# Patient Record
Sex: Female | Born: 1935 | Race: White | Hispanic: No | Smoking: Former smoker
Health system: Southern US, Community
[De-identification: ages and names within clinical notes are randomized; demographics above are authoritative.]

## PROBLEM LIST (undated history)

## (undated) DIAGNOSIS — K219 Gastro-esophageal reflux disease without esophagitis: Secondary | ICD-10-CM

## (undated) DIAGNOSIS — T7840XA Allergy, unspecified, initial encounter: Secondary | ICD-10-CM

## (undated) DIAGNOSIS — I10 Essential (primary) hypertension: Secondary | ICD-10-CM

## (undated) DIAGNOSIS — I8390 Asymptomatic varicose veins of unspecified lower extremity: Secondary | ICD-10-CM

## (undated) DIAGNOSIS — E785 Hyperlipidemia, unspecified: Secondary | ICD-10-CM

## (undated) HISTORY — DX: Allergy, unspecified, initial encounter: T78.40XA

## (undated) HISTORY — DX: Gastro-esophageal reflux disease without esophagitis: K21.9

## (undated) HISTORY — PX: TONSILLECTOMY: SUR1361

## (undated) HISTORY — DX: Essential (primary) hypertension: I10

## (undated) HISTORY — DX: Hyperlipidemia, unspecified: E78.5

## (undated) HISTORY — PX: APPENDECTOMY: SHX54

## (undated) HISTORY — DX: Asymptomatic varicose veins of unspecified lower extremity: I83.90

---

## 2004-08-19 ENCOUNTER — Ambulatory Visit: Payer: Self-pay | Admitting: Family Medicine

## 2004-12-23 ENCOUNTER — Ambulatory Visit: Payer: Self-pay | Admitting: Family Medicine

## 2005-01-23 ENCOUNTER — Ambulatory Visit: Payer: Self-pay | Admitting: Family Medicine

## 2005-05-26 ENCOUNTER — Ambulatory Visit: Payer: Self-pay | Admitting: Family Medicine

## 2005-05-29 ENCOUNTER — Ambulatory Visit: Payer: Self-pay | Admitting: Family Medicine

## 2005-06-09 ENCOUNTER — Ambulatory Visit: Payer: Self-pay | Admitting: Family Medicine

## 2005-06-13 ENCOUNTER — Ambulatory Visit: Payer: Self-pay | Admitting: *Deleted

## 2006-08-09 ENCOUNTER — Ambulatory Visit: Payer: Self-pay | Admitting: Family Medicine

## 2006-08-09 LAB — CONVERTED CEMR LAB
ALT: 41 units/L — ABNORMAL HIGH (ref 0–40)
AST: 33 units/L (ref 0–37)
Albumin: 4.1 g/dL (ref 3.5–5.2)
Alkaline Phosphatase: 44 units/L (ref 39–117)
BUN: 18 mg/dL (ref 6–23)
Basophils Absolute: 0 10*3/uL (ref 0.0–0.1)
Basophils Relative: 0.6 % (ref 0.0–1.0)
Bilirubin, Direct: 0.1 mg/dL (ref 0.0–0.3)
CO2: 33 meq/L — ABNORMAL HIGH (ref 19–32)
Calcium: 9.7 mg/dL (ref 8.4–10.5)
Chloride: 97 meq/L (ref 96–112)
Creatinine, Ser: 0.7 mg/dL (ref 0.4–1.2)
Eosinophil percent: 3.1 % (ref 0.0–5.0)
GFR calc non Af Amer: 86 mL/min
Glomerular Filtration Rate, Af Am: 105 mL/min/{1.73_m2}
Glucose, Bld: 100 mg/dL — ABNORMAL HIGH (ref 70–99)
HCT: 42.9 % (ref 36.0–46.0)
Hemoglobin: 14.3 g/dL (ref 12.0–15.0)
Lymphocytes Relative: 36.5 % (ref 12.0–46.0)
MCHC: 33.4 g/dL (ref 30.0–36.0)
MCV: 93.6 fL (ref 78.0–100.0)
Monocytes Absolute: 0.4 10*3/uL (ref 0.2–0.7)
Monocytes Relative: 7.6 % (ref 3.0–11.0)
Neutro Abs: 3.1 10*3/uL (ref 1.4–7.7)
Neutrophils Relative %: 52.2 % (ref 43.0–77.0)
Platelets: 292 10*3/uL (ref 150–400)
Potassium: 3.9 meq/L (ref 3.5–5.1)
RBC: 4.58 M/uL (ref 3.87–5.11)
RDW: 12.8 % (ref 11.5–14.6)
Sodium: 139 meq/L (ref 135–145)
TSH: 2.76 microintl units/mL (ref 0.35–5.50)
Total Bilirubin: 0.8 mg/dL (ref 0.3–1.2)
Total Protein: 7.4 g/dL (ref 6.0–8.3)
WBC: 5.8 10*3/uL (ref 4.5–10.5)

## 2006-12-18 ENCOUNTER — Ambulatory Visit: Payer: Self-pay | Admitting: Family Medicine

## 2006-12-27 ENCOUNTER — Ambulatory Visit: Payer: Self-pay | Admitting: Family Medicine

## 2007-01-17 ENCOUNTER — Ambulatory Visit: Payer: Self-pay | Admitting: Family Medicine

## 2007-06-11 DIAGNOSIS — I10 Essential (primary) hypertension: Secondary | ICD-10-CM | POA: Insufficient documentation

## 2007-06-11 DIAGNOSIS — K219 Gastro-esophageal reflux disease without esophagitis: Secondary | ICD-10-CM | POA: Insufficient documentation

## 2007-09-16 ENCOUNTER — Ambulatory Visit: Payer: Self-pay | Admitting: Family Medicine

## 2007-09-18 LAB — CONVERTED CEMR LAB
ALT: 26 units/L (ref 0–35)
AST: 27 units/L (ref 0–37)
Albumin: 3.9 g/dL (ref 3.5–5.2)
Alkaline Phosphatase: 49 units/L (ref 39–117)
Amylase: 122 units/L (ref 27–131)
BUN: 18 mg/dL (ref 6–23)
Basophils Absolute: 0 10*3/uL (ref 0.0–0.1)
Basophils Relative: 0.5 % (ref 0.0–1.0)
Bilirubin, Direct: 0.2 mg/dL (ref 0.0–0.3)
CO2: 31 meq/L (ref 19–32)
Calcium: 9.5 mg/dL (ref 8.4–10.5)
Chloride: 97 meq/L (ref 96–112)
Creatinine, Ser: 0.8 mg/dL (ref 0.4–1.2)
Eosinophils Absolute: 0.2 10*3/uL (ref 0.0–0.6)
Eosinophils Relative: 3 % (ref 0.0–5.0)
GFR calc Af Amer: 89 mL/min
GFR calc non Af Amer: 74 mL/min
Glucose, Bld: 97 mg/dL (ref 70–99)
HCT: 39.5 % (ref 36.0–46.0)
Hemoglobin: 13.7 g/dL (ref 12.0–15.0)
Lymphocytes Relative: 29.3 % (ref 12.0–46.0)
MCHC: 34.7 g/dL (ref 30.0–36.0)
MCV: 94.3 fL (ref 78.0–100.0)
Monocytes Absolute: 0.5 10*3/uL (ref 0.2–0.7)
Monocytes Relative: 7 % (ref 3.0–11.0)
Neutro Abs: 4.1 10*3/uL (ref 1.4–7.7)
Neutrophils Relative %: 60.2 % (ref 43.0–77.0)
Platelets: 270 10*3/uL (ref 150–400)
Potassium: 4.2 meq/L (ref 3.5–5.1)
RBC: 4.19 M/uL (ref 3.87–5.11)
RDW: 12.9 % (ref 11.5–14.6)
Sodium: 138 meq/L (ref 135–145)
TSH: 2.06 microintl units/mL (ref 0.35–5.50)
Total Bilirubin: 0.8 mg/dL (ref 0.3–1.2)
Total Protein: 6.8 g/dL (ref 6.0–8.3)
WBC: 6.8 10*3/uL (ref 4.5–10.5)

## 2008-01-22 ENCOUNTER — Encounter: Payer: Self-pay | Admitting: Family Medicine

## 2008-01-29 ENCOUNTER — Ambulatory Visit: Payer: Self-pay | Admitting: Family Medicine

## 2008-01-29 DIAGNOSIS — R609 Edema, unspecified: Secondary | ICD-10-CM | POA: Insufficient documentation

## 2008-03-04 ENCOUNTER — Telehealth: Payer: Self-pay | Admitting: Family Medicine

## 2008-05-06 ENCOUNTER — Ambulatory Visit: Payer: Self-pay | Admitting: Family Medicine

## 2008-05-06 DIAGNOSIS — J309 Allergic rhinitis, unspecified: Secondary | ICD-10-CM | POA: Insufficient documentation

## 2008-08-25 ENCOUNTER — Ambulatory Visit: Payer: Self-pay | Admitting: Family Medicine

## 2008-11-26 LAB — HM COLONOSCOPY

## 2009-03-15 ENCOUNTER — Ambulatory Visit: Payer: Self-pay | Admitting: Family Medicine

## 2009-03-17 ENCOUNTER — Encounter: Payer: Self-pay | Admitting: Family Medicine

## 2009-03-17 LAB — CONVERTED CEMR LAB
ALT: 25 units/L (ref 0–35)
AST: 28 units/L (ref 0–37)
Albumin: 4.3 g/dL (ref 3.5–5.2)
Alkaline Phosphatase: 53 units/L (ref 39–117)
BUN: 21 mg/dL (ref 6–23)
Basophils Absolute: 0.1 10*3/uL (ref 0.0–0.1)
Basophils Relative: 0.9 % (ref 0.0–3.0)
Bilirubin, Direct: 0.1 mg/dL (ref 0.0–0.3)
CO2: 31 meq/L (ref 19–32)
Calcium: 9.3 mg/dL (ref 8.4–10.5)
Chloride: 94 meq/L — ABNORMAL LOW (ref 96–112)
Creatinine, Ser: 0.9 mg/dL (ref 0.4–1.2)
Eosinophils Absolute: 0.3 10*3/uL (ref 0.0–0.7)
Eosinophils Relative: 4.9 % (ref 0.0–5.0)
GFR calc non Af Amer: 64.16 mL/min (ref >60)
Glucose, Bld: 100 mg/dL — ABNORMAL HIGH (ref 70–99)
HCT: 38.7 % (ref 36.0–46.0)
Hemoglobin: 13.9 g/dL (ref 12.0–15.0)
Lymphocytes Relative: 33.6 % (ref 12.0–46.0)
Lymphs Abs: 1.9 10*3/uL (ref 0.7–4.0)
MCHC: 36 g/dL (ref 30.0–36.0)
MCV: 89.1 fL (ref 78.0–100.0)
Monocytes Absolute: 0.4 10*3/uL (ref 0.1–1.0)
Monocytes Relative: 6.6 % (ref 3.0–12.0)
Neutro Abs: 3 10*3/uL (ref 1.4–7.7)
Neutrophils Relative %: 54 % (ref 43.0–77.0)
Platelets: 205 10*3/uL (ref 150.0–400.0)
Potassium: 3.7 meq/L (ref 3.5–5.1)
RBC: 4.34 M/uL (ref 3.87–5.11)
RDW: 13.5 % (ref 11.5–14.6)
Sodium: 131 meq/L — ABNORMAL LOW (ref 135–145)
TSH: 2.62 microintl units/mL (ref 0.35–5.50)
Total Bilirubin: 1.2 mg/dL (ref 0.3–1.2)
Total Protein: 7.5 g/dL (ref 6.0–8.3)
WBC: 5.7 10*3/uL (ref 4.5–10.5)

## 2009-06-02 ENCOUNTER — Encounter: Payer: Self-pay | Admitting: Family Medicine

## 2009-10-11 ENCOUNTER — Telehealth: Payer: Self-pay | Admitting: Family Medicine

## 2009-10-19 ENCOUNTER — Telehealth: Payer: Self-pay | Admitting: Family Medicine

## 2009-11-09 ENCOUNTER — Ambulatory Visit: Payer: Self-pay | Admitting: Family Medicine

## 2010-01-14 ENCOUNTER — Telehealth: Payer: Self-pay | Admitting: Family Medicine

## 2010-03-15 ENCOUNTER — Encounter: Payer: Self-pay | Admitting: Family Medicine

## 2010-04-07 ENCOUNTER — Telehealth: Payer: Self-pay | Admitting: Family Medicine

## 2010-04-19 ENCOUNTER — Encounter: Payer: Self-pay | Admitting: Family Medicine

## 2010-06-27 ENCOUNTER — Ambulatory Visit: Payer: Self-pay | Admitting: Family Medicine

## 2010-06-27 DIAGNOSIS — M129 Arthropathy, unspecified: Secondary | ICD-10-CM | POA: Insufficient documentation

## 2010-06-28 LAB — CONVERTED CEMR LAB
ALT: 20 units/L (ref 0–35)
AST: 22 units/L (ref 0–37)
Albumin: 4.2 g/dL (ref 3.5–5.2)
Alkaline Phosphatase: 52 units/L (ref 39–117)
BUN: 13 mg/dL (ref 6–23)
Basophils Absolute: 0 10*3/uL (ref 0.0–0.1)
Basophils Relative: 0.6 % (ref 0.0–3.0)
Bilirubin, Direct: 0.2 mg/dL (ref 0.0–0.3)
CO2: 32 meq/L (ref 19–32)
Calcium: 9.4 mg/dL (ref 8.4–10.5)
Chloride: 96 meq/L (ref 96–112)
Creatinine, Ser: 0.7 mg/dL (ref 0.4–1.2)
Eosinophils Absolute: 0.2 10*3/uL (ref 0.0–0.7)
Eosinophils Relative: 3.4 % (ref 0.0–5.0)
GFR calc non Af Amer: 81.43 mL/min (ref >60)
Glucose, Bld: 104 mg/dL — ABNORMAL HIGH (ref 70–99)
HCT: 41 % (ref 36.0–46.0)
Hemoglobin: 13.9 g/dL (ref 12.0–15.0)
Lymphocytes Relative: 30.1 % (ref 12.0–46.0)
Lymphs Abs: 2 10*3/uL (ref 0.7–4.0)
MCHC: 34 g/dL (ref 30.0–36.0)
MCV: 94 fL (ref 78.0–100.0)
Monocytes Absolute: 0.5 10*3/uL (ref 0.1–1.0)
Monocytes Relative: 7.4 % (ref 3.0–12.0)
Neutro Abs: 3.9 10*3/uL (ref 1.4–7.7)
Neutrophils Relative %: 58.5 % (ref 43.0–77.0)
Platelets: 260 10*3/uL (ref 150.0–400.0)
Potassium: 4 meq/L (ref 3.5–5.1)
RBC: 4.36 M/uL (ref 3.87–5.11)
RDW: 14.6 % (ref 11.5–14.6)
Sodium: 136 meq/L (ref 135–145)
TSH: 2.51 microintl units/mL (ref 0.35–5.50)
Total Bilirubin: 0.8 mg/dL (ref 0.3–1.2)
Total Protein: 7 g/dL (ref 6.0–8.3)
Uric Acid, Serum: 5.9 mg/dL (ref 2.4–7.0)
WBC: 6.7 10*3/uL (ref 4.5–10.5)

## 2010-07-12 ENCOUNTER — Telehealth: Payer: Self-pay | Admitting: Family Medicine

## 2010-08-16 ENCOUNTER — Ambulatory Visit: Payer: Self-pay | Admitting: Family Medicine

## 2010-08-31 ENCOUNTER — Telehealth: Payer: Self-pay | Admitting: Family Medicine

## 2010-08-31 DIAGNOSIS — M25579 Pain in unspecified ankle and joints of unspecified foot: Secondary | ICD-10-CM | POA: Insufficient documentation

## 2010-09-09 ENCOUNTER — Encounter: Payer: Self-pay | Admitting: Family Medicine

## 2010-10-10 ENCOUNTER — Telehealth: Payer: Self-pay | Admitting: Family Medicine

## 2010-10-11 ENCOUNTER — Telehealth: Payer: Self-pay | Admitting: Family Medicine

## 2010-10-24 ENCOUNTER — Telehealth: Payer: Self-pay | Admitting: Family Medicine

## 2010-11-01 NOTE — Progress Notes (Signed)
Summary: RX  Phone Note Call from Patient   Reason for Call: Talk to Nurse Summary of Call: Patient is requesting something to be called in for same syptoms as her last visit.  (sneezing,watery eyes,dripping sinus).  Pharmacy is [facility] on [address].  NKA Initial call taken by: Everrett Coombe,  July 12, 2010 9:53 AM  Follow-up for Phone Call        call in Augmentin 875 two times a day for 10 days  Follow-up by: Nelwyn Salisbury MD,  July 12, 2010 10:02 AM  Additional Follow-up for Phone Call Additional follow up Details #1::        done  pt aware  Additional Follow-up by: Pura Spice, RN,  July 12, 2010 10:32 AM    New/Updated Medications: AUGMENTIN 875-125 MG TABS (AMOXICILLIN-POT CLAVULANATE) 1 by mouth two times a day for 10 days Prescriptions: AUGMENTIN 875-125 MG TABS (AMOXICILLIN-POT CLAVULANATE) 1 by mouth two times a day for 10 days  #20 x 0   Entered by:   Pura Spice, RN   Authorized by:   Nelwyn Salisbury MD   Signed by:   Pura Spice, RN on 07/12/2010   Method used:   Electronically to        [facility]  [phone]* (retail)       [address]       [place], [place]  [address]       Ph: [phone] or [phone]       Fax: [phone]   RxID:   [id number]

## 2010-11-01 NOTE — Letter (Signed)
Summary: [facility]  [facility]   Imported By: Maryln Gottron 07/09/2009 10:48:47  _____________________________________________________________________  External Attachment:    Type:   Image     Comment:   External Document

## 2010-11-01 NOTE — Assessment & Plan Note (Signed)
Summary: fu on bp/njr  Medications Added NEXIUM 40 MG  CPDR (ESOMEPRAZOLE MAGNESIUM) 1 by mouth once daily BENICAR HCT 40-12.5 MG  TABS (OLMESARTAN MEDOXOMIL-HCTZ) 1 by mouth once daily CRESTOR 10 MG  TABS (ROSUVASTATIN CALCIUM) 1 by mouth once daily ATENOLOL 100 MG  TABS (ATENOLOL) 1 by mouth once daily FUROSEMIDE 40 MG  TABS (FUROSEMIDE) 1 by mouth once daily EVISTA 60 MG  TABS (RALOXIFENE HCL) 1 by mouth once daily KLOR-CON M20 20 MEQ  TBCR (POTASSIUM CHLORIDE CRYS CR) once daily KETOCONAZOLE 2 %  CREA (KETOCONAZOLE) apply two times a day as needed        Vital Signs:  Patient Profile:   77 Years Old Female Weight:      187 pounds Temp:     98.3 degrees F oral Pulse rate:   69 / minute BP sitting:   138 / 82  (left arm) Cuff size:   regular  Vitals Entered By: Alfred Levins, CMA (September 16, 2007 10:28 AM)                 Chief Complaint:  bp check.  History of Present Illness: BP is stable. Having a lot of generalized itching, also some hair loss. Had stopped taking Evista 6 months ago.   Current Allergies: No known allergies      Review of Systems      See HPI   Physical Exam  General:     Well-developed,well-nourished,in no acute distress; alert,appropriate and cooperative throughout examination    Impression & Recommendations:  Problem # 1:  HYPERTENSION (ICD-401.9)  Her updated medication list for this problem includes:    Benicar Hct 40-12.5 Mg Tabs (Olmesartan medoxomil-hctz) .Marland Kitchen... 1 by mouth once daily    Atenolol 100 Mg Tabs (Atenolol) .Marland Kitchen... 1 by mouth once daily    Furosemide 40 Mg Tabs (Furosemide) .Marland Kitchen... 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 1 by mouth once daily   Complete Medication List: 1)  Nexium 40 Mg Cpdr (Esomeprazole magnesium) .Marland Kitchen.. 1 by mouth once daily 2)  Benicar Hct 40-12.5 Mg Tabs (Olmesartan medoxomil-hctz) .Marland Kitchen.. 1 by mouth once daily 3)  Crestor 10 Mg Tabs (Rosuvastatin calcium) .Marland Kitchen.. 1 by mouth once daily 4)  Atenolol 100 Mg Tabs (Atenolol) .Marland Kitchen.. 1 by mouth once daily 5)  Furosemide 40 Mg Tabs (Furosemide) .Marland Kitchen.. 1 by mouth once daily 6)  Klor-con M20 20 Meq Tbcr (Potassium chloride crys cr) .... Once daily 7)  Ketoconazole 2 % Crea (Ketoconazole) .... Apply two times a day as needed   Patient Instructions: 1)  Please schedule a follow-up appointment as needed.    Prescriptions: KLOR-CON M20 20 MEQ  TBCR (POTASSIUM CHLORIDE CRYS CR) once daily  #90 x 3   Entered and Authorized by:   Nelwyn Salisbury MD   Signed by:   Nelwyn Salisbury MD on 09/16/2007   Method used:   Print then Give to Patient   RxID:   [id number] FUROSEMIDE 40 MG  TABS (FUROSEMIDE) 1 by mouth once daily  #90 x 3   Entered and Authorized by:   Nelwyn Salisbury MD   Signed by:  Nelwyn Salisbury MD on 09/16/2007   Method used:   Print then Give to Patient   RxID:   [id number] ATENOLOL 100 MG  TABS (ATENOLOL) 1 by mouth once daily  #90 x 3   Entered and Authorized by:   Nelwyn Salisbury MD   Signed by:   Nelwyn Salisbury MD on 09/16/2007   Method used:   Print then Give to Patient   RxID:   [id number] CRESTOR 10 MG  TABS (ROSUVASTATIN CALCIUM) 1 by mouth once daily  #90 x 3   Entered and Authorized by:   Nelwyn Salisbury MD   Signed by:   Nelwyn Salisbury MD on 09/16/2007   Method used:   Print then Give to Patient   RxID:   [id number] BENICAR HCT 40-12.5 MG  TABS (OLMESARTAN MEDOXOMIL-HCTZ) 1 by mouth once daily  #90 x 3   Entered and Authorized by:   Nelwyn Salisbury MD   Signed  by:   Nelwyn Salisbury MD on 09/16/2007   Method used:   Print then Give to Patient   RxID:   [id number] NEXIUM 40 MG  CPDR (ESOMEPRAZOLE MAGNESIUM) 1 by mouth once daily  #90 x 3   Entered and Authorized by:   Nelwyn Salisbury MD   Signed by:   Nelwyn Salisbury MD on 09/16/2007   Method used:   Print then Give to Patient   RxID:   [id number]  ]

## 2010-11-01 NOTE — Progress Notes (Signed)
Summary: referral to orthropedic   Phone Note Call from Patient Call back at Home Phone [phone]   Caller: Son Summary of Call: she is having more pain and swelling in the left ankle. This has been going on for about 5 months Initial call taken by: Nelwyn Salisbury MD,  August 31, 2010 10:09 AM  Follow-up for Phone Call        refer to Dr. Dorene Grebe for left ankle pain Follow-up by: Nelwyn Salisbury MD,  August 31, 2010 10:09 AM  Additional Follow-up for Phone Call Additional follow up Details #1::        sent to terri  Additional Follow-up by: Pura Spice, RN,  August 31, 2010 11:32 AM  New Problems: ANKLE PAIN (ICD-719.47)   New Problems: ANKLE PAIN (ICD-719.47)

## 2010-11-01 NOTE — Assessment & Plan Note (Signed)
Summary: upper resp infection/mhf   Vital Signs:  Patient Profile:   77 Years Old Female Weight:      188 pounds Temp:     98.2 degrees F oral Pulse rate:   67 / minute BP sitting:   118 / 66  (left arm) Cuff size:   regular  Vitals Entered By: Alfred Levins, CMA (May 06, 2008 11:44 AM)                 Chief Complaint:  uri?Bianca Matthews  History of Present Illness: Here to check BP and allergies. She sees Dr. Navarino Callas regularly and gets weekly shots. Over the past few weeks she has had a bit more stuffy head and PND and dry cough than usual. She wants to know if she has an infection or not. No ST or HA or fever. Otherwise she feels great.     Current Allergies: No known allergies   Past Medical History:    Reviewed history from 06/11/2007 and no changes required:       High cholesterol       LE Edema       GERD       Hypertension       Allergic rhinitis, sees Dr. [place] Callas            Review of Systems  The patient denies anorexia, fever, weight loss, weight gain, vision loss, decreased hearing, hoarseness, chest pain, syncope, dyspnea on exertion, peripheral edema, prolonged cough, headaches, hemoptysis, abdominal pain, melena, hematochezia, severe indigestion/heartburn, hematuria, incontinence, genital sores, muscle weakness, suspicious skin lesions, transient blindness, difficulty walking, depression, unusual weight change, abnormal bleeding, enlarged lymph nodes, angioedema, breast masses, and testicular masses.     Physical Exam  General:     Well-developed,well-nourished,in no acute distress; alert,appropriate and cooperative throughout examination Head:     Normocephalic and atraumatic without obvious abnormalities. No apparent alopecia or balding. Eyes:     No corneal or conjunctival inflammation noted. EOMI. Perrla. Funduscopic exam benign, without hemorrhages, exudates or papilledema. Vision grossly normal. Ears:     External ear exam shows no significant lesions or  deformities.  Otoscopic examination reveals clear canals, tympanic membranes are intact bilaterally without bulging, retraction, inflammation or discharge. Hearing is grossly normal bilaterally. Nose:     External nasal examination shows no deformity or inflammation. Nasal mucosa are pink and moist without lesions or exudates. Mouth:     Oral mucosa and oropharynx without lesions or exudates.  Teeth in good repair. Neck:     No deformities, masses, or tenderness noted. Lungs:     Normal respiratory effort, chest expands symmetrically. Lungs are clear to auscultation, no crackles or wheezes. Heart:     Normal rate and regular rhythm. S1 and S2 normal without gallop, murmur, click, rub or other extra sounds.    Impression & Recommendations:  Problem # 1:  ALLERGIC RHINITIS (ICD-477.9)  Her updated medication list for this problem includes:    Astelin 137 Mcg/spray Soln (Azelastine hcl) .Bianca Matthews..Bianca Matthews Two times a day   Problem # 2:  HYPERTENSION (ICD-401.9)  Her updated medication list for this problem includes:    Benicar Hct 40-12.5 Mg Tabs (Olmesartan medoxomil-hctz) .Bianca Matthews... 1 by mouth once daily    Atenolol 100 Mg Tabs (Atenolol) .Bianca Matthews..Bianca Matthews Two times a day    Furosemide 40 Mg Tabs (Furosemide) .Bianca Matthews..Bianca Matthews Two times a day   Complete Medication List: 1)  Benicar Hct 40-12.5 Mg Tabs (Olmesartan medoxomil-hctz) .Bianca Matthews.. 1 by mouth once daily  2)  Crestor 10 Mg Tabs (Rosuvastatin calcium) .Bianca Matthews.. 1 by mouth once daily 3)  Atenolol 100 Mg Tabs (Atenolol) .... Two times a day 4)  Furosemide 40 Mg Tabs (Furosemide) .... Two times a day 5)  Klor-con M20 20 Meq Tbcr (Potassium chloride crys cr) .... Once daily 6)  Ketoconazole 2 % Crea (Ketoconazole) .... Apply two times a day as needed 7)  Omeprazole 20 Mg Tbec (Omeprazole) .... Once daily 8)  Astelin 137 Mcg/spray Soln (Azelastine hcl) .... Two times a day  Other Orders: Pneumococcal Vaccine ([address]) Admin 1st Vaccine (60454)   Patient Instructions: 1)  She  has no evidence of URI today. I think it is just her typical allergies flalring up. She can follow up with Dr. [place] Callas. HTN is well controlled.   ]  Pneumovax Vaccine    Vaccine Type: Pneumovax    Site: left deltoid    Mfr: Merck    Dose: 0.5 ml    Route: IM    Given by: Alfred Levins, CMA    Exp. Date: 02/14/2009    Lot #: [id number]

## 2010-11-01 NOTE — Progress Notes (Signed)
Summary: refax - omeprazole  Phone Note Refill Request Message from:  Fax from Pharmacy on April 07, 2010 4:47 PM  Refills Requested: Medication #1:  OMEPRAZOLE 40 MG CPDR once daily [facility]   Method Requested: Fax to Local Pharmacy Initial call taken by: Duard Brady LPN,  April 08, 6439 4:47 PM    Prescriptions: OMEPRAZOLE 40 MG CPDR (OMEPRAZOLE) once daily  #90 x 3   Entered by:   Duard Brady LPN   Authorized by:   Nelwyn Salisbury MD   Signed by:   Duard Brady LPN on 34/74/2595   Method used:   Faxed to ...       [facility]* (retail)       [address] [place], [place]  [address]       Ph: [phone]       Fax: [phone]   RxID:   [id number]  refax rx for omeprazole - not rec'd. KIK

## 2010-11-01 NOTE — Progress Notes (Signed)
Summary: rx or samples  Phone Note Call from Patient Call back at Home Phone [phone]   Caller: Patient-live call Summary of Call: Need new rx for Benicar Hct called to [facility] on [address] or samples until mail order arrives. Initial call taken by: Warnell Forester,  October 19, 2009 4:28 PM  Follow-up for Phone Call        med was taken off list on 10/11/09 Follow-up by: Alfred Levins, CMA,  October 19, 2009 5:04 PM  Additional Follow-up for Phone Call Additional follow up Details #1::        she can take samples of Benicar HCT until her mail order Hyzaar comes in Additional Follow-up by: Nelwyn Salisbury MD,  October 19, 2009 5:10 PM    Additional Follow-up for Phone Call Additional follow up Details #2::    samples up front, pt aware Follow-up by: Alfred Levins, CMA,  October 19, 2009 5:12 PM

## 2010-11-01 NOTE — Progress Notes (Signed)
Summary: phone  Phone Note Call from Patient Call back at Home Phone [phone]   Caller: Patient Call For: Nelwyn Salisbury MD Summary of Call: pls call Initial call taken by: VM  Follow-up for Phone Call        [facility] Follow-up by: Raechel Ache, RN,  January 14, 2010 8:29 AM  Additional Follow-up for Phone Call Additional follow up Details #1::        refill Losartan [facility]  fax [phone] Additional Follow-up by: Raechel Ache, RN,  January 14, 2010 11:09 AM    Prescriptions: Mauri Reading 100-12.5 MG TABS (LOSARTAN POTASSIUM-HCTZ) once daily  #90 x 3   Entered by:   Raechel Ache, RN   Authorized by:   Nelwyn Salisbury MD   Signed by:   Raechel Ache, RN on 01/14/2010   Method used:   Faxed to ...       [facility] (mail-order)             , [place]         Ph: [phone]       Fax: [phone]   RxID:   [id number]

## 2010-11-01 NOTE — Miscellaneous (Signed)
Summary: [facility]  [facility]   Imported By: Kassie Mends 02/25/2008 10:15:28  _____________________________________________________________________  External Attachment:    Type:   Image     Comment:   [facility]

## 2010-11-01 NOTE — Progress Notes (Signed)
Summary: change med  Phone Note Call from Patient Call back at Home Phone [phone]   Caller: Patient Call For: Kymber Kosar Summary of Call: wants to change from Benicar HCT to Losartan HCT 90 day rx Initial call taken by: Alfred Levins, CMA,  October 11, 2009 10:46 AM  Follow-up for Phone Call        done Follow-up by: Nelwyn Salisbury MD,  October 11, 2009 3:53 PM  Additional Follow-up for Phone Call Additional follow up Details #1::        crestor 5mg  samples up front and rx ready for p/u, pt aware Additional Follow-up by: Alfred Levins, CMA,  October 12, 2009 10:06 AM    New/Updated Medications: HYZAAR 100-12.5 MG TABS (LOSARTAN POTASSIUM-HCTZ) once daily Prescriptions: CRESTOR 10 MG  TABS (ROSUVASTATIN CALCIUM) 1 by mouth once daily  #90 x 3   Entered by:   Alfred Levins, CMA   Authorized by:   Nelwyn Salisbury MD   Signed by:   Alfred Levins, CMA on 10/12/2009   Method used:   Electronically to        [facility]* (retail)       [address] [place], [place]  [address]       Ph: [phone]       Fax: [phone]   RxID:   [id number] HYZAAR 100-12.5 MG TABS (LOSARTAN POTASSIUM-HCTZ) once daily  #90 x 3   Entered and Authorized by:   Nelwyn Salisbury MD   Signed by:   Nelwyn Salisbury MD on 10/11/2009   Method used:   Print then Give to Patient   RxID:   [id number] CRESTOR 10 MG  TABS (ROSUVASTATIN CALCIUM) 1 by mouth once daily  #90 x 3   Entered by:   Alfred Levins, CMA   Authorized by:   Nelwyn Salisbury MD   Signed by:   Alfred Levins, CMA on 10/11/2009   Method used:   Electronically to        [facility]* (retail)       [address]       [place], [place]  [address]       Ph: [phone]       Fax: [phone]   RxID:   [id number]

## 2010-11-01 NOTE — Progress Notes (Signed)
Summary: change Atenolol dose?  Phone Note Call from Patient   Caller: Patient Call For: Dr. Clent Ridges Summary of Call: Pt felt better on Atenolol 100 mg. one two times a day and was decreased to one daily.  Can she go back to two times a day? [phone] Initial call taken by: Lynann Beaver CMA,  March 04, 2008 2:41 PM  Follow-up for Phone Call        yes increase to two times a day . I wrote a new rx for mail off Follow-up by: Nelwyn Salisbury MD,  March 05, 2008 8:28 AM  Additional Follow-up for Phone Call Additional follow up Details #1::        rx up front, pt aware Additional Follow-up by: Alfred Levins, CMA,  March 05, 2008 9:30 AM    New/Updated Medications: ATENOLOL 100 MG  TABS (ATENOLOL) two times a day   Prescriptions: ATENOLOL 100 MG  TABS (ATENOLOL) two times a day  #180 x 3   Entered and Authorized by:   Nelwyn Salisbury MD   Signed by:   Nelwyn Salisbury MD on 03/05/2008   Method used:   Print then Give to Patient   RxID:   [id number]

## 2010-11-01 NOTE — Assessment & Plan Note (Signed)
Summary: STOMACH PROBLEM/JLS   Vital Signs:  Patient Profile:   77 Years Old Female Weight:      183 pounds Temp:     97.9 degrees F oral Pulse rate:   64 / minute Pulse rhythm:   regular BP sitting:   114 / 62  (left arm) Cuff size:   large  Vitals Entered By: Alfred Levins, CMA (August 25, 2008 11:07 AM)                 Chief Complaint:  h/a and stomach ache.  History of Present Illness: 2 weeks of sinus pressure, HA, PND, and ST. No fever or cough. Has been very nauseated but no vomiting.  saw Dr. Carmi Callas last week who gave her 5 days of Levaquin and a prednisone shot. These did not help. drinking fluids.     Current Allergies (reviewed today): No known allergies   Past Medical History:    Reviewed history from 05/06/2008 and no changes required:       High cholesterol       LE Edema       GERD       Hypertension       Allergic rhinitis, sees Dr. [place] Callas            Review of Systems  The patient denies anorexia, fever, weight loss, weight gain, vision loss, decreased hearing, hoarseness, chest pain, syncope, dyspnea on exertion, peripheral edema, prolonged cough, hemoptysis, abdominal pain, melena, hematochezia, severe indigestion/heartburn, hematuria, incontinence, genital sores, muscle weakness, suspicious skin lesions, transient blindness, difficulty walking, depression, unusual weight change, abnormal bleeding, enlarged lymph nodes, angioedema, breast masses, and testicular masses.     Physical Exam  General:     Well-developed,well-nourished,in no acute distress; alert,appropriate and cooperative throughout examination Head:     Normocephalic and atraumatic without obvious abnormalities. No apparent alopecia or balding. Eyes:     No corneal or conjunctival inflammation noted. EOMI. Perrla. Funduscopic exam benign, without hemorrhages, exudates or papilledema. Vision grossly normal. Ears:     External ear exam shows no significant lesions or deformities.   Otoscopic examination reveals clear canals, tympanic membranes are intact bilaterally without bulging, retraction, inflammation or discharge. Hearing is grossly normal bilaterally. Nose:     External nasal examination shows no deformity or inflammation. Nasal mucosa are pink and moist without lesions or exudates. Mouth:     Oral mucosa and oropharynx without lesions or exudates.  Teeth in good repair. Neck:     No deformities, masses, or tenderness noted. Lungs:     Normal respiratory effort, chest expands symmetrically. Lungs are clear to auscultation, no crackles or wheezes.    Impression & Recommendations:  Problem # 1:  ACUTE SINUSITIS, UNSPECIFIED (ICD-461.9)  Her updated medication list for this problem includes:    Astelin 137 Mcg/spray Soln (Azelastine hcl) .Marland Kitchen..Marland Kitchen Two times a day    Augmentin 875-125 Mg Tabs (Amoxicillin-pot clavulanate) .Marland Kitchen..Marland Kitchen Two times a day   Complete Medication List: 1)  Benicar Hct 40-12.5 Mg Tabs (Olmesartan medoxomil-hctz) .Marland Kitchen.. 1 by mouth once daily 2)  Crestor 10 Mg Tabs (Rosuvastatin calcium) .Marland Kitchen.. 1 by mouth once daily 3)  Atenolol 100 Mg Tabs (Atenolol) .... Two times a day 4)  Furosemide 40 Mg Tabs (Furosemide) .... Two times a day 5)  Klor-con M20 20 Meq Tbcr (Potassium chloride crys cr) .... Once daily 6)  Ketoconazole 2 % Crea (Ketoconazole) .... Apply two times a day as needed 7)  Astelin 137 Mcg/spray Soln (  Azelastine hcl) .... Two times a day 8)  Promethazine Hcl 25 Mg Tabs (Promethazine hcl) .Marland Kitchen.. 1 every 4 hours as needed nausea 9)  Augmentin 875-125 Mg Tabs (Amoxicillin-pot clavulanate) .... Two times a day 10)  Nexium 40 Mg Cpdr (Esomeprazole magnesium) .... Once daily   Patient Instructions: 1)  Please schedule a follow-up appointment as needed.   Prescriptions: NEXIUM 40 MG CPDR (ESOMEPRAZOLE MAGNESIUM) once daily  #90 x 3   Entered and Authorized by:   Nelwyn Salisbury MD   Signed by:   Nelwyn Salisbury MD on 08/25/2008   Method used:    Print then Give to Patient   RxID:   [id number] FUROSEMIDE 40 MG  TABS (FUROSEMIDE) two times a day  #180 x 3   Entered and Authorized by:   Nelwyn Salisbury MD   Signed by:   Nelwyn Salisbury MD on 08/25/2008   Method used:   Print then Give to Patient   RxID:   [id number] ATENOLOL 100 MG  TABS (ATENOLOL) two times a day  #180 x 3   Entered and Authorized by:   Nelwyn Salisbury MD   Signed by:   Nelwyn Salisbury MD on 08/25/2008   Method used:   Print then Give to Patient   RxID:   [id number] CRESTOR 10 MG  TABS (ROSUVASTATIN CALCIUM) 1 by mouth once daily  #90 x 3   Entered and Authorized by:   Nelwyn Salisbury MD   Signed by:   Nelwyn Salisbury MD on 08/25/2008   Method used:   Print then Give to Patient   RxID:   [id number] BENICAR HCT 40-12.5 MG  TABS (OLMESARTAN MEDOXOMIL-HCTZ) 1 by mouth once daily  #90 x 3   Entered and Authorized by:   Nelwyn Salisbury MD   Signed by:   Nelwyn Salisbury MD on 08/25/2008   Method used:   Print then Give to Patient   RxID:   [id number] AUGMENTIN 875-125 MG TABS (AMOXICILLIN-POT CLAVULANATE) two times a day  #20 x 0   Entered and Authorized by:   Nelwyn Salisbury MD   Signed by:   Nelwyn Salisbury MD on 08/25/2008   Method used:   Electronically to        [facility]  [phone]* (retail)       [address]       [place], [place]  [address]       Ph: [phone] or [phone]       Fax: [phone]   RxID:   [id number] PROMETHAZINE HCL 25 MG TABS (PROMETHAZINE HCL) 1 every 4 hours as needed nausea  #60 x 2   Entered and Authorized by:   Nelwyn Salisbury MD   Signed by:   Nelwyn Salisbury MD on 08/25/2008   Method used:   Electronically to        [facility]  [phone]* (retail)       [address]       [place], [place]  [address]       Ph: [phone] or [phone]       Fax: [phone]   RxID:   [id number]  ]

## 2010-11-01 NOTE — Letter (Signed)
Summary: [facility]  [facility]   Imported By: Maryln Gottron 04/27/2010 15:33:00  _____________________________________________________________________  External Attachment:    Type:   Image     Comment:   External Document

## 2010-11-01 NOTE — Assessment & Plan Note (Signed)
Summary: swelling in leg/mhf   Vital Signs:  Patient Profile:   77 Years Old Female Weight:      188 pounds Temp:     98.2 degrees F oral Pulse rate:   72 / minute Pulse rhythm:   regular BP sitting:   142 / 76  (left arm) Cuff size:   large  Vitals Entered By: Alfred Levins, CMA (January 29, 2008 11:32 AM)                 Chief Complaint:  legs swollen.  History of Present Illness: Still having swelling in the feet despite wearing support hose. Also her insurance wants her to switch to a generic PPI.     Current Allergies: No known allergies   Past Medical History:    Reviewed history from 06/11/2007 and no changes required:       High cholesterol       LE Edema       GERD       Hypertension     Review of Systems      See HPI   Physical Exam  General:     Well-developed,well-nourished,in no acute distress; alert,appropriate and cooperative throughout examination Extremities:     2+ left pedal edema and 2+ right pedal edema.      Impression & Recommendations:  Problem # 1:  HYPERTENSION (ICD-401.9)  Her updated medication list for this problem includes:    Benicar Hct 40-12.5 Mg Tabs (Olmesartan medoxomil-hctz) .Marland Kitchen... 1 by mouth once daily    Atenolol 100 Mg Tabs (Atenolol) .Marland Kitchen... 1 by mouth once daily    Furosemide 40 Mg Tabs (Furosemide) .Marland Kitchen..Marland Kitchen Two times a day   Problem # 2:  GERD (ICD-530.81)  The following medications were removed from the medication list:    Nexium 40 Mg Cpdr (Esomeprazole magnesium) .Marland Kitchen... 1 by mouth once daily  Her updated medication list for this problem includes:    Omeprazole 20 Mg Tbec (Omeprazole) ..... Once daily   Problem # 3:  DEPENDENT EDEMA (ICD-782.3)  Her updated medication list for this problem includes:    Benicar Hct 40-12.5 Mg Tabs (Olmesartan medoxomil-hctz) .Marland Kitchen... 1 by mouth once daily    Furosemide 40 Mg Tabs (Furosemide) .Marland Kitchen..Marland Kitchen Two times a day   Complete Medication List: 1)  Benicar Hct 40-12.5 Mg Tabs  (Olmesartan medoxomil-hctz) .Marland Kitchen.. 1 by mouth once daily 2)  Crestor 10 Mg Tabs (Rosuvastatin calcium) .Marland Kitchen.. 1 by mouth once daily 3)  Atenolol 100 Mg Tabs (Atenolol) .Marland Kitchen.. 1 by mouth once daily 4)  Furosemide 40 Mg Tabs (Furosemide) .... Two times a day 5)  Klor-con M20 20 Meq Tbcr (Potassium chloride crys cr) .... Once daily 6)  Ketoconazole 2 % Crea (Ketoconazole) .... Apply two times a day as needed 7)  Omeprazole 20 Mg Tbec (Omeprazole) .... Once daily   Patient Instructions: 1)  Please schedule a follow-up appointment as needed.    Prescriptions: OMEPRAZOLE 20 MG  TBEC (OMEPRAZOLE) once daily  #90 x 3   Entered and Authorized by:   Nelwyn Salisbury MD   Signed by:   Nelwyn Salisbury MD on 01/29/2008   Method used:   Print then Give to Patient   RxID:   [id number] FUROSEMIDE 40 MG  TABS (FUROSEMIDE) two times a day  #180 x 3   Entered and Authorized by:   Nelwyn Salisbury MD   Signed by:   Nelwyn Salisbury MD on 01/29/2008   Method used:  Print then Give to Patient   RxID:   [id number]  ]

## 2010-11-01 NOTE — Assessment & Plan Note (Signed)
Summary: cough//ccm   Vital Signs:  Patient profile:   77 year old female Weight:      184.5 pounds O2 Sat:      97 % Temp:     97.5 degrees F Pulse rate:   64 / minute BP sitting:   150 / 82  (left arm) Cuff size:   large  Vitals Entered By: Pura Spice, RN (June 27, 2010 9:54 AM) CC: cough runny nose sore throat    History of Present Illness: Here for a BP check and also for pains and stiffness in the left elbow and knee and ankle for the past 6 months. No erythema or warmth. Tylenol helps a little. Her BP is stable.   Allergies (verified): No Known Drug Allergies  Past History:  Past Medical History: High cholesterol LE Edema GERD Hypertension Allergic rhinitis, sees Dr. [place] Callas sees Dr. Arelia Sneddon for GYN exams  Past Surgical History: Reviewed history from 06/11/2007 and no changes required. Tonsillectomy Appendectomy CB x3  Review of Systems  The patient denies anorexia, fever, weight loss, weight gain, vision loss, decreased hearing, hoarseness, chest pain, syncope, dyspnea on exertion, peripheral edema, prolonged cough, headaches, hemoptysis, abdominal pain, melena, hematochezia, severe indigestion/heartburn, hematuria, incontinence, genital sores, muscle weakness, suspicious skin lesions, transient blindness, difficulty walking, depression, unusual weight change, abnormal bleeding, enlarged lymph nodes, angioedema, breast masses, and testicular masses.         Flu Vaccine Consent Questions     Do you have a history of severe allergic reactions to this vaccine? no    Any prior history of allergic reactions to egg and/or gelatin? no    Do you have a sensitivity to the preservative Thimersol? no    Do you have a past history of Guillan-Barre Syndrome? no    Do you currently have an acute febrile illness? no    Have you ever had a severe reaction to latex? no    Vaccine information given and explained to patient? yes    Are you currently pregnant? no    Lot  Number:[id number]   Exp Date:04/01/2011   Site Given  Left Deltoid IM Pura Spice, RN  June 27, 2010 10:49 AM   Physical Exam  General:  Well-developed,well-nourished,in no acute distress; alert,appropriate and cooperative throughout examination Lungs:  Normal respiratory effort, chest expands symmetrically. Lungs are clear to auscultation, no crackles or wheezes. Heart:  Normal rate and regular rhythm. S1 and S2 normal without gallop, murmur, click, rub or other extra sounds. Msk:  the left knee is not tender and has full ROM. The left lateral ankle is swollen and tender. Not red or warm.    Impression & Recommendations:  Problem # 1:  ARTHRITIS, GENERALIZED (ICD-716.99)  Orders: Venipuncture (16109) TLB-BMP (Basic Metabolic Panel-BMET) (80048-METABOL) TLB-CBC Platelet - w/Differential (85025-CBCD) TLB-Hepatic/Liver Function Pnl (80076-HEPATIC) TLB-TSH (Thyroid Stimulating Hormone) (84443-TSH) TLB-Uric Acid, Blood (84550-URIC) Specimen Handling (60454)  Problem # 2:  DEPENDENT EDEMA (ICD-782.3)  Her updated medication list for this problem includes:    Furosemide 40 Mg Tabs (Furosemide) .Marland Kitchen..Marland Kitchen Two times a day    Hyzaar 100-12.5 Mg Tabs (Losartan potassium-hctz) ..... Once daily  Problem # 3:  HYPERTENSION (ICD-401.9)  Her updated medication list for this problem includes:    Atenolol 100 Mg Tabs (Atenolol) .Marland Kitchen..Marland Kitchen Two times a day    Furosemide 40 Mg Tabs (Furosemide) .Marland Kitchen..Marland Kitchen Two times a day    Hyzaar 100-12.5 Mg Tabs (Losartan potassium-hctz) ..... Once daily  Orders: Venipuncture (09811) TLB-BMP (  Basic Metabolic Panel-BMET) (80048-METABOL) TLB-CBC Platelet - w/Differential (85025-CBCD) TLB-Hepatic/Liver Function Pnl (80076-HEPATIC) TLB-TSH (Thyroid Stimulating Hormone) (84443-TSH) Specimen Handling ([address]147)  Problem # 4:  ACUTE SINUSITIS, UNSPECIFIED (ICD-461.9)  The following medications were removed from the medication list:    Astelin 137 Mcg/spray Soln  (Azelastine hcl) .Marland Kitchen..Marland Kitchen Two times a day Her updated medication list for this problem includes:    Zithromax Z-pak 250 Mg Tabs (Azithromycin) .Marland Kitchen... As directed  Complete Medication List: 1)  Crestor 10 Mg Tabs (Rosuvastatin calcium) .Marland Kitchen.. 1 by mouth once daily 2)  Atenolol 100 Mg Tabs (Atenolol) .... Two times a day 3)  Furosemide 40 Mg Tabs (Furosemide) .... Two times a day 4)  Klor-con M20 20 Meq Tbcr (Potassium chloride crys cr) .... Once daily 5)  Promethazine Hcl 25 Mg Tabs (Promethazine hcl) .Marland Kitchen.. 1 every 4 hours as needed nausea 6)  Omeprazole 40 Mg Cpdr (Omeprazole) .... Once daily 7)  Hyzaar 100-12.5 Mg Tabs (Losartan potassium-hctz) .... Once daily 8)  Fexofenadine Hcl 180 Mg Tabs (Fexofenadine hcl) 9)  Clobetasol Propionate 0.05 % Crea (Clobetasol propionate) 10)  Zithromax Z-pak 250 Mg Tabs (Azithromycin) .... As directed  Other Orders: Flu Vaccine 10yrs + MEDICARE PATIENTS (W2956) Administration Flu vaccine - MCR (O1308)  Patient Instructions: 1)  Get labs. Try Aleeve two times a day  Prescriptions: ZITHROMAX Z-PAK 250 MG TABS (AZITHROMYCIN) as directed  #1 x 0   Entered and Authorized by:   Nelwyn Salisbury MD   Signed by:   Nelwyn Salisbury MD on 06/27/2010   Method used:   Electronically to        [facility]  [phone]* (retail)       [address]       [place], [place]  [address]       Ph: [phone] or [phone]       Fax: [phone]   RxID:   [id number]

## 2010-11-01 NOTE — Assessment & Plan Note (Signed)
Summary: SINUSITIS // RS   Vital Signs:  Patient profile:   77 year old female Weight:      192 pounds Temp:     98.3 degrees F oral Pulse rate:   69 / minute BP sitting:   138 / 62  (left arm) Cuff size:   large  Vitals Entered By: Alfred Levins, CMA (November 09, 2009 10:32 AM) CC: muscle spasms in arms, cough, st x2 days   History of Present Illness: Here for 2 days of PND, ST, chest tightness, and coughing up green sputum. She had a fever the first day but not now.   Allergies: No Known Drug Allergies  Past History:  Past Medical History: Reviewed history from 05/06/2008 and no changes required. High cholesterol LE Edema GERD Hypertension Allergic rhinitis, sees Dr. [place] Callas  Review of Systems  The patient denies anorexia, weight loss, weight gain, vision loss, decreased hearing, hoarseness, chest pain, syncope, dyspnea on exertion, peripheral edema, headaches, hemoptysis, abdominal pain, melena, hematochezia, severe indigestion/heartburn, hematuria, incontinence, genital sores, muscle weakness, suspicious skin lesions, transient blindness, difficulty walking, depression, unusual weight change, abnormal bleeding, enlarged lymph nodes, angioedema, breast masses, and testicular masses.    Physical Exam  General:  Well-developed,well-nourished,in no acute distress; alert,appropriate and cooperative throughout examination Head:  Normocephalic and atraumatic without obvious abnormalities. No apparent alopecia or balding. Eyes:  No corneal or conjunctival inflammation noted. EOMI. Perrla. Funduscopic exam benign, without hemorrhages, exudates or papilledema. Vision grossly normal. Ears:  External ear exam shows no significant lesions or deformities.  Otoscopic examination reveals clear canals, tympanic membranes are intact bilaterally without bulging, retraction, inflammation or discharge. Hearing is grossly normal bilaterally. Nose:  External nasal examination shows no deformity  or inflammation. Nasal mucosa are pink and moist without lesions or exudates. Mouth:  Oral mucosa and oropharynx without lesions or exudates.  Teeth in good repair. Neck:  No deformities, masses, or tenderness noted. Lungs:  scattered rhonchi only   Impression & Recommendations:  Problem # 1:  ACUTE BRONCHITIS (ICD-466.0)  Her updated medication list for this problem includes:    Zithromax Z-pak 250 Mg Tabs (Azithromycin) .Marland Kitchen... As directed  Complete Medication List: 1)  Crestor 10 Mg Tabs (Rosuvastatin calcium) .Marland Kitchen.. 1 by mouth once daily 2)  Atenolol 100 Mg Tabs (Atenolol) .... Two times a day 3)  Furosemide 40 Mg Tabs (Furosemide) .... Two times a day 4)  Klor-con M20 20 Meq Tbcr (Potassium chloride crys cr) .... Once daily 5)  Ketoconazole 2 % Crea (Ketoconazole) .... Apply two times a day as needed 6)  Astelin 137 Mcg/spray Soln (Azelastine hcl) .... Two times a day 7)  Promethazine Hcl 25 Mg Tabs (Promethazine hcl) .Marland Kitchen.. 1 every 4 hours as needed nausea 8)  Omeprazole 40 Mg Cpdr (Omeprazole) .... Once daily 9)  Hyzaar 100-12.5 Mg Tabs (Losartan potassium-hctz) .... Once daily 10)  Zithromax Z-pak 250 Mg Tabs (Azithromycin) .... As directed  Patient Instructions: 1)  Please schedule a follow-up appointment as needed .  Prescriptions: ZITHROMAX Z-PAK 250 MG TABS (AZITHROMYCIN) as directed  #1 x 0   Entered and Authorized by:   Nelwyn Salisbury MD   Signed by:   Nelwyn Salisbury MD on 11/09/2009   Method used:   Electronically to        [facility]  [phone]* (retail)       [address] [facility],  [place]  [address]       Ph: [phone] or [phone]       Fax: [phone]   RxID:   [id number]

## 2010-11-01 NOTE — Assessment & Plan Note (Signed)
Summary: SORE THROAT // RS   Vital Signs:  Patient profile:   77 year old female Weight:      188 pounds O2 Sat:      93 % Temp:     97.9 degrees F Pulse rate:   65 / minute BP sitting:   120 / 74  (left arm)  Vitals Entered By: Pura Spice, RN (August 16, 2010 10:26 AM) CC: wants to discuss 2 diuretics that she on gets up alot at nite takes furosemide at 4 pm.  c/o sore throat   History of Present Illness: Here for recurrent symptoms of sinus pressure, PND, and ST. No cough or fever. She took a course of Augmentin a few weeks ago, and this helped a lot. Then her symptoms slowly returned.   Allergies: No Known Drug Allergies  Past History:  Past Medical History: Reviewed history from 06/27/2010 and no changes required. High cholesterol LE Edema GERD Hypertension Allergic rhinitis, sees Dr. Esmeralda Callas sees Dr. Arelia Sneddon for GYN exams  Review of Systems  The patient denies anorexia, fever, weight loss, weight gain, vision loss, decreased hearing, hoarseness, chest pain, syncope, dyspnea on exertion, peripheral edema, prolonged cough, hemoptysis, abdominal pain, melena, hematochezia, severe indigestion/heartburn, hematuria, incontinence, genital sores, muscle weakness, suspicious skin lesions, transient blindness, difficulty walking, depression, unusual weight change, abnormal bleeding, enlarged lymph nodes, angioedema, breast masses, and testicular masses.    Physical Exam  General:  Well-developed,well-nourished,in no acute distress; alert,appropriate and cooperative throughout examination Head:  Normocephalic and atraumatic without obvious abnormalities. No apparent alopecia or balding. Eyes:  No corneal or conjunctival inflammation noted. EOMI. Perrla. Funduscopic exam benign, without hemorrhages, exudates or papilledema. Vision grossly normal. Ears:  External ear exam shows no significant lesions or deformities.  Otoscopic examination reveals clear canals, tympanic membranes  are intact bilaterally without bulging, retraction, inflammation or discharge. Hearing is grossly normal bilaterally. Nose:  External nasal examination shows no deformity or inflammation. Nasal mucosa are pink and moist without lesions or exudates. Mouth:  Oral mucosa and oropharynx without lesions or exudates.  Teeth in good repair. Neck:  No deformities, masses, or tenderness noted. Lungs:  Normal respiratory effort, chest expands symmetrically. Lungs are clear to auscultation, no crackles or wheezes.   Impression & Recommendations:  Problem # 1:  ACUTE SINUSITIS, UNSPECIFIED (ICD-461.9)  The following medications were removed from the medication list:    Augmentin 875-125 Mg Tabs (Amoxicillin-pot clavulanate) .Marland Kitchen... 1 by mouth two times a day for 10 days Her updated medication list for this problem includes:    Augmentin 875-125 Mg Tabs (Amoxicillin-pot clavulanate) .Marland Kitchen..Marland Kitchen Two times a day  Complete Medication List: 1)  Crestor 10 Mg Tabs (Rosuvastatin calcium) .Marland Kitchen.. 1 by mouth once daily 2)  Atenolol 100 Mg Tabs (Atenolol) .... Two times a day 3)  Furosemide 40 Mg Tabs (Furosemide) .... Two times a day 4)  Klor-con M20 20 Meq Tbcr (Potassium chloride crys cr) .... Once daily 5)  Promethazine Hcl 25 Mg Tabs (Promethazine hcl) .Marland Kitchen.. 1 every 4 hours as needed nausea 6)  Omeprazole 40 Mg Cpdr (Omeprazole) .... Once daily 7)  Hyzaar 100-12.5 Mg Tabs (Losartan potassium-hctz) .... Once daily 8)  Fexofenadine Hcl 180 Mg Tabs (Fexofenadine hcl) 9)  Clobetasol Propionate 0.05 % Crea (Clobetasol propionate) 10)  Augmentin 875-125 Mg Tabs (Amoxicillin-pot clavulanate) .... Two times a day  Patient Instructions: 1)  we will try a full 14 day course of Augmentin to totally eradicate the infection.  2)  Please schedule  a follow-up appointment as needed .  Prescriptions: AUGMENTIN 875-125 MG TABS (AMOXICILLIN-POT CLAVULANATE) two times a day  #28 x 0   Entered and Authorized by:   Nelwyn Salisbury MD    Signed by:   Nelwyn Salisbury MD on 08/16/2010   Method used:   Electronically to        [facility]  [phone]* (retail)       [address]       [place], [place]  [address]       Ph: [phone] or [phone]       Fax: [phone]   RxID:   [id number]    Orders Added: 1)  Est. Patient Level IV [25956]

## 2010-11-01 NOTE — Letter (Signed)
Summary: Lipid Letter  [place] [facility]  [address] [place], [place] [address]   Phone: [phone]  Fax: [phone]    03/17/2009  Bianca Matthews [address] [place], [place]  [address]  Dear Ms. Hannibal:  We have carefully reviewed your last lipid profile from  and the results are noted below with a summary of recommendations for lipid management.    Cholesterol:           Goal: <   HDL "good" Cholesterol:       Goal: >   LDL "bad" Cholesterol:       Goal: <   Triglycerides:           Goal: <    Labs are normal.    TLC Diet (Therapeutic Lifestyle Change): Saturated Fats & Transfatty acids should be kept < 7% of total calories ***Reduce Saturated Fats Polyunstaurated Fat can be up to 10% of total calories Monounsaturated Fat Fat can be up to 20% of total calories Total Fat should be no greater than 25-35% of total calories Carbohydrates should be 50-60% of total calories Protein should be approximately 15% of total calories Fiber should be at least 20-30 grams a day ***Increased fiber may help lower LDL Total Cholesterol should be < 200mg /day Consider adding plant stanol/sterols to diet (example: Benacol spread) ***A higher intake of unsaturated fat may reduce Triglycerides and Increase HDL    Adjunctive Measures (may lower LIPIDS and reduce risk of Heart Attack) include: Aerobic Exercise (20-30 minutes 3-4 times a week) Limit Alcohol Consumption Weight Reduction Aspirin 75-81 mg a day by mouth (if not allergic or contraindicated) Dietary Fiber 20-30 grams a day by mouth   If you have any questions, please call. We appreciate being able to work with you.   Sincerely,    [place] [facility]

## 2010-11-03 NOTE — Progress Notes (Signed)
Summary: [facility]   Phone Note From Pharmacy   Caller: [facility] fax [phone] Summary of Call: refill request crestor 10 mg ,furosemide 40mg   and pot micro  Initial call taken by: Pura Spice, RN,  October 10, 2010 7:55 AM  Follow-up for Phone Call        call in one year supply of each  Follow-up by: Nelwyn Salisbury MD,  October 10, 2010 2:31 PM  Additional Follow-up for Phone Call Additional follow up Details #1::        done [facility]ed to [facility] Additional Follow-up by: Pura Spice, RN,  October 11, 2010 8:56 AM    New/Updated Medications: CRESTOR 10 MG  TABS (ROSUVASTATIN CALCIUM) 1 by mouth once daily FUROSEMIDE 40 MG  TABS (FUROSEMIDE) two times a day KLOR-CON M20 20 MEQ  TBCR (POTASSIUM CHLORIDE CRYS CR) once daily Prescriptions: CRESTOR 10 MG  TABS (ROSUVASTATIN CALCIUM) 1 by mouth once daily  #90 x 3   Entered by:   Pura Spice, RN   Authorized by:   Nelwyn Salisbury MD   Signed by:   Pura Spice, RN on 10/11/2010   Method used:   Print then Give to Patient   RxID:   [id number] FUROSEMIDE 40 MG  TABS (FUROSEMIDE) two times a day  #180 x 3   Entered by:   Pura Spice, RN   Authorized by:   Nelwyn Salisbury MD   Signed by:   Pura Spice, RN on 10/11/2010   Method used:   Print then Give to Patient   RxID:   [id number] KLOR-CON M20 20 MEQ  TBCR (POTASSIUM CHLORIDE CRYS CR) once daily  #90 x 1   Entered by:   Pura Spice, RN   Authorized by:   Nelwyn Salisbury MD   Signed by:   Pura Spice, RN on 10/11/2010   Method used:   Print then Give to Patient   RxID:   [id number]

## 2010-11-03 NOTE — Letter (Signed)
Summary: [facility]   Imported By: Maryln Gottron 10/05/2010 13:51:42  _____________________________________________________________________  External Attachment:    Type:   Image     Comment:   External Document

## 2010-11-03 NOTE — Progress Notes (Signed)
Summary: rx atenolol   Phone Note From Pharmacy   Caller: [facility] Call For: Gershon Crane MD  Details for Reason: refill Summary of Call: refill atenolol tab 100mg . 1 tab two times a day.   Follow-up for Phone Call        call in #180 with 3 rf Follow-up by: Nelwyn Salisbury MD,  October 11, 2010 2:39 PM  Additional Follow-up for Phone Call Additional follow up Details #1::        done  Additional Follow-up by: Pura Spice, RN,  October 11, 2010 4:01 PM    New/Updated Medications: ATENOLOL 100 MG  TABS (ATENOLOL) two times a day Prescriptions: ATENOLOL 100 MG  TABS (ATENOLOL) two times a day  #180 x 3   Entered by:   Pura Spice, RN   Authorized by:   Nelwyn Salisbury MD   Signed by:   Pura Spice, RN on 10/11/2010   Method used:   Faxed to ...       [facility]* (retail)       [address] [place], [place]  [address]       Ph: [phone]       Fax: [phone]   RxID:   [id number]

## 2010-11-03 NOTE — Progress Notes (Signed)
Summary: refills crestor furosemide potassium   Phone Note Refill Request Message from:  Patient----live call  Refills Requested: Medication #1:  CRESTOR 10 MG  TABS 1 by mouth once daily send to [facility]----fax---[phone]  Initial call taken by: Warnell Forester,  October 24, 2010 9:46 AM  Follow-up for Phone Call        left mess to return call . was given rx on 10-13-10 Follow-up by: Pura Spice, RN,  October 24, 2010 9:59 AM  Additional Follow-up for Phone Call Additional follow up Details #1::        spoke with pt needs refill crestor furosemide and potassium to [facility].  faxed to [facility] Additional Follow-up by: Pura Spice, RN,  October 24, 2010 3:21 PM    New/Updated Medications: FUROSEMIDE 40 MG  TABS (FUROSEMIDE) two times a day Prescriptions: KLOR-CON M20 20 MEQ  TBCR (POTASSIUM CHLORIDE CRYS CR) once daily  #90 x 0   Entered by:   Pura Spice, RN   Authorized by:   Nelwyn Salisbury MD   Signed by:   Pura Spice, RN on 10/24/2010   Method used:   Faxed to ...       [facility] (mail-order)             , [place]         Ph: [phone]       Fax: [phone]   RxID:   [id number] FUROSEMIDE 40 MG  TABS (FUROSEMIDE) two times a day  #180 x 0   Entered by:   Pura Spice, RN   Authorized by:   Nelwyn Salisbury MD   Signed by:   Pura Spice, RN on 10/24/2010   Method used:   Faxed to ...       [facility] (mail-order)             , [place]         Ph: [phone]       Fax: [phone]   RxID:   [id number] CRESTOR 10 MG  TABS (ROSUVASTATIN CALCIUM) 1 by mouth once daily  #90 x 0   Entered by:   Pura Spice, RN   Authorized by:   Nelwyn Salisbury MD   Signed by:   Pura Spice, RN on 10/24/2010   Method used:   Faxed to ...       [facility] (mail-order)             , [place]         Ph: [phone]       Fax: [phone]   RxID:   [id number]

## 2010-12-16 ENCOUNTER — Other Ambulatory Visit: Payer: Self-pay

## 2010-12-16 MED ORDER — ROSUVASTATIN CALCIUM 10 MG PO TABS: 10.0000 mg | ORAL_TABLET | Freq: Every day | ORAL | Status: DC

## 2010-12-16 MED ORDER — LOSARTAN POTASSIUM-HCTZ 100-25 MG PO TABS: 1.0000 | ORAL_TABLET | Freq: Every day | ORAL | Status: DC

## 2010-12-16 NOTE — Telephone Encounter (Signed)
Refill losart/hct 100-12.5  Faxed to [facility].

## 2010-12-16 NOTE — Telephone Encounter (Signed)
Faxed rx crestor to [facility]

## 2011-02-06 ENCOUNTER — Telehealth: Payer: Self-pay | Admitting: Family Medicine

## 2011-02-06 NOTE — Telephone Encounter (Signed)
She is taking 100/25

## 2011-02-06 NOTE — Telephone Encounter (Signed)
Walk--in----------patient would like to know which dosage she should be taking? Losartin 100-25 or 100-12.5?

## 2011-02-09 ENCOUNTER — Ambulatory Visit (INDEPENDENT_AMBULATORY_CARE_PROVIDER_SITE_OTHER): Payer: Medicare PPO | Admitting: Family Medicine

## 2011-02-09 ENCOUNTER — Encounter: Payer: Self-pay | Admitting: Family Medicine

## 2011-02-09 VITALS — BP 128/78 | HR 63 | Temp 97.6°F | Resp 16 | Wt 191.0 lb

## 2011-02-09 DIAGNOSIS — I10 Essential (primary) hypertension: Secondary | ICD-10-CM

## 2011-02-09 DIAGNOSIS — G629 Polyneuropathy, unspecified: Secondary | ICD-10-CM

## 2011-02-09 DIAGNOSIS — R209 Unspecified disturbances of skin sensation: Secondary | ICD-10-CM

## 2011-02-09 DIAGNOSIS — G589 Mononeuropathy, unspecified: Secondary | ICD-10-CM

## 2011-02-09 LAB — VITAMIN B12: Vitamin B-12: 698 pg/mL (ref 211–911)

## 2011-02-09 MED ORDER — GABAPENTIN 100 MG PO CAPS: 100.0000 mg | ORAL_CAPSULE | Freq: Three times a day (TID) | ORAL | Status: DC

## 2011-02-09 MED ORDER — LOSARTAN POTASSIUM-HCTZ 100-12.5 MG PO TABS: 1.0000 | ORAL_TABLET | Freq: Every day | ORAL | Status: DC

## 2011-02-09 NOTE — Progress Notes (Signed)
  Subjective:    Patient ID: Bianca Matthews, female    DOB: 1928/08/13, 77 y.o.   MRN: [id number]  HPI Here to follow up on HTN and to discuss swelling in her feet. She has a long hx of varicose leg veins, but lately she has had some different symptoms in her feet including burning and tingling. No problems with the hands.    Review of Systems  Constitutional: Negative.   Respiratory: Negative.   Cardiovascular: Positive for leg swelling. Negative for chest pain and palpitations.  Neurological: Negative.        Objective:   Physical Exam  Constitutional: She appears well-developed and well-nourished.  Cardiovascular: Normal rate, regular rhythm, normal heart sounds and intact distal pulses.   Pulmonary/Chest: Effort normal and breath sounds normal.  Musculoskeletal: She exhibits edema. She exhibits no tenderness.          Assessment & Plan:  HTN is stable. She seems to have developed some neuropathy. Advised her to take a daily B complex vitamin. Check a B12 level. Try Neurontin.

## 2011-02-10 NOTE — Progress Notes (Signed)
Pt is aware and request a copy; address verified

## 2011-03-14 ENCOUNTER — Ambulatory Visit (INDEPENDENT_AMBULATORY_CARE_PROVIDER_SITE_OTHER): Payer: Medicare PPO | Admitting: Internal Medicine

## 2011-03-14 ENCOUNTER — Encounter: Payer: Self-pay | Admitting: Internal Medicine

## 2011-03-14 DIAGNOSIS — R21 Rash and other nonspecific skin eruption: Secondary | ICD-10-CM

## 2011-03-14 MED ORDER — TRIAMCINOLONE ACETONIDE 0.025 % EX OINT: TOPICAL_OINTMENT | Freq: Two times a day (BID) | CUTANEOUS | Status: AC

## 2011-03-14 NOTE — Progress Notes (Signed)
  Subjective:    Patient ID: Bianca Matthews, female    DOB: [birth year], 77 y.o.   MRN: [id number]  HPI Pt presents to clinic for evaluation of rash. Notes several day h/o bilateral forearm rash without significant itching. No associated pain or dermatomal distribution. Notes no recent changes in soaps, detergents or other possible skin contacts. Recently prescribed neurontin for LE neuropathy however did not take the medication due to fear of side effects. No spread of rash and no obvious exacerbating or alleviating factors.  No other complaints.  Reviewed pmh, medications and allergies.    Review of Systems  Constitutional: Negative for fever and chills.  Skin: Positive for color change and rash. Negative for wound.  Neurological: Positive for numbness.        Objective:   Physical Exam  Nursing note and vitals reviewed. Constitutional: She appears well-developed and well-nourished.  HENT:  Head: Normocephalic and atraumatic.  Eyes: Conjunctivae are normal. No scleral icterus.  Neurological: She is alert.  Skin: Skin is warm and dry. Rash noted. There is erythema. No pallor.       Faint erythematous rash bilateral dorsal arms  Psychiatric: She has a normal mood and affect.          Assessment & Plan:

## 2011-03-14 NOTE — Assessment & Plan Note (Signed)
Attempt triamcinolone to affected area bid. Followup if no improvement or worsening.

## 2011-03-23 ENCOUNTER — Encounter: Payer: Self-pay | Admitting: Family Medicine

## 2011-03-27 ENCOUNTER — Encounter: Payer: Self-pay | Admitting: Family Medicine

## 2011-03-27 ENCOUNTER — Ambulatory Visit (INDEPENDENT_AMBULATORY_CARE_PROVIDER_SITE_OTHER): Payer: Medicare PPO | Admitting: Family Medicine

## 2011-03-27 VITALS — BP 154/80 | HR 66 | Temp 97.8°F | Wt 190.0 lb

## 2011-03-27 DIAGNOSIS — G589 Mononeuropathy, unspecified: Secondary | ICD-10-CM

## 2011-03-27 DIAGNOSIS — I10 Essential (primary) hypertension: Secondary | ICD-10-CM

## 2011-03-27 DIAGNOSIS — G629 Polyneuropathy, unspecified: Secondary | ICD-10-CM

## 2011-03-27 MED ORDER — ROSUVASTATIN CALCIUM 10 MG PO TABS: 10.0000 mg | ORAL_TABLET | Freq: Every day | ORAL | Status: DC

## 2011-03-27 MED ORDER — LOSARTAN POTASSIUM-HCTZ 100-12.5 MG PO TABS: 1.0000 | ORAL_TABLET | Freq: Every day | ORAL | Status: DC

## 2011-03-27 MED ORDER — POTASSIUM CHLORIDE CRYS ER 20 MEQ PO TBCR: 20.0000 meq | EXTENDED_RELEASE_TABLET | Freq: Every day | ORAL | Status: DC

## 2011-03-27 MED ORDER — ATENOLOL 100 MG PO TABS: 100.0000 mg | ORAL_TABLET | Freq: Every day | ORAL | Status: DC

## 2011-03-27 MED ORDER — OMEPRAZOLE 40 MG PO CPDR: 40.0000 mg | DELAYED_RELEASE_CAPSULE | Freq: Every day | ORAL | Status: DC

## 2011-03-27 MED ORDER — FUROSEMIDE 40 MG PO TABS: 40.0000 mg | ORAL_TABLET | Freq: Two times a day (BID) | ORAL | Status: DC

## 2011-03-27 NOTE — Progress Notes (Signed)
  Subjective:    Patient ID: Bianca Matthews, female    DOB: Oct 01, 1928, 77 y.o.   MRN: [id number]  HPI Here for med refills. She is doing well as far as her BP goes. She is having some dental issues, and will be getting some work done later this week. She never started on Neurontin because she was concerned about possible side effects. The burning in her feet is tolerable so far. Taking a multivitamin.    Review of Systems  Constitutional: Negative.   Respiratory: Negative.   Cardiovascular: Negative.   Neurological: Negative.        Objective:   Physical Exam  Constitutional: She appears well-developed and well-nourished.  Cardiovascular: Normal rate, regular rhythm, normal heart sounds and intact distal pulses.   Pulmonary/Chest: Effort normal and breath sounds normal.          Assessment & Plan:  Refilled her meds. She will letme know if the neuropathy gets worse.

## 2011-04-19 ENCOUNTER — Encounter: Payer: Self-pay | Admitting: Family Medicine

## 2011-04-19 ENCOUNTER — Ambulatory Visit (INDEPENDENT_AMBULATORY_CARE_PROVIDER_SITE_OTHER): Payer: Medicare PPO | Admitting: Family Medicine

## 2011-04-19 ENCOUNTER — Ambulatory Visit (INDEPENDENT_AMBULATORY_CARE_PROVIDER_SITE_OTHER)
Admission: RE | Admit: 2011-04-19 | Discharge: 2011-04-19 | Disposition: A | Discharge status: Home / Self Care | Payer: Medicare PPO | Source: Ambulatory Visit | Attending: Family Medicine | Admitting: Family Medicine

## 2011-04-19 VITALS — BP 122/68 | HR 71 | Temp 98.2°F | Wt 188.0 lb

## 2011-04-19 DIAGNOSIS — M79609 Pain in unspecified limb: Secondary | ICD-10-CM

## 2011-04-19 DIAGNOSIS — M25569 Pain in unspecified knee: Secondary | ICD-10-CM

## 2011-04-19 DIAGNOSIS — S0003XA Contusion of scalp, initial encounter: Secondary | ICD-10-CM

## 2011-04-19 DIAGNOSIS — M25561 Pain in right knee: Secondary | ICD-10-CM

## 2011-04-19 DIAGNOSIS — S0083XA Contusion of other part of head, initial encounter: Secondary | ICD-10-CM

## 2011-04-19 DIAGNOSIS — S1093XA Contusion of unspecified part of neck, initial encounter: Secondary | ICD-10-CM

## 2011-04-19 DIAGNOSIS — S60229A Contusion of unspecified hand, initial encounter: Secondary | ICD-10-CM

## 2011-04-19 DIAGNOSIS — M79604 Pain in right leg: Secondary | ICD-10-CM

## 2011-04-19 NOTE — Progress Notes (Signed)
  Subjective:    Patient ID: Bianca Matthews, female    DOB: [birth year], 77 y.o.   MRN: [id number]  HPI Here to review injuries she sustained from a fall on 04-11-11. While walking into a shopping center that day, she tripped over a small step and fell onto her right side. No prior dizziness or chest pain or SOB, etc. During the fall she struck her right cheekbone, left hand, and right leg on the floor. No LOC. Her right cheek is sore but getting better. The left hand and thumb are sore but getting better. However the right knee and lower leg are still quite swollen and painful. She can walk but has some pain in the leg. No locking or giving way. Not taking any meds for pain.    Review of Systems  Constitutional: Negative.   Respiratory: Negative.   Cardiovascular: Positive for leg swelling. Negative for chest pain and palpitations.  Musculoskeletal: Positive for joint swelling and arthralgias.  Neurological: Negative for dizziness, syncope, weakness, light-headedness and headaches.       Objective:   Physical Exam  Constitutional: She appears well-developed and well-nourished.       Walks easily with no apparent difficulty  HENT:       The right zygoma is tender and a little ecchymotic. No swelling or crepitus. The eye is fine  Cardiovascular: Normal rate, regular rhythm, normal heart sounds and intact distal pulses.   Pulmonary/Chest: Effort normal and breath sounds normal.  Musculoskeletal:       The left thumb is slightly tender and swollen around the nail. Full ROM. The right knee and lower leg are very swollen and ecchymotic. She is tender around the right knee both medially and laterally. Full ROM. Negative anterior drawer and McMurrays. The swelling goes all the way down to the foot           Assessment & Plan:  The contusions to the face and hand should heal up very well. The right knee and leg certainly have some hematomas, but we need to rule out any fractures. Will send her  for Xrays today.

## 2011-04-20 ENCOUNTER — Telehealth: Payer: Self-pay | Admitting: Family Medicine

## 2011-04-20 NOTE — Telephone Encounter (Signed)
Requesting x ray results from yesterday.

## 2011-04-21 ENCOUNTER — Telehealth: Payer: Self-pay

## 2011-04-21 NOTE — Telephone Encounter (Signed)
Left voice message, results were normal.

## 2011-04-21 NOTE — Telephone Encounter (Signed)
Pt notified at office

## 2011-04-21 NOTE — Progress Notes (Signed)
Pt notified this morning

## 2011-04-21 NOTE — Telephone Encounter (Signed)
Message copied by Beverely Low on Fri Apr 21, 2011  3:19 PM ------      Message from: Gershon Crane A      Created: Thu Apr 20, 2011  1:22 PM       The knee looks okay

## 2011-11-13 ENCOUNTER — Ambulatory Visit (INDEPENDENT_AMBULATORY_CARE_PROVIDER_SITE_OTHER): Payer: Medicare PPO | Admitting: Family Medicine

## 2011-11-13 ENCOUNTER — Encounter: Payer: Self-pay | Admitting: Family Medicine

## 2011-11-13 VITALS — BP 112/78 | HR 59 | Temp 97.7°F | Wt 188.0 lb

## 2011-11-13 DIAGNOSIS — H109 Unspecified conjunctivitis: Secondary | ICD-10-CM

## 2011-11-13 DIAGNOSIS — J329 Chronic sinusitis, unspecified: Secondary | ICD-10-CM

## 2011-11-13 MED ORDER — NEOMYCIN-POLYMYXIN-HC 3.5-10000-1 OP SUSP: 2.0000 [drp] | Freq: Four times a day (QID) | OPHTHALMIC | Status: DC

## 2011-11-13 MED ORDER — METHYLPREDNISOLONE ACETATE 80 MG/ML IJ SUSP
120.0000 mg | Freq: Once | INTRAMUSCULAR | Status: AC
  Administered 2011-11-13: 120 mg via INTRAMUSCULAR

## 2011-11-13 MED ORDER — AZITHROMYCIN 250 MG PO TABS: ORAL_TABLET | ORAL | Status: AC

## 2011-11-13 NOTE — Progress Notes (Signed)
Addended by: Aniceto Boss A on: 11/13/2011 12:36 PM   Modules accepted: Orders

## 2011-11-13 NOTE — Progress Notes (Signed)
  Subjective:    Patient ID: Bianca Matthews, female    DOB: 13-Feb-1928, 78 y.o.   MRN: [id number]  HPI Here for one month of sinus pressure, HA, PND, ST, and itching eyes. She has a yellowish DC in the eyes at times. No cough or fever.    Review of Systems  Constitutional: Negative.   HENT: Positive for congestion, postnasal drip and sinus pressure.   Eyes: Positive for discharge, redness and itching. Negative for pain and visual disturbance.  Respiratory: Negative.        Objective:   Physical Exam  Constitutional: She appears well-developed and well-nourished.  HENT:  Right Ear: External ear normal.  Left Ear: External ear normal.  Nose: Nose normal.  Mouth/Throat: Oropharynx is clear and moist. No oropharyngeal exudate.  Eyes: Conjunctivae are normal.  Neck: No thyromegaly present.  Pulmonary/Chest: Effort normal and breath sounds normal.  Lymphadenopathy:    She has no cervical adenopathy.          Assessment & Plan:  Recheck prn

## 2011-12-11 ENCOUNTER — Telehealth: Payer: Self-pay | Admitting: Family Medicine

## 2011-12-11 NOTE — Telephone Encounter (Signed)
Pt came in to office and is req refill of neomycin-polymyxin-hydrocortisone (CORTISPORIN) 3.5-10000-1 ophthalmic suspension to [facility] on [address].  Pt would like to be notified when this has been called in to pharmacy.

## 2011-12-12 MED ORDER — NEOMYCIN-POLYMYXIN-HC 1 % OT SOLN: 3.0000 [drp] | Freq: Four times a day (QID) | OTIC | Status: DC

## 2011-12-12 NOTE — Telephone Encounter (Signed)
rx sent in electronically, pt aware 

## 2011-12-12 NOTE — Telephone Encounter (Signed)
Please call in a 10 ml bottle

## 2011-12-13 ENCOUNTER — Other Ambulatory Visit: Payer: Self-pay | Admitting: Family Medicine

## 2011-12-25 ENCOUNTER — Telehealth: Payer: Self-pay | Admitting: Family Medicine

## 2011-12-25 NOTE — Telephone Encounter (Signed)
I need to see these notes so I can tell what they are thinking about

## 2011-12-25 NOTE — Telephone Encounter (Signed)
Patient came in stating that her eye doctor is requesting her have a carotid doppler and she wants to know if she should have this done. Please advise.

## 2011-12-26 NOTE — Telephone Encounter (Signed)
Left voice message for pt. She can call the eye doctor's office and get them to fax over the last office note and why they are ordering the doppler.

## 2012-01-26 ENCOUNTER — Telehealth: Payer: Self-pay | Admitting: Family Medicine

## 2012-01-26 NOTE — Telephone Encounter (Signed)
Pt called and said that she went to an optometrist and had an eye exam back in March 2013, by Dr Reeves Dam at [facility]. They recommended that pt get a special test done. The optometrist was suppose to send a report to Dr Clent Ridges to review and pt wants to make sure that Dr Clent Ridges had rcvd it. Pt needs to know if Dr Clent Ridges feels that pt should get this test done.

## 2012-01-26 NOTE — Telephone Encounter (Signed)
I spoke with pt and she will get them to refax the report.

## 2012-01-26 NOTE — Telephone Encounter (Signed)
I have not seen such a report. I guess we need to ask them to send [place] a copy

## 2012-02-09 ENCOUNTER — Telehealth: Payer: Self-pay | Admitting: Family Medicine

## 2012-02-09 DIAGNOSIS — H349 Unspecified retinal vascular occlusion: Secondary | ICD-10-CM

## 2012-02-09 NOTE — Telephone Encounter (Signed)
Left voice message, the referral was put in.

## 2012-02-09 NOTE — Telephone Encounter (Signed)
She had an eye exam on 12-21-11 at [facility], and they recommended that she have a carotid doppler study done. 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 We just got this fax 2 days ago. We will go ahead and set this up soon.

## 2012-02-16 ENCOUNTER — Other Ambulatory Visit: Payer: Self-pay | Admitting: Cardiology

## 2012-02-16 DIAGNOSIS — H349 Unspecified retinal vascular occlusion: Secondary | ICD-10-CM

## 2012-02-19 ENCOUNTER — Encounter (INDEPENDENT_AMBULATORY_CARE_PROVIDER_SITE_OTHER): Payer: Medicare PPO

## 2012-02-19 DIAGNOSIS — H349 Unspecified retinal vascular occlusion: Secondary | ICD-10-CM

## 2012-02-19 DIAGNOSIS — H3582 Retinal ischemia: Secondary | ICD-10-CM

## 2012-02-19 DIAGNOSIS — I6529 Occlusion and stenosis of unspecified carotid artery: Secondary | ICD-10-CM

## 2012-02-24 ENCOUNTER — Other Ambulatory Visit: Payer: Self-pay | Admitting: Family Medicine

## 2012-02-27 ENCOUNTER — Telehealth: Payer: Self-pay | Admitting: Family Medicine

## 2012-02-27 NOTE — Telephone Encounter (Signed)
Pt had carotid doppler on 02-19-2012 at [facility]. Pt would like to know the next step?

## 2012-02-27 NOTE — Telephone Encounter (Signed)
I spoke with pt and went over results. 

## 2012-02-27 NOTE — Progress Notes (Signed)
Quick Note:  I spoke with pt ______ 

## 2012-03-04 ENCOUNTER — Telehealth: Payer: Self-pay | Admitting: Family Medicine

## 2012-03-04 NOTE — Telephone Encounter (Signed)
Patient called stating that she is having trouble with her mail order pharmacy and would like to have her atenolol called into [facility] at [address] or samples until she can get her rx. Please advise.

## 2012-03-05 ENCOUNTER — Other Ambulatory Visit: Payer: Self-pay | Admitting: Family Medicine

## 2012-03-05 NOTE — Telephone Encounter (Signed)
I called in script to local [id number] with 1 refill and left a voice message for pt.

## 2012-04-22 ENCOUNTER — Ambulatory Visit (INDEPENDENT_AMBULATORY_CARE_PROVIDER_SITE_OTHER): Payer: Medicare PPO | Admitting: Family Medicine

## 2012-04-22 ENCOUNTER — Encounter: Payer: Self-pay | Admitting: Family Medicine

## 2012-04-22 VITALS — BP 126/74 | HR 64 | Temp 97.8°F | Wt 191.0 lb

## 2012-04-22 DIAGNOSIS — I10 Essential (primary) hypertension: Secondary | ICD-10-CM

## 2012-04-22 DIAGNOSIS — R600 Localized edema: Secondary | ICD-10-CM

## 2012-04-22 DIAGNOSIS — R609 Edema, unspecified: Secondary | ICD-10-CM

## 2012-04-22 LAB — HEPATIC FUNCTION PANEL
ALT: 22 U/L (ref 0–35)
AST: 21 U/L (ref 0–37)
Albumin: 4.3 g/dL (ref 3.5–5.2)
Alkaline Phosphatase: 50 U/L (ref 39–117)
Bilirubin, Direct: 0 mg/dL (ref 0.0–0.3)
Total Bilirubin: 0.9 mg/dL (ref 0.3–1.2)
Total Protein: 7.5 g/dL (ref 6.0–8.3)

## 2012-04-22 LAB — POCT URINALYSIS DIPSTICK
Bilirubin, UA: NEGATIVE
Blood, UA: NEGATIVE
Glucose, UA: NEGATIVE
Ketones, UA: NEGATIVE
Leukocytes, UA: NEGATIVE
Nitrite, UA: NEGATIVE
Protein, UA: NEGATIVE
Spec Grav, UA: 1.01
Urobilinogen, UA: 0.2
pH, UA: 7

## 2012-04-22 LAB — CBC WITH DIFFERENTIAL/PLATELET
Basophils Absolute: 0 10*3/uL (ref 0.0–0.1)
Basophils Relative: 0.5 % (ref 0.0–3.0)
Eosinophils Absolute: 0.3 10*3/uL (ref 0.0–0.7)
Eosinophils Relative: 4.7 % (ref 0.0–5.0)
HCT: 41.2 % (ref 36.0–46.0)
Hemoglobin: 13.5 g/dL (ref 12.0–15.0)
Lymphocytes Relative: 33.8 % (ref 12.0–46.0)
Lymphs Abs: 1.9 10*3/uL (ref 0.7–4.0)
MCHC: 32.8 g/dL (ref 30.0–36.0)
MCV: 94.9 fl (ref 78.0–100.0)
Monocytes Absolute: 0.4 10*3/uL (ref 0.1–1.0)
Monocytes Relative: 7 % (ref 3.0–12.0)
Neutro Abs: 3 10*3/uL (ref 1.4–7.7)
Neutrophils Relative %: 54 % (ref 43.0–77.0)
Platelets: 226 10*3/uL (ref 150.0–400.0)
RBC: 4.34 Mil/uL (ref 3.87–5.11)
RDW: 14 % (ref 11.5–14.6)
WBC: 5.5 10*3/uL (ref 4.5–10.5)

## 2012-04-22 LAB — BASIC METABOLIC PANEL
BUN: 16 mg/dL (ref 6–23)
CO2: 32 mEq/L (ref 19–32)
Calcium: 9.5 mg/dL (ref 8.4–10.5)
Chloride: 95 mEq/L — ABNORMAL LOW (ref 96–112)
Creatinine, Ser: 0.8 mg/dL (ref 0.4–1.2)
GFR: 73.99 mL/min (ref >60.00)
Glucose, Bld: 112 mg/dL — ABNORMAL HIGH (ref 70–99)
Potassium: 3.7 mEq/L (ref 3.5–5.1)
Sodium: 137 mEq/L (ref 135–145)

## 2012-04-22 LAB — TSH: TSH: 2.93 u[IU]/mL (ref 0.35–5.50)

## 2012-04-22 MED ORDER — FUROSEMIDE 40 MG PO TABS: 40.0000 mg | ORAL_TABLET | Freq: Three times a day (TID) | ORAL | Status: DC

## 2012-04-22 MED ORDER — ATENOLOL 100 MG PO TABS: 100.0000 mg | ORAL_TABLET | Freq: Two times a day (BID) | ORAL | Status: DC

## 2012-04-22 NOTE — Progress Notes (Signed)
  Subjective:    Patient ID: Luanna Cole, female    DOB: [birth year], 78 y.o.   MRN: [id number]  HPI Here for several weeks for swelling in the hands and feet. No chest pain or SOB. She remains active.    Review of Systems  Constitutional: Negative.   Respiratory: Negative.   Cardiovascular: Positive for leg swelling. Negative for chest pain and palpitations.       Objective:   Physical Exam  Constitutional: She appears well-developed.  Neck: No thyromegaly present.  Cardiovascular: Normal rate, regular rhythm, normal heart sounds and intact distal pulses.  Exam reveals no gallop and no friction rub.   No murmur heard. Pulmonary/Chest: Effort normal and breath sounds normal.  Musculoskeletal:       2+ edema on both lower legs and feet   Lymphadenopathy:    She has no cervical adenopathy.          Assessment & Plan:  We will get labs today to check renal function, etc. Increase Lasix to 80 mg in the am and 40 mg in the pm.

## 2012-04-23 NOTE — Progress Notes (Signed)
Quick Note:  Left voice message with normal results. ______ 

## 2012-05-07 ENCOUNTER — Other Ambulatory Visit: Payer: Self-pay | Admitting: Family Medicine

## 2012-05-07 ENCOUNTER — Other Ambulatory Visit: Payer: Self-pay

## 2012-05-07 MED ORDER — ROSUVASTATIN CALCIUM 10 MG PO TABS: 10.0000 mg | ORAL_TABLET | Freq: Every day | ORAL | Status: DC

## 2012-05-09 ENCOUNTER — Telehealth: Payer: Self-pay | Admitting: Family Medicine

## 2012-05-09 MED ORDER — ATENOLOL 100 MG PO TABS: 100.0000 mg | ORAL_TABLET | Freq: Two times a day (BID) | ORAL | Status: DC

## 2012-05-09 MED ORDER — LOSARTAN POTASSIUM-HCTZ 100-12.5 MG PO TABS: 1.0000 | ORAL_TABLET | Freq: Every day | ORAL | Status: DC

## 2012-05-09 MED ORDER — ROSUVASTATIN CALCIUM 10 MG PO TABS: 10.0000 mg | ORAL_TABLET | Freq: Every day | ORAL | Status: DC

## 2012-05-09 NOTE — Telephone Encounter (Signed)
Pt requesting refills on atenolol (TENORMIN) 100 MG tablet, losartan-hydrochlorothiazide (HYZAAR) 100-12.5 MG per tablet and rosuvastatin (CRESTOR) 10 MG tablet   Pt stats that [facility] did not receive a request for the Crestor or Atenolol and it asking if they could be resubmit

## 2012-05-09 NOTE — Telephone Encounter (Signed)
Faxed

## 2012-07-03 ENCOUNTER — Other Ambulatory Visit: Payer: Self-pay | Admitting: Family Medicine

## 2012-10-29 ENCOUNTER — Encounter: Payer: Self-pay | Admitting: Family Medicine

## 2012-10-29 ENCOUNTER — Ambulatory Visit (INDEPENDENT_AMBULATORY_CARE_PROVIDER_SITE_OTHER): Payer: Medicare PPO | Admitting: Family Medicine

## 2012-10-29 VITALS — BP 130/70 | HR 57 | Temp 97.3°F | Wt 192.0 lb

## 2012-10-29 DIAGNOSIS — I10 Essential (primary) hypertension: Secondary | ICD-10-CM

## 2012-10-29 DIAGNOSIS — J309 Allergic rhinitis, unspecified: Secondary | ICD-10-CM

## 2012-10-29 NOTE — Progress Notes (Signed)
  Subjective:    Patient ID: Bianca Matthews, female    DOB: [birth year], 79 y.o.   MRN: [id number]  HPI Here for stuffy head, ST, and lots of PND. No cough or fever. Uses Allegra off and on but this is very sedating to her.    Review of Systems  Constitutional: Negative.   HENT: Positive for congestion and postnasal drip. Negative for ear pain, nosebleeds, rhinorrhea and sneezing.   Eyes: Negative.   Respiratory: Negative.        Objective:   Physical Exam  Constitutional: She appears well-developed and well-nourished.  HENT:  Right Ear: External ear normal.  Left Ear: External ear normal.  Nose: Nose normal.  Mouth/Throat: Oropharynx is clear and moist.  Eyes: Conjunctivae normal are normal.  Pulmonary/Chest: Effort normal and breath sounds normal.  Lymphadenopathy:    She has no cervical adenopathy.          Assessment & Plan:  This sounds like allergies again. Instead of antihistamines, try Mucinex for the drainage. She still gets shots at Dr. Lyla Son office as well.

## 2013-01-15 ENCOUNTER — Other Ambulatory Visit: Payer: Self-pay | Admitting: Family Medicine

## 2013-02-14 ENCOUNTER — Telehealth: Payer: Self-pay | Admitting: Family Medicine

## 2013-02-14 NOTE — Telephone Encounter (Signed)
Please make a Monday appt

## 2013-02-14 NOTE — Telephone Encounter (Signed)
Pt says she has a "personal hurt" and wants to come in Monday to see MD. No appt except "same day". Pls advise

## 2013-02-14 NOTE — Telephone Encounter (Signed)
Patient Information:  Caller Name: [place]  Phone: [phone]  Patient: Bianca Matthews  Gender: Female  DOB: Mar 23, 1928  Age: 79 Years  PCP: Gershon Crane [facility])  Office Follow Up:  Does the office need to follow up with this patient?: No  Instructions For The Office: N/A   Symptoms  Reason For Call & Symptoms: Had Appt at 11:00 am today 5/16,  fell when walked over uplift to edge of rug on floor, fell on abdomen.  Noting discomfort like scrape to Left arm and Left knee but no discomfort in those areas at all.  Now area under Right breast and toward back that feels some soreness or noting slight discomfort with cough only.  Has full range of motion.  Says sore not pain.  hurts to cough  Reviewed Health History In EMR: Yes  Reviewed Medications In EMR: Yes  Reviewed Allergies In EMR: Yes  Reviewed Surgeries / Procedures: Yes  Date of Onset of Symptoms: 02/14/2013  Guideline(s) Used:  Chest Injury  Disposition Per Guideline:   Home Care  Reason For Disposition Reached:   Chest wall swelling, bruise, or pain from direct blow to chest  Advice Given:  Apply a Cold Pack:  Apply a cold pack or an ice bag (wrapped in a moist towel) to the area for 20 minutes. Repeat in 1 hour, then every 4 hours while awake.  Continue this for the first 48 hours after an injury (Reason: to reduce the swelling and pain).  Apply Heat to the Area:  Beginning 48 hours after an injury, apply a warm washcloth or heating pad for 10 minutes three times a day.  This will help increase blood flow and improve healing.  Breathing Exercises:  Take 2 deep breaths and then cough once each hour while awake. Use a pillow over the injured area to reduce the pain of this activity.  This help you get good air movement to all parts of your lungs and help prevent lung infection.  Limit Activities:  Avoid strenuous activities or contact sports until the pain is gone.  Call Back If:  Swelling or bruise  becomes over 2 inches (5 cm).  Pain not improved after 72 hours  Pain or swelling lasts over 7 days  You become worse.  Patient Will Follow Care Advice:  YES

## 2013-02-14 NOTE — Telephone Encounter (Signed)
appt set/kh 

## 2013-02-14 NOTE — Telephone Encounter (Signed)
See below note.

## 2013-02-17 ENCOUNTER — Ambulatory Visit (INDEPENDENT_AMBULATORY_CARE_PROVIDER_SITE_OTHER): Payer: Medicare PPO | Admitting: Family Medicine

## 2013-02-17 ENCOUNTER — Encounter: Payer: Self-pay | Admitting: Family Medicine

## 2013-02-17 VITALS — BP 126/78 | HR 60 | Temp 97.7°F | Wt 190.0 lb

## 2013-02-17 DIAGNOSIS — S20211A Contusion of right front wall of thorax, initial encounter: Secondary | ICD-10-CM

## 2013-02-17 DIAGNOSIS — S8002XA Contusion of left knee, initial encounter: Secondary | ICD-10-CM

## 2013-02-17 DIAGNOSIS — S20219A Contusion of unspecified front wall of thorax, initial encounter: Secondary | ICD-10-CM

## 2013-02-17 DIAGNOSIS — S8000XA Contusion of unspecified knee, initial encounter: Secondary | ICD-10-CM

## 2013-02-17 MED ORDER — HYDROCODONE-ACETAMINOPHEN 5-325 MG PO TABS: 1.0000 | ORAL_TABLET | Freq: Four times a day (QID) | ORAL | Status: DC | PRN

## 2013-02-17 NOTE — Progress Notes (Signed)
  Subjective:    Patient ID: Bianca Matthews, female    DOB: 1928-06-02, 79 y.o.   MRN: [id number]  HPI Here for follow up of injuries form a fall on 02-14-13. She tripped over a wrinkle in some carpet, and fell forward bruising her right ribs and her left knee. The knee is getting better but is still sore. Her ribs are painful but she has no SOB.    Review of Systems  Constitutional: Negative.   Respiratory: Negative.   Cardiovascular: Positive for chest pain. Negative for palpitations and leg swelling.       Objective:   Physical Exam  Constitutional: She appears well-developed and well-nourished. No distress.  Cardiovascular: Normal rate, regular rhythm, normal heart sounds and intact distal pulses.   Pulmonary/Chest: Effort normal and breath sounds normal. No respiratory distress. She has no wheezes. She has no rales.  Mildly tender over the right lower lateral ribs. No crepitus   Musculoskeletal:  The left knee is tender over the patella but no swelling and full ROM           Assessment & Plan:  Rest, heat. Vicodin for pain, recheck prn

## 2013-03-10 ENCOUNTER — Encounter: Payer: Self-pay | Admitting: Family Medicine

## 2013-03-10 ENCOUNTER — Ambulatory Visit (INDEPENDENT_AMBULATORY_CARE_PROVIDER_SITE_OTHER): Payer: Medicare PPO | Admitting: Family Medicine

## 2013-03-10 VITALS — BP 130/74 | HR 63 | Wt 190.0 lb

## 2013-03-10 DIAGNOSIS — S20211D Contusion of right front wall of thorax, subsequent encounter: Secondary | ICD-10-CM

## 2013-03-10 DIAGNOSIS — Z5189 Encounter for other specified aftercare: Secondary | ICD-10-CM

## 2013-03-10 NOTE — Progress Notes (Signed)
  Subjective:    Patient ID: Bianca Matthews, female    DOB: September 04, 1928, 79 y.o.   MRN: [id number]  HPI Here to follow up on injuries from a fall on 02-14-13. She had bruised her right ribs and her left knee. These areas are slowly healing and she feels a lot better. She still has some soreness in the right ribs but she is walking normally. She has actually started light work outs at her gym again. She asks if she should continue to try acupuncture for the neuropathy in her feet. She has had only one treatment so far.   Review of Systems  Constitutional: Negative.   Respiratory: Negative.   Cardiovascular: Positive for chest pain. Negative for palpitations and leg swelling.       Objective:   Physical Exam  Constitutional: She appears well-developed and well-nourished. No distress.  Cardiovascular: Normal rate, regular rhythm, normal heart sounds and intact distal pulses.   Pulmonary/Chest: Effort normal and breath sounds normal. No respiratory distress. She has no wheezes. She has no rales.  Slightly tender along the right ribs  Musculoskeletal: Normal range of motion. She exhibits no edema and no tenderness.          Assessment & Plan:  She is healing as expected. I advised her to stick with the acupuncture for awhile to see what happens.

## 2013-04-03 ENCOUNTER — Other Ambulatory Visit: Payer: Self-pay | Admitting: Family Medicine

## 2013-06-18 ENCOUNTER — Other Ambulatory Visit: Payer: Self-pay | Admitting: Family Medicine

## 2013-06-18 NOTE — Telephone Encounter (Signed)
Can we refill these? Also a 90 day supply.

## 2013-06-18 NOTE — Telephone Encounter (Signed)
Refill all these for one year  

## 2013-07-21 ENCOUNTER — Telehealth: Payer: Self-pay | Admitting: Family Medicine

## 2013-07-21 ENCOUNTER — Other Ambulatory Visit: Payer: Self-pay | Admitting: Family Medicine

## 2013-07-21 NOTE — Telephone Encounter (Signed)
Refill request for Crestor 10 mg and send to [facility].

## 2013-07-22 NOTE — Telephone Encounter (Signed)
Can we refill this for a 90 day supply?

## 2013-07-22 NOTE — Telephone Encounter (Signed)
Yes and refill for one year

## 2013-07-23 MED ORDER — ROSUVASTATIN CALCIUM 10 MG PO TABS: 10.0000 mg | ORAL_TABLET | Freq: Every day | ORAL | Status: DC

## 2013-07-23 NOTE — Telephone Encounter (Signed)
I sent script e-scribe. 

## 2013-08-04 ENCOUNTER — Ambulatory Visit: Payer: Medicare PPO | Admitting: Family Medicine

## 2013-08-04 ENCOUNTER — Encounter: Payer: Self-pay | Admitting: Family Medicine

## 2013-08-04 ENCOUNTER — Ambulatory Visit (INDEPENDENT_AMBULATORY_CARE_PROVIDER_SITE_OTHER): Payer: Medicare PPO | Admitting: Family Medicine

## 2013-08-04 VITALS — BP 136/62 | HR 70 | Temp 97.8°F | Wt 187.0 lb

## 2013-08-04 DIAGNOSIS — M129 Arthropathy, unspecified: Secondary | ICD-10-CM

## 2013-08-04 DIAGNOSIS — R7309 Other abnormal glucose: Secondary | ICD-10-CM

## 2013-08-04 DIAGNOSIS — R739 Hyperglycemia, unspecified: Secondary | ICD-10-CM

## 2013-08-04 DIAGNOSIS — IMO0001 Reserved for inherently not codable concepts without codable children: Secondary | ICD-10-CM

## 2013-08-04 DIAGNOSIS — I10 Essential (primary) hypertension: Secondary | ICD-10-CM

## 2013-08-04 DIAGNOSIS — R609 Edema, unspecified: Secondary | ICD-10-CM

## 2013-08-04 LAB — CBC WITH DIFFERENTIAL/PLATELET
Basophils Absolute: 0 10*3/uL (ref 0.0–0.1)
Basophils Relative: 0.4 % (ref 0.0–3.0)
Eosinophils Absolute: 0.3 10*3/uL (ref 0.0–0.7)
Eosinophils Relative: 3.6 % (ref 0.0–5.0)
HCT: 41.2 % (ref 36.0–46.0)
Hemoglobin: 13.6 g/dL (ref 12.0–15.0)
Lymphocytes Relative: 27.8 % (ref 12.0–46.0)
Lymphs Abs: 2.2 10*3/uL (ref 0.7–4.0)
MCHC: 33.1 g/dL (ref 30.0–36.0)
MCV: 91.4 fl (ref 78.0–100.0)
Monocytes Absolute: 0.6 10*3/uL (ref 0.1–1.0)
Monocytes Relative: 7.4 % (ref 3.0–12.0)
Neutro Abs: 4.7 10*3/uL (ref 1.4–7.7)
Neutrophils Relative %: 60.8 % (ref 43.0–77.0)
Platelets: 243 10*3/uL (ref 150.0–400.0)
RBC: 4.51 Mil/uL (ref 3.87–5.11)
RDW: 14.9 % — ABNORMAL HIGH (ref 11.5–14.6)
WBC: 7.7 10*3/uL (ref 4.5–10.5)

## 2013-08-04 LAB — BASIC METABOLIC PANEL
BUN: 28 mg/dL — ABNORMAL HIGH (ref 6–23)
CO2: 34 mEq/L — ABNORMAL HIGH (ref 19–32)
Calcium: 9.6 mg/dL (ref 8.4–10.5)
Chloride: 98 mEq/L (ref 96–112)
Creatinine, Ser: 1.1 mg/dL (ref 0.4–1.2)
GFR: 53.12 mL/min — ABNORMAL LOW (ref >60.00)
Glucose, Bld: 104 mg/dL — ABNORMAL HIGH (ref 70–99)
Potassium: 3.8 mEq/L (ref 3.5–5.1)
Sodium: 139 mEq/L (ref 135–145)

## 2013-08-04 LAB — HEPATIC FUNCTION PANEL
ALT: 22 U/L (ref 0–35)
AST: 22 U/L (ref 0–37)
Albumin: 4 g/dL (ref 3.5–5.2)
Alkaline Phosphatase: 46 U/L (ref 39–117)
Bilirubin, Direct: 0 mg/dL (ref 0.0–0.3)
Total Bilirubin: 0.7 mg/dL (ref 0.3–1.2)
Total Protein: 7.5 g/dL (ref 6.0–8.3)

## 2013-08-04 LAB — HEMOGLOBIN A1C: Hgb A1c MFr Bld: 6.3 % (ref 4.6–6.5)

## 2013-08-04 LAB — CK: Total CK: 53 U/L (ref 7–177)

## 2013-08-04 LAB — TSH: TSH: 1.54 u[IU]/mL (ref 0.35–5.50)

## 2013-08-04 NOTE — Progress Notes (Signed)
  Subjective:    Patient ID: Bianca Matthews, female    DOB: 02-04-1928, 79 y.o.   MRN: [id number]  HPI Here for one month of dry mouth and burning in the legs and feet. Her BP has been stable.    Review of Systems  Respiratory: Negative.   Cardiovascular: Negative.   Neurological: Negative.        Objective:   Physical Exam  Constitutional: She is oriented to person, place, and time. She appears well-developed and well-nourished.  Neck: No thyromegaly present.  Cardiovascular: Normal rate, regular rhythm, normal heart sounds and intact distal pulses.   Pulmonary/Chest: Effort normal and breath sounds normal.  Lymphadenopathy:    She has no cervical adenopathy.  Neurological: She is alert and oriented to person, place, and time.          Assessment & Plan:  She may be having side effects of meds. Get labs today

## 2013-08-07 ENCOUNTER — Telehealth: Payer: Self-pay | Admitting: Family Medicine

## 2013-08-07 NOTE — Telephone Encounter (Signed)
Pt would like a copy of latest labs mailed to her home, and  Pt would like to know should she be taking Crestor 10mg  1 @ bedtime

## 2013-08-07 NOTE — Progress Notes (Signed)
Quick Note:  I left voice message with results. ______ 

## 2013-08-08 NOTE — Telephone Encounter (Signed)
I left a voice message for pt. I put a copy of results in mail today and also advised pt to check with pharmacist to ask when is the best time to take this medication.

## 2013-08-26 ENCOUNTER — Telehealth: Payer: Self-pay | Admitting: Family Medicine

## 2013-08-26 NOTE — Telephone Encounter (Signed)
Pt requesting samples of Crestor 10mg .  Please call pt and let her know if we have any samples available.

## 2013-08-26 NOTE — Telephone Encounter (Signed)
We do not have any samples and I spoke with pt.

## 2013-10-08 ENCOUNTER — Other Ambulatory Visit: Payer: Self-pay | Admitting: Family Medicine

## 2013-12-08 ENCOUNTER — Ambulatory Visit (INDEPENDENT_AMBULATORY_CARE_PROVIDER_SITE_OTHER): Payer: Medicare PPO | Admitting: Family Medicine

## 2013-12-08 ENCOUNTER — Encounter: Payer: Self-pay | Admitting: Family Medicine

## 2013-12-08 VITALS — BP 150/60 | HR 66 | Temp 97.9°F | Ht 64.0 in | Wt 184.0 lb

## 2013-12-08 DIAGNOSIS — M545 Low back pain, unspecified: Secondary | ICD-10-CM

## 2013-12-08 DIAGNOSIS — I10 Essential (primary) hypertension: Secondary | ICD-10-CM

## 2013-12-08 NOTE — Progress Notes (Signed)
   Subjective:    Patient ID: Bianca Matthews, female    DOB: November 02, 1927, 80 y.o.   MRN: [id number]  HPI Here to check her BP and to discuss low back pain. She is seeing a chiropractor regularly and using heat. This helps a little.    Review of Systems  Constitutional: Negative.   Respiratory: Negative.   Cardiovascular: Negative.   Musculoskeletal: Positive for back pain.       Objective:   Physical Exam  Constitutional: She appears well-developed and well-nourished.  Cardiovascular: Normal rate, regular rhythm, normal heart sounds and intact distal pulses.   Pulmonary/Chest: Effort normal and breath sounds normal.  Musculoskeletal:  Tender over the right SI joint           Assessment & Plan:  HTN is stable. For the back pain try Aleve bid

## 2013-12-08 NOTE — Progress Notes (Signed)
Pre visit review using our clinic review tool, if applicable. No additional management support is needed unless otherwise documented below in the visit note. 

## 2013-12-09 ENCOUNTER — Telehealth: Payer: Self-pay | Admitting: Family Medicine

## 2013-12-09 NOTE — Telephone Encounter (Signed)
Relevant patient education mailed to patient.  

## 2013-12-10 ENCOUNTER — Other Ambulatory Visit: Payer: Self-pay | Admitting: Family Medicine

## 2014-01-30 ENCOUNTER — Ambulatory Visit (INDEPENDENT_AMBULATORY_CARE_PROVIDER_SITE_OTHER): Payer: Commercial Managed Care - HMO | Admitting: Family Medicine

## 2014-01-30 ENCOUNTER — Encounter: Payer: Self-pay | Admitting: Family Medicine

## 2014-01-30 VITALS — BP 165/92 | HR 58 | Ht 64.0 in | Wt 184.0 lb

## 2014-01-30 DIAGNOSIS — F411 Generalized anxiety disorder: Secondary | ICD-10-CM

## 2014-01-30 DIAGNOSIS — I10 Essential (primary) hypertension: Secondary | ICD-10-CM

## 2014-01-30 MED ORDER — LORAZEPAM 0.5 MG PO TABS: 0.5000 mg | ORAL_TABLET | Freq: Three times a day (TID) | ORAL | Status: DC | PRN

## 2014-01-30 NOTE — Progress Notes (Signed)
   Subjective:    Patient ID: Bianca Matthews, female    DOB: [birth year], 80 y.o.   MRN: [id number]  HPI Here to talk about elevated BPs which she attributes to stress. She feels anxious at times and can't relax. She does sleep well.    Review of Systems  Constitutional: Negative.   Respiratory: Negative.   Cardiovascular: Negative.   Psychiatric/Behavioral: Negative for confusion, dysphoric mood, decreased concentration and agitation. The patient is nervous/anxious.        Objective:   Physical Exam  Constitutional: She appears well-developed and well-nourished.  Cardiovascular: Normal rate, regular rhythm, normal heart sounds and intact distal pulses.   Pulmonary/Chest: Effort normal and breath sounds normal.  Psychiatric: She has a normal mood and affect. Her behavior is normal. Thought content normal.          Assessment & Plan:  We will try to get the stress under control, and I think her BP will settle down as well. Try Lorazepam prn

## 2014-06-16 ENCOUNTER — Ambulatory Visit (INDEPENDENT_AMBULATORY_CARE_PROVIDER_SITE_OTHER): Payer: Medicare PPO | Admitting: Family Medicine

## 2014-06-16 ENCOUNTER — Encounter: Payer: Self-pay | Admitting: Family Medicine

## 2014-06-16 VITALS — BP 168/83 | HR 60 | Temp 97.9°F | Ht 64.0 in | Wt 182.0 lb

## 2014-06-16 DIAGNOSIS — E785 Hyperlipidemia, unspecified: Secondary | ICD-10-CM | POA: Insufficient documentation

## 2014-06-16 DIAGNOSIS — M545 Low back pain, unspecified: Secondary | ICD-10-CM

## 2014-06-16 DIAGNOSIS — G8929 Other chronic pain: Secondary | ICD-10-CM | POA: Insufficient documentation

## 2014-06-16 DIAGNOSIS — Z23 Encounter for immunization: Secondary | ICD-10-CM

## 2014-06-16 DIAGNOSIS — K219 Gastro-esophageal reflux disease without esophagitis: Secondary | ICD-10-CM

## 2014-06-16 DIAGNOSIS — F411 Generalized anxiety disorder: Secondary | ICD-10-CM

## 2014-06-16 DIAGNOSIS — I10 Essential (primary) hypertension: Secondary | ICD-10-CM

## 2014-06-16 MED ORDER — OMEPRAZOLE 40 MG PO CPDR: DELAYED_RELEASE_CAPSULE | ORAL | Status: DC

## 2014-06-16 MED ORDER — POTASSIUM CHLORIDE CRYS ER 20 MEQ PO TBCR: EXTENDED_RELEASE_TABLET | ORAL | Status: DC

## 2014-06-16 MED ORDER — ATENOLOL 100 MG PO TABS: ORAL_TABLET | ORAL | Status: DC

## 2014-06-16 MED ORDER — LOSARTAN POTASSIUM-HCTZ 100-12.5 MG PO TABS: ORAL_TABLET | ORAL | Status: DC

## 2014-06-16 MED ORDER — ROSUVASTATIN CALCIUM 10 MG PO TABS: 10.0000 mg | ORAL_TABLET | Freq: Every day | ORAL | Status: DC

## 2014-06-16 NOTE — Progress Notes (Signed)
Pre visit review using our clinic review tool, if applicable. No additional management support is needed unless otherwise documented below in the visit note. 

## 2014-06-16 NOTE — Progress Notes (Signed)
   Subjective:    Patient ID: Bianca Matthews, female    DOB: 04/26/1928, 80 y.o.   MRN: [id number]  HPI Here to follow up. She feels well except for chronic low back pain. She sees Dr. Zack Seal twice a week for chiropractic adjustments and this seems to slowly be causing improvement. Her BP is stable.     Review of Systems  Constitutional: Negative.   Respiratory: Negative.   Cardiovascular: Negative.        Objective:   Physical Exam  Constitutional: She is oriented to person, place, and time. She appears well-developed and well-nourished.  Cardiovascular: Normal rate, regular rhythm, normal heart sounds and intact distal pulses.   Pulmonary/Chest: Effort normal and breath sounds normal.  Musculoskeletal: She exhibits no edema.  Neurological: She is alert and oriented to person, place, and time.          Assessment & Plan:  Refilled her meds.

## 2014-08-03 ENCOUNTER — Encounter: Payer: Self-pay | Admitting: Family Medicine

## 2014-08-18 ENCOUNTER — Other Ambulatory Visit: Payer: Self-pay | Admitting: Family Medicine

## 2014-08-26 ENCOUNTER — Other Ambulatory Visit: Payer: Self-pay | Admitting: Family Medicine

## 2014-08-26 ENCOUNTER — Telehealth: Payer: Self-pay | Admitting: Family Medicine

## 2014-08-26 NOTE — Telephone Encounter (Signed)
Patient would like to know why her Crestor re-fill was denied on 08/18/14.  She would like a callback.

## 2014-08-26 NOTE — Telephone Encounter (Signed)
I spoke with pt and script was sent back in September 2015. Pt stated that she has since spoken with the mail order and they are processing it now.

## 2014-08-31 ENCOUNTER — Telehealth: Payer: Self-pay | Admitting: Family Medicine

## 2014-08-31 NOTE — Telephone Encounter (Signed)
I thought this was already taken care of. Please see if you can figure this

## 2014-08-31 NOTE — Telephone Encounter (Signed)
Pt got a letter stating that her Crestor Rx was denied. Has been off of it for a week waiting on Rx to be filled. Wants to know if she needs to continue taking it.

## 2014-09-01 NOTE — Telephone Encounter (Signed)
Bianca Matthews called the pharmacy and spoke to them based on 08/26/14 note.  I left a message on the patient's machine to call me back since I don't know what letter she received.   I call to submit a PA just in case that is what was needed and was advised the medication doesn't require and PA and is okay to fill.  There shouldn't be any hold ups on the medication if it was sent in.

## 2014-09-01 NOTE — Telephone Encounter (Signed)
Per Dr. Sarajane Jews pt should continue with this medication and I did call and leave a voice message with this information.

## 2014-09-01 NOTE — Telephone Encounter (Signed)
Patient states her medication arrived yesterday.  She was just wanting to make sure it was okay to start back taking after being off for 10 days.  She has started back on the medication.

## 2015-02-16 ENCOUNTER — Telehealth: Payer: Self-pay | Admitting: Family Medicine

## 2015-02-16 NOTE — Telephone Encounter (Signed)
Pt request refill furosemide (LASIX) 40 MG tablet 90 day  [facility]  Pt made appt for wed, may 25 at 8:30 .

## 2015-02-18 MED ORDER — FUROSEMIDE 40 MG PO TABS: ORAL_TABLET | ORAL | Status: DC

## 2015-02-18 NOTE — Telephone Encounter (Signed)
I sent script e-scribe. 

## 2015-02-24 ENCOUNTER — Ambulatory Visit (INDEPENDENT_AMBULATORY_CARE_PROVIDER_SITE_OTHER): Payer: Medicare PPO | Admitting: Family Medicine

## 2015-02-24 ENCOUNTER — Encounter: Payer: Self-pay | Admitting: Family Medicine

## 2015-02-24 ENCOUNTER — Other Ambulatory Visit: Payer: Self-pay | Admitting: Family Medicine

## 2015-02-24 VITALS — BP 147/80 | HR 62 | Temp 97.9°F | Ht 64.0 in | Wt 188.0 lb

## 2015-02-24 DIAGNOSIS — R609 Edema, unspecified: Secondary | ICD-10-CM | POA: Diagnosis not present

## 2015-02-24 DIAGNOSIS — R739 Hyperglycemia, unspecified: Secondary | ICD-10-CM

## 2015-02-24 DIAGNOSIS — F411 Generalized anxiety disorder: Secondary | ICD-10-CM

## 2015-02-24 DIAGNOSIS — I10 Essential (primary) hypertension: Secondary | ICD-10-CM

## 2015-02-24 LAB — BASIC METABOLIC PANEL
BUN: 20 mg/dL (ref 6–23)
CO2: 35 mEq/L — ABNORMAL HIGH (ref 19–32)
Calcium: 9.7 mg/dL (ref 8.4–10.5)
Chloride: 96 mEq/L (ref 96–112)
Creatinine, Ser: 0.82 mg/dL (ref 0.40–1.20)
GFR: 70.39 mL/min (ref >60.00)
Glucose, Bld: 111 mg/dL — ABNORMAL HIGH (ref 70–99)
Potassium: 3.6 mEq/L (ref 3.5–5.1)
Sodium: 138 mEq/L (ref 135–145)

## 2015-02-24 LAB — HEPATIC FUNCTION PANEL
ALT: 18 U/L (ref 0–35)
AST: 17 U/L (ref 0–37)
Albumin: 4.4 g/dL (ref 3.5–5.2)
Alkaline Phosphatase: 50 U/L (ref 39–117)
Bilirubin, Direct: 0.1 mg/dL (ref 0.0–0.3)
Total Bilirubin: 0.7 mg/dL (ref 0.2–1.2)
Total Protein: 7.7 g/dL (ref 6.0–8.3)

## 2015-02-24 LAB — CBC WITH DIFFERENTIAL/PLATELET
Basophils Absolute: 0 10*3/uL (ref 0.0–0.1)
Basophils Relative: 0.4 % (ref 0.0–3.0)
Eosinophils Absolute: 0.3 10*3/uL (ref 0.0–0.7)
Eosinophils Relative: 3.8 % (ref 0.0–5.0)
HCT: 42.8 % (ref 36.0–46.0)
Hemoglobin: 14.6 g/dL (ref 12.0–15.0)
Lymphocytes Relative: 25.7 % (ref 12.0–46.0)
Lymphs Abs: 1.7 10*3/uL (ref 0.7–4.0)
MCHC: 34 g/dL (ref 30.0–36.0)
MCV: 89.9 fl (ref 78.0–100.0)
Monocytes Absolute: 0.5 10*3/uL (ref 0.1–1.0)
Monocytes Relative: 6.8 % (ref 3.0–12.0)
Neutro Abs: 4.3 10*3/uL (ref 1.4–7.7)
Neutrophils Relative %: 63.3 % (ref 43.0–77.0)
Platelets: 258 10*3/uL (ref 150.0–400.0)
RBC: 4.76 Mil/uL (ref 3.87–5.11)
RDW: 14.6 % (ref 11.5–15.5)
WBC: 6.8 10*3/uL (ref 4.0–10.5)

## 2015-02-24 LAB — LIPID PANEL
Cholesterol: 175 mg/dL (ref 0–200)
HDL: 61 mg/dL (ref >39.00)
LDL Cholesterol: 86 mg/dL (ref 0–99)
NonHDL: 114
Total CHOL/HDL Ratio: 3
Triglycerides: 138 mg/dL (ref 0.0–149.0)
VLDL: 27.6 mg/dL (ref 0.0–40.0)

## 2015-02-24 LAB — POCT URINALYSIS DIPSTICK
Bilirubin, UA: NEGATIVE
Blood, UA: NEGATIVE
Glucose, UA: NEGATIVE
Ketones, UA: NEGATIVE
Nitrite, UA: NEGATIVE
Protein, UA: NEGATIVE
Spec Grav, UA: 1.015
Urobilinogen, UA: 0.2
pH, UA: 7.5

## 2015-02-24 LAB — TSH: TSH: 2.87 u[IU]/mL (ref 0.35–4.50)

## 2015-02-24 LAB — HEMOGLOBIN A1C: Hgb A1c MFr Bld: 6 % (ref 4.6–6.5)

## 2015-02-24 MED ORDER — FUROSEMIDE 40 MG PO TABS: ORAL_TABLET | ORAL | Status: DC

## 2015-02-24 NOTE — Progress Notes (Signed)
   Subjective:    Patient ID: Bianca Matthews, female    DOB: 17-Sep-1928, 81 y.o.   MRN: [id number]  HPI Here to follow up. She feels well. Her foot swelling is controlled and her BP is stable.    Review of Systems  Constitutional: Negative.   Respiratory: Negative.   Cardiovascular: Negative.   Endocrine: Negative.   Psychiatric/Behavioral: Negative.        Objective:   Physical Exam  Constitutional: She appears well-developed and well-nourished.  Neck: No thyromegaly present.  Cardiovascular: Normal rate, regular rhythm, normal heart sounds and intact distal pulses.   Pulmonary/Chest: Effort normal.  Musculoskeletal:  Trace edema in the feet   Lymphadenopathy:    She has no cervical adenopathy.          Assessment & Plan:  She is doing well. We will get fasting labs today.

## 2015-02-24 NOTE — Progress Notes (Signed)
Pre visit review using our clinic review tool, if applicable. No additional management support is needed unless otherwise documented below in the visit note. 

## 2015-03-02 ENCOUNTER — Telehealth: Payer: Self-pay | Admitting: Family Medicine

## 2015-03-02 NOTE — Telephone Encounter (Signed)
Pt needs refill on crestor call into [facility] and also blood work results

## 2015-03-02 NOTE — Telephone Encounter (Signed)
I spoke with pt, put a copy of lab results in mail, per pt request and called in a 7 day supply of Crestor to [place], pt is waiting on her mail order for this script.

## 2015-05-14 ENCOUNTER — Other Ambulatory Visit: Payer: Self-pay | Admitting: Family Medicine

## 2015-05-28 ENCOUNTER — Encounter: Payer: Self-pay | Admitting: Podiatry

## 2015-05-28 ENCOUNTER — Ambulatory Visit (INDEPENDENT_AMBULATORY_CARE_PROVIDER_SITE_OTHER): Payer: Medicare PPO | Admitting: Podiatry

## 2015-05-28 DIAGNOSIS — B351 Tinea unguium: Secondary | ICD-10-CM

## 2015-05-28 DIAGNOSIS — M79676 Pain in unspecified toe(s): Secondary | ICD-10-CM

## 2015-05-28 NOTE — Progress Notes (Signed)
Patient ID: Bianca Matthews, female   DOB: 1928-08-18, 81 y.o.   MRN: [id number] Complaint:  Visit Type: Patient returns to my office for continued preventative foot care services. Complaint: Patient states" my nails have grown long and thick and become painful to walk and wear shoes" . The patient presents for preventative foot care services. No changes to ROS  Podiatric Exam: Vascular: dorsalis pedis and posterior tibial pulses are palpable bilateral. Capillary return is immediate. Temperature gradient is WNL. Skin turgor WNL  Sensorium: Normal Semmes Weinstein monofilament test. Normal tactile sensation bilaterally. Nail Exam: Pt has thick disfigured discolored nails with subungual debris noted bilateral entire nail hallux  Ulcer Exam: There is no evidence of ulcer or pre-ulcerative changes or infection. Orthopedic Exam: Muscle tone and strength are WNL. No limitations in general ROM. No crepitus or effusions noted. Foot type and digits show no abnormalities. Bony prominences are unremarkable. Skin: No Porokeratosis. No infection or ulcers  Diagnosis:  Onychomycosis, , Pain in right toe, pain in left toes  Treatment & Plan Procedures and Treatment: Consent by patient was obtained for treatment procedures. The patient understood the discussion of treatment and procedures well. All questions were answered thoroughly reviewed. Debridement of mycotic and hypertrophic toenails, 1 through 5 bilateral and clearing of subungual debris. No ulceration, no infection noted.  Return Visit-Office Procedure: Patient instructed to return to the office for a follow up visit 3 months for continued evaluation and treatment.

## 2015-08-05 ENCOUNTER — Other Ambulatory Visit: Payer: Self-pay | Admitting: Family Medicine

## 2015-09-08 ENCOUNTER — Ambulatory Visit (INDEPENDENT_AMBULATORY_CARE_PROVIDER_SITE_OTHER): Payer: Medicare PPO | Admitting: Podiatry

## 2015-09-08 ENCOUNTER — Encounter: Payer: Self-pay | Admitting: Podiatry

## 2015-09-08 DIAGNOSIS — M79676 Pain in unspecified toe(s): Secondary | ICD-10-CM

## 2015-09-08 DIAGNOSIS — B351 Tinea unguium: Secondary | ICD-10-CM | POA: Diagnosis not present

## 2015-09-08 NOTE — Progress Notes (Signed)
Patient ID: Bianca Matthews, female   DOB: 12-May-1928, 81 y.o.   MRN: [id number] Complaint:  Visit Type: Patient returns to my office for continued preventative foot care services. Complaint: Patient states" my nails have grown long and thick and become painful to walk and wear shoes" . The patient presents for preventative foot care services. No changes to ROS  Podiatric Exam: Vascular: dorsalis pedis and posterior tibial pulses are palpable bilateral. Capillary return is immediate. Temperature gradient is WNL. Skin turgor WNL  Sensorium: Normal Semmes Weinstein monofilament test. Normal tactile sensation bilaterally. Nail Exam: Pt has thick disfigured discolored nails with subungual debris noted bilateral entire nail hallux  Ulcer Exam: There is no evidence of ulcer or pre-ulcerative changes or infection. Orthopedic Exam: Muscle tone and strength are WNL. No limitations in general ROM. No crepitus or effusions noted. Foot type and digits show no abnormalities. Bony prominences are unremarkable. Skin: No Porokeratosis. No infection or ulcers  Diagnosis:  Onychomycosis, , Pain in right toe, pain in left toes  Treatment & Plan Procedures and Treatment: Consent by patient was obtained for treatment procedures. The patient understood the discussion of treatment and procedures well. All questions were answered thoroughly reviewed. Debridement of mycotic and hypertrophic toenails, 1 through 5 bilateral and clearing of subungual debris. No ulceration, no infection noted.  Return Visit-Office Procedure: Patient instructed to return to the office for a follow up visit 3 months for continued evaluation and treatment.  Gardiner Barefoot DPM

## 2015-10-19 ENCOUNTER — Other Ambulatory Visit: Payer: Self-pay | Admitting: Family Medicine

## 2015-11-24 ENCOUNTER — Other Ambulatory Visit: Payer: Self-pay | Admitting: Family Medicine

## 2015-11-24 MED ORDER — ROSUVASTATIN CALCIUM 10 MG PO TABS: 10.0000 mg | ORAL_TABLET | Freq: Every day | ORAL | Status: DC

## 2015-11-24 NOTE — Telephone Encounter (Signed)
Rx was send

## 2015-11-24 NOTE — Telephone Encounter (Signed)
Pt request refill  CRESTOR 10 MG tablet 90 /day  [facility]  Pt made appointment for 12/14/15

## 2015-12-02 ENCOUNTER — Telehealth: Payer: Self-pay | Admitting: Family Medicine

## 2015-12-02 ENCOUNTER — Encounter: Payer: Self-pay | Admitting: Family Medicine

## 2015-12-02 ENCOUNTER — Ambulatory Visit (INDEPENDENT_AMBULATORY_CARE_PROVIDER_SITE_OTHER): Payer: Medicare PPO | Admitting: Family Medicine

## 2015-12-02 VITALS — BP 148/81 | HR 61 | Ht 64.0 in | Wt 188.0 lb

## 2015-12-02 DIAGNOSIS — R739 Hyperglycemia, unspecified: Secondary | ICD-10-CM | POA: Diagnosis not present

## 2015-12-02 DIAGNOSIS — E1140 Type 2 diabetes mellitus with diabetic neuropathy, unspecified: Secondary | ICD-10-CM | POA: Diagnosis not present

## 2015-12-02 DIAGNOSIS — E785 Hyperlipidemia, unspecified: Secondary | ICD-10-CM | POA: Diagnosis not present

## 2015-12-02 DIAGNOSIS — H109 Unspecified conjunctivitis: Secondary | ICD-10-CM

## 2015-12-02 DIAGNOSIS — I10 Essential (primary) hypertension: Secondary | ICD-10-CM | POA: Diagnosis not present

## 2015-12-02 LAB — HEPATIC FUNCTION PANEL
ALT: 17 U/L (ref 0–35)
AST: 18 U/L (ref 0–37)
Albumin: 4.5 g/dL (ref 3.5–5.2)
Alkaline Phosphatase: 48 U/L (ref 39–117)
Bilirubin, Direct: 0.1 mg/dL (ref 0.0–0.3)
Total Bilirubin: 0.7 mg/dL (ref 0.2–1.2)
Total Protein: 7.5 g/dL (ref 6.0–8.3)

## 2015-12-02 LAB — CBC WITH DIFFERENTIAL/PLATELET
Basophils Absolute: 0.1 10*3/uL (ref 0.0–0.1)
Basophils Relative: 0.8 % (ref 0.0–3.0)
Eosinophils Absolute: 0.2 10*3/uL (ref 0.0–0.7)
Eosinophils Relative: 2.9 % (ref 0.0–5.0)
HCT: 43.8 % (ref 36.0–46.0)
Hemoglobin: 14.7 g/dL (ref 12.0–15.0)
Lymphocytes Relative: 30.2 % (ref 12.0–46.0)
Lymphs Abs: 2.1 10*3/uL (ref 0.7–4.0)
MCHC: 33.5 g/dL (ref 30.0–36.0)
MCV: 91.9 fl (ref 78.0–100.0)
Monocytes Absolute: 0.5 10*3/uL (ref 0.1–1.0)
Monocytes Relative: 6.5 % (ref 3.0–12.0)
Neutro Abs: 4.2 10*3/uL (ref 1.4–7.7)
Neutrophils Relative %: 59.6 % (ref 43.0–77.0)
Platelets: 262 10*3/uL (ref 150.0–400.0)
RBC: 4.77 Mil/uL (ref 3.87–5.11)
RDW: 14.9 % (ref 11.5–15.5)
WBC: 7 10*3/uL (ref 4.0–10.5)

## 2015-12-02 LAB — BASIC METABOLIC PANEL
BUN: 24 mg/dL — ABNORMAL HIGH (ref 6–23)
CO2: 35 mEq/L — ABNORMAL HIGH (ref 19–32)
Calcium: 9.8 mg/dL (ref 8.4–10.5)
Chloride: 94 mEq/L — ABNORMAL LOW (ref 96–112)
Creatinine, Ser: 0.82 mg/dL (ref 0.40–1.20)
GFR: 70.26 mL/min (ref >60.00)
Glucose, Bld: 103 mg/dL — ABNORMAL HIGH (ref 70–99)
Potassium: 3.8 mEq/L (ref 3.5–5.1)
Sodium: 139 mEq/L (ref 135–145)

## 2015-12-02 LAB — LIPID PANEL
Cholesterol: 196 mg/dL (ref 0–200)
HDL: 62.4 mg/dL (ref >39.00)
LDL Cholesterol: 105 mg/dL — ABNORMAL HIGH (ref 0–99)
NonHDL: 133.31
Total CHOL/HDL Ratio: 3
Triglycerides: 140 mg/dL (ref 0.0–149.0)
VLDL: 28 mg/dL (ref 0.0–40.0)

## 2015-12-02 LAB — TSH: TSH: 2.59 u[IU]/mL (ref 0.35–4.50)

## 2015-12-02 LAB — HEMOGLOBIN A1C: Hgb A1c MFr Bld: 6.2 % (ref 4.6–6.5)

## 2015-12-02 MED ORDER — TOBRAMYCIN-DEXAMETHASONE 0.3-0.1 % OP SUSP: 2.0000 [drp] | OPHTHALMIC | Status: DC

## 2015-12-02 MED ORDER — GABAPENTIN 100 MG PO CAPS: 100.0000 mg | ORAL_CAPSULE | Freq: Two times a day (BID) | ORAL | Status: DC

## 2015-12-02 MED ORDER — NEOMYCIN-POLYMYXIN-HC 3.5-10000-1 OP SUSP: 2.0000 [drp] | OPHTHALMIC | Status: DC

## 2015-12-02 MED ORDER — LOSARTAN POTASSIUM-HCTZ 100-12.5 MG PO TABS: 1.0000 | ORAL_TABLET | Freq: Every day | ORAL | Status: DC

## 2015-12-02 NOTE — Telephone Encounter (Signed)
Pharm states pt's tobramycin-dexamethasone (TOBRADEX) ophthalmic solution   Will be $140.00. Is there anything else she can get?  [facility]  Pt at pharmacy now

## 2015-12-02 NOTE — Addendum Note (Signed)
Addended by: Aggie Hacker A on: 12/02/2015 12:18 PM   Modules accepted: Orders

## 2015-12-02 NOTE — Telephone Encounter (Signed)
Per Dr. Sarajane Jews order a refill on Cortisporin and I did call this in.

## 2015-12-02 NOTE — Telephone Encounter (Signed)
This is a duplicate note, see previous one.

## 2015-12-02 NOTE — Progress Notes (Signed)
   Subjective:    Patient ID: Bianca Matthews, female    DOB: 1928-07-13, 82 y.o.   MRN: [id number]  HPI Here to follow up on HTN, lipids, and glucose management. She has 2 complaints today. First she has had more trouble lately with burning and tingling in both feet. We have discussed trying Gabapentin in the past but she was not interested at that time. Now she wants to try this. Second she has had itching and a yellow crusting in the left eye for 2 weeks. Her allergies are acting up a bit with sneezing and runny nose.    Review of Systems  Constitutional: Negative.   HENT: Positive for congestion, postnasal drip, rhinorrhea and sneezing. Negative for sore throat.   Eyes: Positive for discharge and itching. Negative for pain, redness and visual disturbance.  Respiratory: Negative.   Cardiovascular: Negative.   Neurological: Negative.        Objective:   Physical Exam  Constitutional: She is oriented to person, place, and time. She appears well-developed and well-nourished.  HENT:  Right Ear: External ear normal.  Left Ear: External ear normal.  Nose: Nose normal.  Mouth/Throat: Oropharynx is clear and moist.  Eyes: Conjunctivae are normal. Pupils are equal, round, and reactive to light. Right eye exhibits no discharge. Left eye exhibits no discharge.  Neck: Neck supple. No thyromegaly present.  Cardiovascular: Normal rate, regular rhythm, normal heart sounds and intact distal pulses.   Pulmonary/Chest: Effort normal and breath sounds normal.  Lymphadenopathy:    She has no cervical adenopathy.  Neurological: She is alert and oriented to person, place, and time.          Assessment & Plan:  Her HTN is stable. Get fasting labs for glucose and lipid levels. Try Tobradex drops for the conjunctivitis. Try Gabapentin for the neuropathy.

## 2015-12-02 NOTE — Progress Notes (Signed)
Pre visit review using our clinic review tool, if applicable. No additional management support is needed unless otherwise documented below in the visit note. 

## 2015-12-14 ENCOUNTER — Ambulatory Visit: Payer: Medicare PPO | Admitting: Family Medicine

## 2015-12-18 ENCOUNTER — Other Ambulatory Visit: Payer: Self-pay | Admitting: Family Medicine

## 2015-12-20 NOTE — Telephone Encounter (Signed)
Can we refill potassium?

## 2015-12-30 ENCOUNTER — Encounter: Payer: Self-pay | Admitting: Podiatry

## 2015-12-30 ENCOUNTER — Ambulatory Visit (INDEPENDENT_AMBULATORY_CARE_PROVIDER_SITE_OTHER): Payer: Medicare PPO | Admitting: Podiatry

## 2015-12-30 DIAGNOSIS — M79676 Pain in unspecified toe(s): Secondary | ICD-10-CM | POA: Diagnosis not present

## 2015-12-30 DIAGNOSIS — B351 Tinea unguium: Secondary | ICD-10-CM | POA: Diagnosis not present

## 2015-12-30 NOTE — Progress Notes (Signed)
Patient ID: Bianca Matthews, female   DOB: [birth year], 82 y.o.   MRN: [id number] Complaint:  Visit Type: Patient returns to my office for continued preventative foot care services. Complaint: Patient states" my nails have grown long and thick and become painful to walk and wear shoes" . The patient presents for preventative foot care services. No changes to ROS  Podiatric Exam: Vascular: dorsalis pedis and posterior tibial pulses are palpable bilateral. Capillary return is immediate. Temperature gradient is WNL. Skin turgor WNL  Sensorium: Normal Semmes Weinstein monofilament test. Normal tactile sensation bilaterally. Nail Exam: Pt has thick disfigured discolored nails with subungual debris noted bilateral entire nail hallux  Ulcer Exam: There is no evidence of ulcer or pre-ulcerative changes or infection. Orthopedic Exam: Muscle tone and strength are WNL. No limitations in general ROM. No crepitus or effusions noted. Foot type and digits show no abnormalities. Bony prominences are unremarkable. Skin: No Porokeratosis. No infection or ulcers  Diagnosis:  Onychomycosis, , Pain in right toe, pain in left toes  Treatment & Plan Procedures and Treatment: Consent by patient was obtained for treatment procedures. The patient understood the discussion of treatment and procedures well. All questions were answered thoroughly reviewed. Debridement of mycotic and hypertrophic toenails, 1 through 5 bilateral and clearing of subungual debris. No ulceration, no infection noted.  Return Visit-Office Procedure: Patient instructed to return to the office for a follow up visit 3 months for continued evaluation and treatment.  Gardiner Barefoot DPM

## 2016-04-26 ENCOUNTER — Ambulatory Visit (INDEPENDENT_AMBULATORY_CARE_PROVIDER_SITE_OTHER): Payer: Medicare PPO | Admitting: Podiatry

## 2016-04-26 ENCOUNTER — Encounter: Payer: Self-pay | Admitting: Podiatry

## 2016-04-26 DIAGNOSIS — M79676 Pain in unspecified toe(s): Secondary | ICD-10-CM | POA: Diagnosis not present

## 2016-04-26 DIAGNOSIS — B351 Tinea unguium: Secondary | ICD-10-CM | POA: Diagnosis not present

## 2016-04-26 NOTE — Progress Notes (Signed)
Patient ID: Bianca Matthews, female   DOB: 1928-09-14, 82 y.o.   MRN: [id number] Complaint:  Visit Type: Patient returns to my office for continued preventative foot care services. Complaint: Patient states" my nails have grown long and thick and become painful to walk and wear shoes" . The patient presents for preventative foot care services. No changes to ROS  Podiatric Exam: Vascular: dorsalis pedis and posterior tibial pulses are palpable bilateral. Capillary return is immediate. Temperature gradient is WNL. Skin turgor WNL  Sensorium: Normal Semmes Weinstein monofilament test. Normal tactile sensation bilaterally. Nail Exam: Pt has thick disfigured discolored nails with subungual debris noted bilateral entire nail hallux  Ulcer Exam: There is no evidence of ulcer or pre-ulcerative changes or infection. Orthopedic Exam: Muscle tone and strength are WNL. No limitations in general ROM. No crepitus or effusions noted. Foot type and digits show no abnormalities. Bony prominences are unremarkable. Skin: No Porokeratosis. No infection or ulcers  Diagnosis:  Onychomycosis, , Pain in right toe, pain in left toes  Treatment & Plan Procedures and Treatment: Consent by patient was obtained for treatment procedures. The patient understood the discussion of treatment and procedures well. All questions were answered thoroughly reviewed. Debridement of mycotic and hypertrophic toenails, 1 through 5 bilateral and clearing of subungual debris. No ulceration, no infection noted.  Return Visit-Office Procedure: Patient instructed to return to the office for a follow up visit 3 months for continued evaluation and treatment.  Gardiner Barefoot DPM

## 2016-05-05 ENCOUNTER — Other Ambulatory Visit: Payer: Self-pay | Admitting: Family Medicine

## 2016-05-19 ENCOUNTER — Encounter: Payer: Self-pay | Admitting: Family Medicine

## 2016-05-19 ENCOUNTER — Ambulatory Visit (INDEPENDENT_AMBULATORY_CARE_PROVIDER_SITE_OTHER): Payer: Medicare PPO | Admitting: Family Medicine

## 2016-05-19 VITALS — BP 161/84 | HR 64 | Temp 97.7°F | Ht 64.0 in | Wt 191.0 lb

## 2016-05-19 DIAGNOSIS — I10 Essential (primary) hypertension: Secondary | ICD-10-CM

## 2016-05-19 MED ORDER — METOPROLOL SUCCINATE ER 100 MG PO TB24: 100.0000 mg | ORAL_TABLET | Freq: Every day | ORAL | 3 refills | Status: DC

## 2016-05-19 MED ORDER — FUROSEMIDE 40 MG PO TABS: 40.0000 mg | ORAL_TABLET | Freq: Three times a day (TID) | ORAL | 3 refills | Status: DC

## 2016-05-19 MED ORDER — ROSUVASTATIN CALCIUM 10 MG PO TABS: 10.0000 mg | ORAL_TABLET | Freq: Every day | ORAL | 3 refills | Status: DC

## 2016-05-19 MED ORDER — LOSARTAN POTASSIUM-HCTZ 100-12.5 MG PO TABS: 1.0000 | ORAL_TABLET | Freq: Every day | ORAL | 3 refills | Status: DC

## 2016-05-19 NOTE — Progress Notes (Signed)
   Subjective:    Patient ID: Bianca Matthews, female    DOB: 01-15-1928, 82 y.o.   MRN: [id number]  HPI Here to follow up on HTN. She feels great and has no concerns. Her BP has been stable. However her pharmacy notified her that Atenolol is no longer available from the manufacturer.    Review of Systems  Constitutional: Negative.   Respiratory: Negative.   Cardiovascular: Negative.   Neurological: Negative.        Objective:   Physical Exam  Constitutional: She is oriented to person, place, and time. She appears well-developed and well-nourished.  Cardiovascular: Normal rate, regular rhythm, normal heart sounds and intact distal pulses.   Pulmonary/Chest: Effort normal and breath sounds normal.  Neurological: She is alert and oriented to person, place, and time.          Assessment & Plan:  HTN is stable. We will switch from Atenolol to Metoprolol succinate 100 mg daily.  Laurey Morale, MD

## 2016-05-19 NOTE — Progress Notes (Signed)
Pre visit review using our clinic review tool, if applicable. No additional management support is needed unless otherwise documented below in the visit note. 

## 2016-07-13 ENCOUNTER — Encounter: Payer: Self-pay | Admitting: Family Medicine

## 2016-07-13 ENCOUNTER — Ambulatory Visit (INDEPENDENT_AMBULATORY_CARE_PROVIDER_SITE_OTHER): Payer: Medicare PPO | Admitting: Family Medicine

## 2016-07-13 VITALS — BP 148/87 | HR 60 | Temp 97.8°F | Ht 64.0 in | Wt 186.0 lb

## 2016-07-13 DIAGNOSIS — J209 Acute bronchitis, unspecified: Secondary | ICD-10-CM

## 2016-07-13 MED ORDER — AZITHROMYCIN 250 MG PO TABS: ORAL_TABLET | ORAL | 0 refills | Status: DC

## 2016-07-13 NOTE — Progress Notes (Signed)
   Subjective:    Patient ID: Bianca Matthews, female    DOB: 06/08/1928, 82 y.o.   MRN: [id number]  HPI Here for 5 days of PND, chest tightness and a dry cough. No fever.    Review of Systems  Constitutional: Negative.   HENT: Positive for congestion and postnasal drip. Negative for sinus pressure and sore throat.   Eyes: Negative.   Respiratory: Positive for cough.        Objective:   Physical Exam  Constitutional: She appears well-developed and well-nourished.  HENT:  Right Ear: External ear normal.  Left Ear: External ear normal.  Nose: Nose normal.  Mouth/Throat: Oropharynx is clear and moist.  Eyes: Conjunctivae are normal.  Neck: No thyromegaly present.  Pulmonary/Chest: Effort normal. No respiratory distress. She has no wheezes. She has no rales.  Scattered rhonchi   Lymphadenopathy:    She has no cervical adenopathy.          Assessment & Plan:  Bronchitis, treat with a Zpack. Add Delsym prn.  Laurey Morale, MD

## 2016-07-13 NOTE — Progress Notes (Signed)
Pre visit review using our clinic review tool, if applicable. No additional management support is needed unless otherwise documented below in the visit note. 

## 2016-07-27 ENCOUNTER — Encounter: Payer: Self-pay | Admitting: Podiatry

## 2016-07-27 ENCOUNTER — Ambulatory Visit (INDEPENDENT_AMBULATORY_CARE_PROVIDER_SITE_OTHER): Payer: Medicare PPO | Admitting: Podiatry

## 2016-07-27 VITALS — Ht 64.0 in | Wt 186.0 lb

## 2016-07-27 DIAGNOSIS — M79676 Pain in unspecified toe(s): Secondary | ICD-10-CM | POA: Diagnosis not present

## 2016-07-27 DIAGNOSIS — B351 Tinea unguium: Secondary | ICD-10-CM

## 2016-07-27 NOTE — Progress Notes (Signed)
Patient ID: Florestine Avers, female   DOB: Jan 23, 1928, 82 y.o.   MRN: [id number] Complaint:  Visit Type: Patient returns to my office for continued preventative foot care services. Complaint: Patient states" my nails have grown long and thick and become painful to walk and wear shoes" . The patient presents for preventative foot care services. No changes to ROS  Podiatric Exam: Vascular: dorsalis pedis and posterior tibial pulses are palpable bilateral. Capillary return is immediate. Temperature gradient is WNL. Skin turgor WNL  Sensorium: Normal Semmes Weinstein monofilament test. Normal tactile sensation bilaterally. Nail Exam: Pt has thick disfigured discolored nails with subungual debris noted bilateral entire nail hallux  Ulcer Exam: There is no evidence of ulcer or pre-ulcerative changes or infection. Orthopedic Exam: Muscle tone and strength are WNL. No limitations in general ROM. No crepitus or effusions noted. Foot type and digits show no abnormalities. Bony prominences are unremarkable. Skin: No Porokeratosis. No infection or ulcers  Diagnosis:  Onychomycosis, , Pain in right toe, pain in left toes  Treatment & Plan Procedures and Treatment: Consent by patient was obtained for treatment procedures. The patient understood the discussion of treatment and procedures well. All questions were answered thoroughly reviewed. Debridement of mycotic and hypertrophic toenails, 1 through 5 bilateral and clearing of subungual debris. No ulceration, no infection noted.  Return Visit-Office Procedure: Patient instructed to return to the office for a follow up visit 3 months for continued evaluation and treatment.  Gardiner Barefoot DPM

## 2016-08-03 ENCOUNTER — Telehealth: Payer: Self-pay | Admitting: Family Medicine

## 2016-08-03 ENCOUNTER — Ambulatory Visit (INDEPENDENT_AMBULATORY_CARE_PROVIDER_SITE_OTHER): Payer: Medicare PPO | Admitting: Family Medicine

## 2016-08-03 DIAGNOSIS — H9190 Unspecified hearing loss, unspecified ear: Secondary | ICD-10-CM

## 2016-08-03 DIAGNOSIS — Z23 Encounter for immunization: Secondary | ICD-10-CM

## 2016-08-03 NOTE — Telephone Encounter (Signed)
Pt is having trouble hearing and would like to be referral to ent or audiologist for hearing exam

## 2016-08-03 NOTE — Telephone Encounter (Signed)
Referral was done  

## 2016-08-04 NOTE — Telephone Encounter (Signed)
I tried to reach pt no answer or option to leave a message.

## 2016-10-08 ENCOUNTER — Emergency Department (HOSPITAL_COMMUNITY): Payer: Medicare PPO

## 2016-10-08 ENCOUNTER — Inpatient Hospital Stay (HOSPITAL_COMMUNITY)
Admission: EM | Admit: 2016-10-08 | Discharge: 2016-10-11 | DRG: 291 | Disposition: A | Discharge status: Home Health | Payer: Medicare PPO | Attending: Internal Medicine | Admitting: Internal Medicine

## 2016-10-08 ENCOUNTER — Encounter (HOSPITAL_COMMUNITY): Payer: Self-pay | Admitting: Emergency Medicine

## 2016-10-08 DIAGNOSIS — I5032 Chronic diastolic (congestive) heart failure: Secondary | ICD-10-CM | POA: Diagnosis present

## 2016-10-08 DIAGNOSIS — M542 Cervicalgia: Secondary | ICD-10-CM | POA: Diagnosis present

## 2016-10-08 DIAGNOSIS — Z66 Do not resuscitate: Secondary | ICD-10-CM | POA: Diagnosis present

## 2016-10-08 DIAGNOSIS — I10 Essential (primary) hypertension: Secondary | ICD-10-CM | POA: Diagnosis present

## 2016-10-08 DIAGNOSIS — S020XXA Fracture of vault of skull, initial encounter for closed fracture: Secondary | ICD-10-CM | POA: Diagnosis present

## 2016-10-08 DIAGNOSIS — I110 Hypertensive heart disease with heart failure: Principal | ICD-10-CM | POA: Diagnosis present

## 2016-10-08 DIAGNOSIS — Z87891 Personal history of nicotine dependence: Secondary | ICD-10-CM

## 2016-10-08 DIAGNOSIS — R739 Hyperglycemia, unspecified: Secondary | ICD-10-CM | POA: Diagnosis present

## 2016-10-08 DIAGNOSIS — G629 Polyneuropathy, unspecified: Secondary | ICD-10-CM | POA: Diagnosis present

## 2016-10-08 DIAGNOSIS — I509 Heart failure, unspecified: Secondary | ICD-10-CM

## 2016-10-08 DIAGNOSIS — E876 Hypokalemia: Secondary | ICD-10-CM | POA: Diagnosis present

## 2016-10-08 DIAGNOSIS — I5031 Acute diastolic (congestive) heart failure: Secondary | ICD-10-CM | POA: Diagnosis present

## 2016-10-08 DIAGNOSIS — S0291XA Unspecified fracture of skull, initial encounter for closed fracture: Secondary | ICD-10-CM | POA: Diagnosis present

## 2016-10-08 DIAGNOSIS — E785 Hyperlipidemia, unspecified: Secondary | ICD-10-CM | POA: Diagnosis present

## 2016-10-08 DIAGNOSIS — R0902 Hypoxemia: Secondary | ICD-10-CM | POA: Diagnosis present

## 2016-10-08 DIAGNOSIS — J9601 Acute respiratory failure with hypoxia: Secondary | ICD-10-CM | POA: Diagnosis present

## 2016-10-08 DIAGNOSIS — K219 Gastro-esophageal reflux disease without esophagitis: Secondary | ICD-10-CM | POA: Diagnosis present

## 2016-10-08 DIAGNOSIS — Z7982 Long term (current) use of aspirin: Secondary | ICD-10-CM | POA: Diagnosis not present

## 2016-10-08 DIAGNOSIS — J189 Pneumonia, unspecified organism: Secondary | ICD-10-CM | POA: Diagnosis present

## 2016-10-08 DIAGNOSIS — Z8249 Family history of ischemic heart disease and other diseases of the circulatory system: Secondary | ICD-10-CM | POA: Diagnosis not present

## 2016-10-08 DIAGNOSIS — S0101XA Laceration without foreign body of scalp, initial encounter: Secondary | ICD-10-CM | POA: Diagnosis present

## 2016-10-08 DIAGNOSIS — Z79899 Other long term (current) drug therapy: Secondary | ICD-10-CM | POA: Diagnosis not present

## 2016-10-08 DIAGNOSIS — S060X9A Concussion with loss of consciousness of unspecified duration, initial encounter: Secondary | ICD-10-CM

## 2016-10-08 DIAGNOSIS — S060XAA Unspecified fracture of skull, initial encounter for closed fracture: Secondary | ICD-10-CM | POA: Diagnosis present

## 2016-10-08 DIAGNOSIS — Y92098 Other place in other non-institutional residence as the place of occurrence of the external cause: Secondary | ICD-10-CM

## 2016-10-08 DIAGNOSIS — S02119A Unspecified fracture of occiput, initial encounter for closed fracture: Secondary | ICD-10-CM | POA: Diagnosis present

## 2016-10-08 DIAGNOSIS — W19XXXA Unspecified fall, initial encounter: Secondary | ICD-10-CM

## 2016-10-08 DIAGNOSIS — W108XXA Fall (on) (from) other stairs and steps, initial encounter: Secondary | ICD-10-CM | POA: Diagnosis present

## 2016-10-08 DIAGNOSIS — S060X0A Concussion without loss of consciousness, initial encounter: Secondary | ICD-10-CM | POA: Diagnosis present

## 2016-10-08 LAB — CBC
HCT: 38.5 % (ref 36.0–46.0)
Hemoglobin: 12.5 g/dL (ref 12.0–15.0)
MCH: 30.1 pg (ref 26.0–34.0)
MCHC: 32.5 g/dL (ref 30.0–36.0)
MCV: 92.8 fL (ref 78.0–100.0)
Platelets: 210 10*3/uL (ref 150–400)
RBC: 4.15 MIL/uL (ref 3.87–5.11)
RDW: 14.3 % (ref 11.5–15.5)
WBC: 9.8 10*3/uL (ref 4.0–10.5)

## 2016-10-08 LAB — BASIC METABOLIC PANEL
Anion gap: 9 (ref 5–15)
BUN: 26 mg/dL — ABNORMAL HIGH (ref 6–20)
CO2: 29 mmol/L (ref 22–32)
Calcium: 9.3 mg/dL (ref 8.9–10.3)
Chloride: 96 mmol/L — ABNORMAL LOW (ref 101–111)
Creatinine, Ser: 0.79 mg/dL (ref 0.44–1.00)
GFR calc Af Amer: >60 mL/min (ref >60)
GFR calc non Af Amer: >60 mL/min (ref >60)
Glucose, Bld: 180 mg/dL — ABNORMAL HIGH (ref 65–99)
Potassium: 3 mmol/L — ABNORMAL LOW (ref 3.5–5.1)
Sodium: 134 mmol/L — ABNORMAL LOW (ref 135–145)

## 2016-10-08 LAB — BRAIN NATRIURETIC PEPTIDE: B Natriuretic Peptide: 296.9 pg/mL — ABNORMAL HIGH (ref 0.0–100.0)

## 2016-10-08 MED ORDER — LOSARTAN POTASSIUM-HCTZ 100-12.5 MG PO TABS: 1.0000 | ORAL_TABLET | Freq: Every day | ORAL | Status: DC

## 2016-10-08 MED ORDER — ONDANSETRON HCL 4 MG/2ML IJ SOLN: 4.0000 mg | Freq: Four times a day (QID) | INTRAMUSCULAR | Status: DC | PRN

## 2016-10-08 MED ORDER — ASPIRIN 325 MG PO TABS
325.0000 mg | ORAL_TABLET | Freq: Every evening | ORAL | Status: DC
  Administered 2016-10-08 – 2016-10-10 (×3): 325 mg via ORAL
  Filled 2016-10-08 (×3): qty 1

## 2016-10-08 MED ORDER — ONDANSETRON 4 MG PO TBDP: 4.0000 mg | ORAL_TABLET | Freq: Three times a day (TID) | ORAL | 0 refills | Status: DC | PRN

## 2016-10-08 MED ORDER — MECLIZINE HCL 25 MG PO TABS: 25.0000 mg | ORAL_TABLET | Freq: Three times a day (TID) | ORAL | Status: DC | PRN

## 2016-10-08 MED ORDER — POTASSIUM CHLORIDE CRYS ER 20 MEQ PO TBCR
40.0000 meq | EXTENDED_RELEASE_TABLET | Freq: Once | ORAL | Status: AC
  Administered 2016-10-08: 40 meq via ORAL
  Filled 2016-10-08: qty 2

## 2016-10-08 MED ORDER — ACETAMINOPHEN 325 MG PO TABS
650.0000 mg | ORAL_TABLET | ORAL | Status: DC | PRN
  Administered 2016-10-10: 650 mg via ORAL
  Filled 2016-10-08: qty 2

## 2016-10-08 MED ORDER — ENOXAPARIN SODIUM 40 MG/0.4ML ~~LOC~~ SOLN
40.0000 mg | SUBCUTANEOUS | Status: DC
  Filled 2016-10-08: qty 0.4

## 2016-10-08 MED ORDER — ONDANSETRON HCL 4 MG/2ML IJ SOLN: 4.0000 mg | Freq: Once | INTRAMUSCULAR | Status: DC

## 2016-10-08 MED ORDER — SODIUM CHLORIDE 0.9% FLUSH
3.0000 mL | Freq: Two times a day (BID) | INTRAVENOUS | Status: DC
  Administered 2016-10-08 – 2016-10-11 (×6): 3 mL via INTRAVENOUS

## 2016-10-08 MED ORDER — HYDROCODONE-ACETAMINOPHEN 5-325 MG PO TABS
1.0000 | ORAL_TABLET | Freq: Once | ORAL | Status: AC
  Administered 2016-10-08: 1 via ORAL
  Filled 2016-10-08: qty 1

## 2016-10-08 MED ORDER — ROSUVASTATIN CALCIUM 10 MG PO TABS
10.0000 mg | ORAL_TABLET | Freq: Every evening | ORAL | Status: DC
  Administered 2016-10-08 – 2016-10-10 (×3): 10 mg via ORAL
  Filled 2016-10-08 (×3): qty 1

## 2016-10-08 MED ORDER — FUROSEMIDE 10 MG/ML IJ SOLN
40.0000 mg | Freq: Once | INTRAMUSCULAR | Status: AC
  Administered 2016-10-08: 40 mg via INTRAVENOUS
  Filled 2016-10-08: qty 4

## 2016-10-08 MED ORDER — ATENOLOL 100 MG PO TABS
100.0000 mg | ORAL_TABLET | Freq: Two times a day (BID) | ORAL | Status: DC
  Administered 2016-10-08 – 2016-10-11 (×6): 100 mg via ORAL
  Filled 2016-10-08 (×7): qty 1

## 2016-10-08 MED ORDER — SODIUM CHLORIDE 0.9 % IV SOLN: 250.0000 mL | INTRAVENOUS | Status: DC | PRN

## 2016-10-08 MED ORDER — FUROSEMIDE 10 MG/ML IJ SOLN: 40.0000 mg | Freq: Every day | INTRAMUSCULAR | Status: DC

## 2016-10-08 MED ORDER — POTASSIUM CHLORIDE CRYS ER 20 MEQ PO TBCR: 20.0000 meq | EXTENDED_RELEASE_TABLET | Freq: Every day | ORAL | Status: DC

## 2016-10-08 MED ORDER — SODIUM CHLORIDE 0.9% FLUSH: 3.0000 mL | INTRAVENOUS | Status: DC | PRN

## 2016-10-08 MED ORDER — PANTOPRAZOLE SODIUM 40 MG PO TBEC
40.0000 mg | DELAYED_RELEASE_TABLET | Freq: Every day | ORAL | Status: DC
  Administered 2016-10-09 – 2016-10-11 (×3): 40 mg via ORAL
  Filled 2016-10-08 (×3): qty 1

## 2016-10-08 MED ORDER — MECLIZINE HCL 25 MG PO TABS
12.5000 mg | ORAL_TABLET | Freq: Once | ORAL | Status: AC
  Administered 2016-10-08: 12.5 mg via ORAL
  Filled 2016-10-08: qty 1

## 2016-10-08 MED ORDER — ONDANSETRON 4 MG PO TBDP
4.0000 mg | ORAL_TABLET | Freq: Once | ORAL | Status: AC
  Administered 2016-10-08: 4 mg via ORAL
  Filled 2016-10-08: qty 1

## 2016-10-08 MED ORDER — MECLIZINE HCL 12.5 MG PO TABS: 12.5000 mg | ORAL_TABLET | Freq: Two times a day (BID) | ORAL | 0 refills | Status: DC | PRN

## 2016-10-08 MED ORDER — LOSARTAN POTASSIUM 50 MG PO TABS
100.0000 mg | ORAL_TABLET | Freq: Every day | ORAL | Status: DC
  Administered 2016-10-09 – 2016-10-11 (×3): 100 mg via ORAL
  Filled 2016-10-08 (×3): qty 2

## 2016-10-08 MED ORDER — HYDROCHLOROTHIAZIDE 12.5 MG PO CAPS
12.5000 mg | ORAL_CAPSULE | Freq: Every day | ORAL | Status: DC
  Administered 2016-10-09: 12.5 mg via ORAL
  Filled 2016-10-08: qty 1

## 2016-10-08 MED ORDER — HYDROCODONE-ACETAMINOPHEN 5-325 MG PO TABS: 1.0000 | ORAL_TABLET | Freq: Four times a day (QID) | ORAL | 0 refills | Status: DC | PRN

## 2016-10-08 NOTE — ED Provider Notes (Signed)
[facility] DEPT Provider Note   CSN: [id number] Arrival date & time: 10/08/16  1423     History   Chief Complaint Chief Complaint  Patient presents with  . Fall  . Laceration    HPI Bianca Matthews is a 83 y.o. female.  Patient is a 57 female who presents after sustaining a fall and hitting the back of her hand. Patient states she was going into the garage where there was a very small stepdown when she fell and struck her head. Patient denies any prodromal symptoms such as lightheadedness, palpitations, chest pain. Patient isn't exactly sure how she fell. She denies any loss of consciousness and a son who is close by states she was awake when the got to her shortly after the fall. Patient complains of nausea, headache, lightheadedness however after the fall. Patient takes aspirin but no other blood thinners. Patient has mild neck pain but denies any back pain. She also denies any numbness or weakness.   The history is provided by the patient and a relative.  Fall  This is a new problem. The current episode started 1 to 2 hours ago. Associated symptoms include headaches (after fall). Pertinent negatives include no chest pain, no abdominal pain and no shortness of breath.    Past Medical History:  Diagnosis Date  . Allergy    sees Dr. [name]  . GERD (gastroesophageal reflux disease)   . Hyperlipidemia   . Hypertension   . Varicose veins     Patient Active Problem List   Diagnosis Date Noted  . Hyperlipidemia 12/02/2015  . Neuropathy due to type 2 diabetes mellitus ([place]) 12/02/2015  . Hyperglycemia 02/24/2015  . Other and unspecified hyperlipidemia 06/16/2014  . Low back pain 06/16/2014  . Anxiety state 01/30/2014  . Rash 03/14/2011  . ANKLE PAIN 08/31/2010  . ARTHRITIS, GENERALIZED 06/27/2010  . ACUTE BRONCHITIS 11/09/2009  . ACUTE SINUSITIS, UNSPECIFIED 08/25/2008  . ALLERGIC RHINITIS 05/06/2008  . DEPENDENT EDEMA 01/29/2008  . IDIOPATHIC URTICARIA 09/16/2007  .  Essential hypertension 06/11/2007  . GERD 06/11/2007    Past Surgical History:  Procedure Laterality Date  . APPENDECTOMY    . TONSILLECTOMY      OB History    Gravida Para Term Preterm AB Living   3 3           SAB TAB Ectopic Multiple Live Births                   Home Medications    Prior to Admission medications   Medication Sig Start Date End Date Taking? Authorizing Provider  aspirin 325 MG tablet Take 325 mg by mouth daily.    Historical Provider, MD  azithromycin (ZITHROMAX) 250 MG tablet As directed 07/13/16   Laurey Morale, MD  furosemide (LASIX) 40 MG tablet Take 1 tablet (40 mg total) by mouth 3 (three) times daily. 05/19/16   Laurey Morale, MD  gabapentin (NEURONTIN) 100 MG capsule Take 1 capsule (100 mg total) by mouth 2 (two) times daily. Patient not taking: Reported on 07/13/2016 12/02/15   Laurey Morale, MD  HYDROcodone-acetaminophen [facility]) 5-325 MG tablet Take 1 tablet by mouth every 6 (six) hours as needed for severe pain. Take tylenol for mild to moderate pain. Take Norco for severe pain. 10/08/16   Tobie Poet, DO  losartan-hydrochlorothiazide (HYZAAR) 100-12.5 MG tablet Take 1 tablet by mouth daily. 05/19/16   Laurey Morale, MD  meclizine (ANTIVERT) 12.5 MG tablet Take 1 tablet (12.5  mg total) by mouth 2 (two) times daily as needed for dizziness. 10/08/16 10/15/16  Tobie Poet, DO  metoprolol succinate (TOPROL-XL) 100 MG 24 hr tablet Take 1 tablet (100 mg total) by mouth daily. Take with or immediately following a meal. 05/19/16   Laurey Morale, MD  Misc Natural Products (MULTI-HERB PO) Take by mouth. TRIPALA.     Historical Provider, MD  Multiple Vitamins-Minerals (ONE-A-DAY 50 PLUS PO) Take 1 each by mouth daily.      Historical Provider, MD  omeprazole (PRILOSEC) 40 MG capsule TAKE 1 CAPSULE EVERY DAY 12/21/15   Laurey Morale, MD  ondansetron (ZOFRAN ODT) 4 MG disintegrating tablet Take 1 tablet (4 mg total) by mouth every 8 (eight) hours as needed for nausea or  vomiting. 10/08/16 10/13/16  Tobie Poet, DO  potassium chloride SA (K-DUR,KLOR-CON) 20 MEQ tablet TAKE 1 TABLET EVERY DAY 12/21/15   Laurey Morale, MD  Probiotic Product (PROBIOTIC FORMULA PO) Take 1 each by mouth daily.      Historical Provider, MD  rosuvastatin (CRESTOR) 10 MG tablet Take 1 tablet (10 mg total) by mouth at bedtime. 05/19/16   Laurey Morale, MD    Family History Family History  Problem Relation Age of Onset  . Hypertension    . Heart disease      Social History Social History  Substance Use Topics  . Smoking status: Former Research scientist (life sciences)  . Smokeless tobacco: Never Used  . Alcohol use 0.0 oz/week     Comment: occ     Allergies   Patient has no known allergies.   Review of Systems Review of Systems  Constitutional: Negative for fever.  Eyes: Negative for visual disturbance (feels "eye strain").  Respiratory: Negative for shortness of breath.   Cardiovascular: Negative for chest pain and palpitations.  Gastrointestinal: Positive for nausea. Negative for abdominal pain and vomiting.  Musculoskeletal: Positive for neck pain (left sided). Negative for back pain.  Skin: Positive for wound (back of head).  Neurological: Positive for light-headedness and headaches (after fall). Negative for seizures, syncope, weakness and numbness.  All other systems reviewed and are negative.    Physical Exam Updated Vital Signs BP 149/55 (BP Location: Right Arm)   Pulse 64   Resp 19   SpO2 95%   Physical Exam  Constitutional: She appears well-developed and well-nourished. No distress.  HENT:  Head: Normocephalic. Head is with contusion and with laceration. Head is without Battle's sign.    Mouth/Throat: Oropharynx is clear and moist.  Eyes: Conjunctivae and EOM are normal. Pupils are equal, round, and reactive to light.  Neck: Neck supple.  Cardiovascular: Normal rate, regular rhythm and normal heart sounds.   No murmur heard. Pulmonary/Chest: Effort normal and breath sounds  normal. No respiratory distress. She has no wheezes. She has no rales.  Abdominal: Soft. She exhibits no distension. There is no tenderness.  Musculoskeletal: She exhibits no edema or deformity.       Cervical back: She exhibits tenderness (left paraspinal).  No T/L spine tenderness. Extremities atraumatic  Neurological: She is alert.  Skin: Skin is warm and dry.  Psychiatric: She has a normal mood and affect.  Nursing note and vitals reviewed.    ED Treatments / Results  Labs (all labs ordered are listed, but only abnormal results are displayed) Labs Reviewed  BASIC METABOLIC PANEL - Abnormal; Notable for the following:       Result Value   Sodium 134 (*)    Potassium 3.0 (*)  Chloride 96 (*)    Glucose, Bld 180 (*)    BUN 26 (*)    All other components within normal limits  CBC  BRAIN NATRIURETIC PEPTIDE    EKG  EKG Interpretation  Date/Time:  Sunday October 08 2016 16:31:42 EST Ventricular Rate:  56 PR Interval:    QRS Duration: 91 QT Interval:  423 QTC Calculation: 409 R Axis:   69 Text Interpretation:  Sinus rhythm Atrial premature complex No previous tracing Confirmed by Ashok Cordia  MD, Lennette Bihari ([id number]) on 10/08/2016 4:33:55 PM       Radiology Ct Head Wo Contrast  Result Date: 10/08/2016 CLINICAL DATA:  FALL THIS AM WITHOUT LOC PER PATIENT PATIENT FELL AND STRUCK OCCIPITAL PORTION OF HEAD HEADACHE AND DIZZINESS SINCE FALL AND NECK PAIN PMH: HTN, Allergy; GERD ; Hyperlipidemia; Varicose veins EXAM: CT HEAD WITHOUT CONTRAST CT CERVICAL SPINE WITHOUT CONTRAST TECHNIQUE: Multidetector CT imaging of the head and cervical spine was performed following the standard protocol without intravenous contrast. Multiplanar CT image reconstructions of the cervical spine were also generated. COMPARISON:  None. FINDINGS: CT HEAD FINDINGS Brain: No evidence of acute infarction, hemorrhage, hydrocephalus, extra-axial collection or mass lesion/mass effect. The ventricles and sulci are enlarged  reflecting age appropriate volume loss. Minor periventricular white matter hypoattenuation is noted consistent with chronic microvascular ischemic change. Vascular: No hyperdense vessel or unexpected calcification. Skull: There is a nondisplaced, nondepressed fracture of the right posterior parietal and occipital bone lying adjacent to the lambdoid suture. Sinuses/Orbits: Globes orbits are unremarkable. Sinuses and mastoid air cells are clear. Other: Right posterior parietal scalp hematoma. CT CERVICAL SPINE FINDINGS Alignment: Normal. Skull base and vertebrae: No acute fracture. No primary bone lesion or focal pathologic process. Soft tissues and spinal canal: No prevertebral fluid or swelling. No visible canal hematoma. Disc levels: The disc spaces are well maintained. No disc herniations or significant disc bulging. Facet degenerative changes noted on the left at C2-C3. Spinal canal and neural foramina are widely patent. Upper chest: Unremarkable. Other: None IMPRESSION: HEAD CT 1. No acute intracranial abnormality.  No intracranial hemorrhage. 2. Nondisplaced, nondepressed right posterior parietal/occipital skull fracture. This lies underneath the right posterior parietal scalp hematoma. CERVICAL CT 1. No fracture or acute finding. Electronically Signed   By: Lajean Manes M.D.   On: 10/08/2016 16:14   Ct Cervical Spine Wo Contrast  Result Date: 10/08/2016 CLINICAL DATA:  FALL THIS AM WITHOUT LOC PER PATIENT PATIENT FELL AND STRUCK OCCIPITAL PORTION OF HEAD HEADACHE AND DIZZINESS SINCE FALL AND NECK PAIN PMH: HTN, Allergy; GERD ; Hyperlipidemia; Varicose veins EXAM: CT HEAD WITHOUT CONTRAST CT CERVICAL SPINE WITHOUT CONTRAST TECHNIQUE: Multidetector CT imaging of the head and cervical spine was performed following the standard protocol without intravenous contrast. Multiplanar CT image reconstructions of the cervical spine were also generated. COMPARISON:  None. FINDINGS: CT HEAD FINDINGS Brain: No evidence  of acute infarction, hemorrhage, hydrocephalus, extra-axial collection or mass lesion/mass effect. The ventricles and sulci are enlarged reflecting age appropriate volume loss. Minor periventricular white matter hypoattenuation is noted consistent with chronic microvascular ischemic change. Vascular: No hyperdense vessel or unexpected calcification. Skull: There is a nondisplaced, nondepressed fracture of the right posterior parietal and occipital bone lying adjacent to the lambdoid suture. Sinuses/Orbits: Globes orbits are unremarkable. Sinuses and mastoid air cells are clear. Other: Right posterior parietal scalp hematoma. CT CERVICAL SPINE FINDINGS Alignment: Normal. Skull base and vertebrae: No acute fracture. No primary bone lesion or focal pathologic process. Soft tissues and spinal canal:  No prevertebral fluid or swelling. No visible canal hematoma. Disc levels: The disc spaces are well maintained. No disc herniations or significant disc bulging. Facet degenerative changes noted on the left at C2-C3. Spinal canal and neural foramina are widely patent. Upper chest: Unremarkable. Other: None IMPRESSION: HEAD CT 1. No acute intracranial abnormality.  No intracranial hemorrhage. 2. Nondisplaced, nondepressed right posterior parietal/occipital skull fracture. This lies underneath the right posterior parietal scalp hematoma. CERVICAL CT 1. No fracture or acute finding. Electronically Signed   By: Lajean Manes M.D.   On: 10/08/2016 16:14   Dg Chest Portable 1 View  Result Date: 10/08/2016 CLINICAL DATA:  From home via EMS: reports tripped over step in garage and fell. Pt had a destat episode, R/o pneumothorax EXAM: PORTABLE CHEST 1 VIEW COMPARISON:  None. FINDINGS: Cardiac silhouette is mildly enlarged. No convincing mediastinal or hilar masses. Opacity extends from the region of the right hilum obliquely to the right lateral lung base obscuring hemidiaphragm. This is likely atelectasis. Consider pneumonia if  there are consistent clinical symptoms. There are mildly prominent bronchovascular markings bilaterally. No convincing pulmonary edema. No pneumothorax. Skeletal structures are demineralized but grossly intact. IMPRESSION: 1. No pneumothorax. 2. Right lung base opacity which may reflect atelectasis. Pneumonia is possible. 3. No pulmonary edema.  Mild cardiomegaly. Electronically Signed   By: Lajean Manes M.D.   On: 10/08/2016 17:57    Procedures Procedures (including critical care time)  Medications Ordered in ED Medications  HYDROcodone-acetaminophen (NORCO/VICODIN) 5-325 MG per tablet 1 tablet (1 tablet Oral Given 10/08/16 1623)  ondansetron (ZOFRAN-ODT) disintegrating tablet 4 mg (4 mg Oral Given 10/08/16 1623)  meclizine (ANTIVERT) tablet 12.5 mg (12.5 mg Oral Given 10/08/16 1725)     Initial Impression / Assessment and Plan / ED Course  I have reviewed the triage vital signs and the nursing notes.  Pertinent labs & imaging results that were available during my care of the patient were reviewed by me and considered in my medical decision making (see chart for details).  Clinical Course    Patient is a 83 year old female with history of type 2 diabetes, hyperlipidemia, GERD who presents after sustaining a mechanical fall in her garage earlier today. Patient fell backwards and hit the back of her head. Patient had no loss of consciousness. After the fall patient started having nausea, dizziness/vertigo and headache. Patient no prodromal symptoms leading up to the episode. Patient denies syncope, chest pain, palpitations.  On exam patient has large hematoma to the right parieto-occipital area. Bleeding is controlled. There are 2 small lacerations but due to the swelling this will not be able to be approximated at this time. This should heal fine with secondary intention. Patient has a nonfocal neurological exam.  CT head and cervical spine obtained significant for a nondisplaced and  non-depressed right parietal toe occipital skull fracture. No head bleed or cervical fracture. Patient has cervical tenderness however this is lateral on the left. Patient no midline tenderness. Next  Patient was being prepped for discharge when she starting having increasing shortness of breath. Patient was noted to have O2 sat of 84% and was placed on supple oxygen. Patient had equal bilateral breath sounds. Chest x-ray obtained which shows signs of pulmonary edema bilaterally. Patient was suppose to be worked up outpatient in February for CHF due to worsening dyspnea on exertion and episodes of shortness of breath. We will add-on BNP as well and plan for admission for inpatient echo and diuresis. Pt also has a  concussion from the head injury with associated nausea and vertigo. These symptoms improved after zofran and meclizine. Norco was given for severe head pain. Will order first dose of IV lasix. Giving Kcl PO for K of 3.0.  Pt seen with attending Dr. Ashok Cordia.     Final Clinical Impressions(s) / ED Diagnoses   Final diagnoses:  Concussion without loss of consciousness, initial encounter  Closed fracture of parietal bone, initial encounter ([place])  Fall, initial encounter  Hypoxia  Acute on chronic congestive heart failure, unspecified congestive heart failure type ([place])       Tobie Poet, DO 10/08/16 [place], MD 10/08/16 [phone]

## 2016-10-08 NOTE — ED Notes (Signed)
Dr. Nyoka Lint ordered to trial pt on rm air. O2 sat 90-91% on RA. O2 turned back on at 1L. Will continue to monitor.

## 2016-10-08 NOTE — ED Notes (Signed)
Assisted patient to restroom across the hall. Patient c/o dizziness and nausea. Gait slow, steady with 1 person assist.

## 2016-10-08 NOTE — ED Notes (Signed)
Hospitalist at bedside at this time 

## 2016-10-08 NOTE — ED Notes (Signed)
45 minute deadline. Hospitalist still at bedside.

## 2016-10-08 NOTE — ED Notes (Signed)
Attempted to call report x 1  

## 2016-10-08 NOTE — H&P (Signed)
History and Physical    Bianca Matthews UYQ:[id number] DOB: 05-11-1928 DOA: 10/08/2016  PCP: Alysia Penna, MD Consultants:  [name] - allergist; podiatrist Patient coming from: home - lives alone; NOK: son  Chief Complaint: fall  HPI: Bianca Matthews is a 83 y.o. female with medical history significant of HTN, HLD, and GERD presenting after a fall.  Patient is unsure what happened.  She went into the garage in her one story townhouse.  Stepped down, turned around, and the next thing she knew she was on the floor.  Not dizzy, no prewarning.  No LOC.  Hit her head hard on the concrete, lots of dizziness, + concussion, nausea following the fall.    The Er was preparing for the patient to be discharged when she was noted to be in mild respiratory distress with hypoxia.  She reports that she has been having episodes of SOB.  She was diagnosed with bronchitis on 10/12 and prescribed z-pack and cough medicine.  Got better but still has little episodes of breathlessness.   [facility], but unsure why these episodes happen.     ED Course:  Per Dr. Nyoka Lint: Patient is a 83 year old female with history of type 2 diabetes, hyperlipidemia, GERD who presents after sustaining a mechanical fall in her garage earlier today. Patient fell backwards and hit the back of her head. Patient had no loss of consciousness. After the fall patient started having nausea, dizziness/vertigo and headache. Patient no prodromal symptoms leading up to the episode. Patient denies syncope, chest pain, palpitations.  On exam patient has large hematoma to the right parieto-occipital area. Bleeding is controlled. There are 2 small lacerations but due to the swelling this will not be able to be approximated at this time. This should heal fine with secondary intention. Patient has a nonfocal neurological exam.  CT head and cervical spine obtained significant for a nondisplaced and non-depressed right parietal toe  occipital skull fracture. No head bleed or cervical fracture. Patient has cervical tenderness however this is lateral on the left. Patient no midline tenderness. Next  Patient was being prepped for discharge when she starting having increasing shortness of breath. Patient was noted to have O2 sat of 84% and was placed on supple oxygen. Patient had equal bilateral breath sounds. Chest x-ray obtained which shows signs of pulmonary edema bilaterally. Patient was suppose to be worked up outpatient in February for CHF due to worsening dyspnea on exertion and episodes of shortness of breath. We will add-on BNP as well and plan for admission for inpatient echo and diuresis. Pt also has a concussion from the head injury with associated nausea and vertigo. These symptoms improved after zofran and meclizine. Norco was given for severe head pain. Will order first dose of IV lasix. Giving Kcl PO for K of 3.0.  Pt seen with attending Dr. Ashok Cordia.   Review of Systems: As per HPI; otherwise 10 point review of systems reviewed and negative.   Ambulatory Status:  Ambulates without assistance  Past Medical History:  Diagnosis Date  . Allergy    sees Dr. [name]  . GERD (gastroesophageal reflux disease)   . Hyperlipidemia   . Hypertension   . Varicose veins     Past Surgical History:  Procedure Laterality Date  . APPENDECTOMY    . TONSILLECTOMY      Social History   Social History  . Marital status: Married    Spouse name: N/A  . Number of  children: N/A  . Years of education: N/A   Occupational History  . retired    Social History Main Topics  . Smoking status: Former Smoker    Packs/day: 0.50    Years: 10.00  . Smokeless tobacco: Never Used  . Alcohol use 0.0 oz/week     Comment: occ  . Drug use: No  . Sexual activity: Not on file   Other Topics Concern  . Not on file   Social History Narrative   Retired   Married          No Known Allergies  Family History  Problem Relation  Age of Onset  . Hypertension    . Heart disease      Prior to Admission medications   Medication Sig Start Date End Date Taking? Authorizing Provider  atenolol (TENORMIN) 100 MG tablet Take 100 mg by mouth 2 (two) times daily.   Yes Historical Provider, MD  furosemide (LASIX) 40 MG tablet Take 1 tablet (40 mg total) by mouth 3 (three) times daily. Patient taking differently: Take 40-80 mg by mouth See admin instructions. Take 2 tablets (80 mg) by mouth every morning and 1 tablet (40 mg) at night 05/19/16  Yes Laurey Morale, MD  losartan-hydrochlorothiazide (HYZAAR) 100-12.5 MG tablet Take 1 tablet by mouth daily. 05/19/16  Yes Laurey Morale, MD  potassium chloride SA (K-DUR,KLOR-CON) 20 MEQ tablet TAKE 1 TABLET EVERY DAY 12/21/15  Yes Laurey Morale, MD  aspirin 325 MG tablet Take 325 mg by mouth daily.    Historical Provider, MD  gabapentin (NEURONTIN) 100 MG capsule Take 1 capsule (100 mg total) by mouth 2 (two) times daily. Patient not taking: Reported on 07/13/2016 12/02/15   Laurey Morale, MD  HYDROcodone-acetaminophen [facility]) 5-325 MG tablet Take 1 tablet by mouth every 6 (six) hours as needed for severe pain. Take tylenol for mild to moderate pain. Take Norco for severe pain. 10/08/16   Tobie Poet, DO  meclizine (ANTIVERT) 12.5 MG tablet Take 1 tablet (12.5 mg total) by mouth 2 (two) times daily as needed for dizziness. 10/08/16 10/15/16  Tobie Poet, DO  metoprolol succinate (TOPROL-XL) 100 MG 24 hr tablet Take 1 tablet (100 mg total) by mouth daily. Take with or immediately following a meal. 05/19/16   Laurey Morale, MD  Multiple Vitamins-Minerals (ONE-A-DAY 50 PLUS PO) Take 1 each by mouth daily.      Historical Provider, MD  omeprazole (PRILOSEC) 40 MG capsule TAKE 1 CAPSULE EVERY DAY 12/21/15   Laurey Morale, MD  ondansetron (ZOFRAN ODT) 4 MG disintegrating tablet Take 1 tablet (4 mg total) by mouth every 8 (eight) hours as needed for nausea or vomiting. 10/08/16 10/13/16  Tobie Poet, DO  Probiotic  Product (PROBIOTIC FORMULA PO) Take 1 each by mouth daily.      Historical Provider, MD  rosuvastatin (CRESTOR) 10 MG tablet Take 1 tablet (10 mg total) by mouth at bedtime. 05/19/16   Laurey Morale, MD    Physical Exam: Vitals:   10/08/16 1830 10/08/16 1900 10/08/16 1915 10/08/16 2039  BP: 147/56 151/68  139/80  Pulse: 66 65 71 (!) 54  Resp: '15 18  18  '$ Temp:    97.5 F (36.4 C)  TempSrc:    Oral  SpO2: 94% 93% 93% 96%  Weight:    85.3 kg (188 lb)  Height:    '5\' 4"'$  (1.626 m)     General: Appears uncomfortable, disgruntled Eyes:  PERRL, EOMI, normal  lids, iris ENT:  grossly normal hearing, lips & tongue, mmm Neck:  no LAD, masses or thyromegaly Cardiovascular:  RRR, no m/r/g. 2+ LE edema, TED hose in place.  Respiratory:  CTA bilaterally, no w/r/r. Normal respiratory effort. Abdomen:  soft, ntnd, NABS Skin:  Small hematoma with coagulated blood surrounding on posterior occiput Musculoskeletal:  grossly normal tone BUE/BLE, good ROM, no bony abnormality Psychiatric: somewhat cantankerous, speech fluent and appropriate, AOx3 Neurologic:  CN 2-12 grossly intact, moves all extremities in coordinated fashion, sensation intact; when she moved her head to allow me to see the occiput, she became profoundly dizzy and somewhat nauseated  Labs on Admission: I have personally reviewed following labs and imaging studies  CBC:  Recent Labs Lab 10/08/16 1529  WBC 9.8  HGB 12.5  HCT 38.5  MCV 92.8  PLT 073   Basic Metabolic Panel:  Recent Labs Lab 10/08/16 1529  NA 134*  K 3.0*  CL 96*  CO2 29  GLUCOSE 180*  BUN 26*  CREATININE 0.79  CALCIUM 9.3   GFR: Estimated Creatinine Clearance: 53.3 mL/min (by C-G formula based on SCr of 0.79 mg/dL). Liver Function Tests: No results for input(s): AST, ALT, ALKPHOS, BILITOT, PROT, ALBUMIN in the last 168 hours. No results for input(s): LIPASE, AMYLASE in the last 168 hours. No results for input(s): AMMONIA in the last 168  hours. Coagulation Profile: No results for input(s): INR, PROTIME in the last 168 hours. Cardiac Enzymes: No results for input(s): CKTOTAL, CKMB, CKMBINDEX, TROPONINI in the last 168 hours. BNP (last 3 results) No results for input(s): PROBNP in the last 8760 hours. HbA1C: No results for input(s): HGBA1C in the last 72 hours. CBG: No results for input(s): GLUCAP in the last 168 hours. Lipid Profile: No results for input(s): CHOL, HDL, LDLCALC, TRIG, CHOLHDL, LDLDIRECT in the last 72 hours. Thyroid Function Tests: No results for input(s): TSH, T4TOTAL, FREET4, T3FREE, THYROIDAB in the last 72 hours. Anemia Panel: No results for input(s): VITAMINB12, FOLATE, FERRITIN, TIBC, IRON, RETICCTPCT in the last 72 hours. Urine analysis:    Component Value Date/Time   BILIRUBINUR n 02/24/2015 0937   PROTEINUR n 02/24/2015 0937   UROBILINOGEN 0.2 02/24/2015 0937   NITRITE n 02/24/2015 0937   LEUKOCYTESUR Trace 02/24/2015 0937    Creatinine Clearance: Estimated Creatinine Clearance: 53.3 mL/min (by C-G formula based on SCr of 0.79 mg/dL).  Sepsis Labs: '@LABRCNTIP'$ (procalcitonin:4,lacticidven:4) )No results found for this or any previous visit (from the past 240 hour(s)).   Radiological Exams on Admission: Ct Head Wo Contrast  Result Date: 10/08/2016 CLINICAL DATA:  FALL THIS AM WITHOUT LOC PER PATIENT PATIENT FELL AND STRUCK OCCIPITAL PORTION OF HEAD HEADACHE AND DIZZINESS SINCE FALL AND NECK PAIN PMH: HTN, Allergy; GERD ; Hyperlipidemia; Varicose veins EXAM: CT HEAD WITHOUT CONTRAST CT CERVICAL SPINE WITHOUT CONTRAST TECHNIQUE: Multidetector CT imaging of the head and cervical spine was performed following the standard protocol without intravenous contrast. Multiplanar CT image reconstructions of the cervical spine were also generated. COMPARISON:  None. FINDINGS: CT HEAD FINDINGS Brain: No evidence of acute infarction, hemorrhage, hydrocephalus, extra-axial collection or mass lesion/mass  effect. The ventricles and sulci are enlarged reflecting age appropriate volume loss. Minor periventricular white matter hypoattenuation is noted consistent with chronic microvascular ischemic change. Vascular: No hyperdense vessel or unexpected calcification. Skull: There is a nondisplaced, nondepressed fracture of the right posterior parietal and occipital bone lying adjacent to the lambdoid suture. Sinuses/Orbits: Globes orbits are unremarkable. Sinuses and mastoid air cells are clear.  Other: Right posterior parietal scalp hematoma. CT CERVICAL SPINE FINDINGS Alignment: Normal. Skull base and vertebrae: No acute fracture. No primary bone lesion or focal pathologic process. Soft tissues and spinal canal: No prevertebral fluid or swelling. No visible canal hematoma. Disc levels: The disc spaces are well maintained. No disc herniations or significant disc bulging. Facet degenerative changes noted on the left at C2-C3. Spinal canal and neural foramina are widely patent. Upper chest: Unremarkable. Other: None IMPRESSION: HEAD CT 1. No acute intracranial abnormality.  No intracranial hemorrhage. 2. Nondisplaced, nondepressed right posterior parietal/occipital skull fracture. This lies underneath the right posterior parietal scalp hematoma. CERVICAL CT 1. No fracture or acute finding. Electronically Signed   By: Lajean Manes M.D.   On: 10/08/2016 16:14   Ct Cervical Spine Wo Contrast  Result Date: 10/08/2016 CLINICAL DATA:  FALL THIS AM WITHOUT LOC PER PATIENT PATIENT FELL AND STRUCK OCCIPITAL PORTION OF HEAD HEADACHE AND DIZZINESS SINCE FALL AND NECK PAIN PMH: HTN, Allergy; GERD ; Hyperlipidemia; Varicose veins EXAM: CT HEAD WITHOUT CONTRAST CT CERVICAL SPINE WITHOUT CONTRAST TECHNIQUE: Multidetector CT imaging of the head and cervical spine was performed following the standard protocol without intravenous contrast. Multiplanar CT image reconstructions of the cervical spine were also generated. COMPARISON:  None.  FINDINGS: CT HEAD FINDINGS Brain: No evidence of acute infarction, hemorrhage, hydrocephalus, extra-axial collection or mass lesion/mass effect. The ventricles and sulci are enlarged reflecting age appropriate volume loss. Minor periventricular white matter hypoattenuation is noted consistent with chronic microvascular ischemic change. Vascular: No hyperdense vessel or unexpected calcification. Skull: There is a nondisplaced, nondepressed fracture of the right posterior parietal and occipital bone lying adjacent to the lambdoid suture. Sinuses/Orbits: Globes orbits are unremarkable. Sinuses and mastoid air cells are clear. Other: Right posterior parietal scalp hematoma. CT CERVICAL SPINE FINDINGS Alignment: Normal. Skull base and vertebrae: No acute fracture. No primary bone lesion or focal pathologic process. Soft tissues and spinal canal: No prevertebral fluid or swelling. No visible canal hematoma. Disc levels: The disc spaces are well maintained. No disc herniations or significant disc bulging. Facet degenerative changes noted on the left at C2-C3. Spinal canal and neural foramina are widely patent. Upper chest: Unremarkable. Other: None IMPRESSION: HEAD CT 1. No acute intracranial abnormality.  No intracranial hemorrhage. 2. Nondisplaced, nondepressed right posterior parietal/occipital skull fracture. This lies underneath the right posterior parietal scalp hematoma. CERVICAL CT 1. No fracture or acute finding. Electronically Signed   By: Lajean Manes M.D.   On: 10/08/2016 16:14   Dg Chest Portable 1 View  Result Date: 10/08/2016 CLINICAL DATA:  From home via EMS: reports tripped over step in garage and fell. Pt had a destat episode, R/o pneumothorax EXAM: PORTABLE CHEST 1 VIEW COMPARISON:  None. FINDINGS: Cardiac silhouette is mildly enlarged. No convincing mediastinal or hilar masses. Opacity extends from the region of the right hilum obliquely to the right lateral lung base obscuring hemidiaphragm. This  is likely atelectasis. Consider pneumonia if there are consistent clinical symptoms. There are mildly prominent bronchovascular markings bilaterally. No convincing pulmonary edema. No pneumothorax. Skeletal structures are demineralized but grossly intact. IMPRESSION: 1. No pneumothorax. 2. Right lung base opacity which may reflect atelectasis. Pneumonia is possible. 3. No pulmonary edema.  Mild cardiomegaly. Electronically Signed   By: Lajean Manes M.D.   On: 10/08/2016 17:57    EKG: Independently reviewed.  NSR with rate 56; PAC with no evidence of acute ischemia  Assessment/Plan Principal Problem:   Skull fracture with concussion ([place]) Active  Problems:   Essential hypertension   Hyperglycemia   Shortness of breath   Hypokalemia   Skull fracture with concussion -Nondepressed nondisplaced skull fracture in the right parietal/occipital region s/p mechanical fall -It is possible that the patient had syncope that led to the fall, although she does not think she had LOC and does not remember feeling pre-syncopal -The ER was prepared to discharge the patient if not for her acute respiratory distress episode, but on my exam she remained profoundly dizzy with head movement -She lives alone and this is a safety concern -She likely needs PT/OT assessment -For now, will admit while undergoing further evaluation -Neuro checks q2h -Meclizine prn dizziness -Zofran prn n/v  SOB -Patient with apparent recurrent episodes of SOB -Had one such episode while in the ER with associated hypoxia -I was called to admit for CHF, but this is not a certain diagnosis for her -Her BNP is mildly elevated, but there is no known baseline available -CXR appears to show atelectasis, possible infiltrate, and no pulmonary edema -The patient already received Lasix in the ER, 40 mg x 1 -Will not give additional IV Lasix at this time (although she does take 40-80 mg BID at home for LE edema) -Hold home Lasix for now, as  well -Will also not treat empirically for CAP, as she does not have apparent symptoms at this time -Instead, will provide IS and monitor for ongoing issues -Will check Echocardiogram both for SOB and for possible syncope, as above  Hypokalemia -K 3.0 -She was given 40 mEq PO x 1 in ER -Will give an additional 20 mEq PO x 1 now and recheck in AM  Hypertension -Continue Tenormin (changing to metoprolol once she runs out of her current supply) -Hold Hyzaar due to hypokalemia and Lasix dosing (concern for renal stress)  Hyperglycemia -Glucose 180 -While she does not currently have a listed h/o DM, she has a listed PMH diagnosis of diabetic neuropathy -Given her age, her current glucose is likely reasonable -Will not do further testing at this time and suggest deferral to outpatient physician unless she becomes markedly hyperglycemic - the risk of hypoglycemia is more concerning in this patient than hyperglycemia  DVT prophylaxis: SCDs - while initial tendency was to give Lovenox, given her recent head trauma will instead use SCDs for now Code Status: DNR - confirmed with patient Family Communication: None present Disposition Plan:  Home once clinically improved Consults called: PT/OT, case manager  Admission status: Admit - It is my clinical opinion that admission to INPATIENT is reasonable and necessary because this patient will require at least 2 midnights in the hospital to treat this condition based on the medical complexity of the problems presented.  Given the aforementioned information, the predictability of an adverse outcome is felt to be significant.    Karmen Bongo MD [facility]  If 7PM-7AM, please contact night-coverage [email or web address] Password [email or web address]  10/08/2016, 11:07 PM

## 2016-10-08 NOTE — ED Notes (Signed)
Patient transported to CT 

## 2016-10-08 NOTE — Discharge Instructions (Signed)
Please take half tablet of your normal dose of aspirin over the next 3 days, then you may return to her regular dose of aspirin. For pain and headaches please take on all for mild to moderate pain. He may take the Norco if the pain is not controlled by Tylenol. If her headache suddenly worsens or he started having new neurological symptoms please return to the emergency department for reevaluation. Please follow-up this coming Friday with your primary care doctor for concussion follow-up. Zofran is prescribed for nausea as needed and meclizine as prescribed for vertigo/dizziness symptoms as needed.

## 2016-10-08 NOTE — ED Triage Notes (Addendum)
From home via EMS: reports tripped over step in garage and fell. Hit back of head, laceration present with bleeding controlled and bandaged by first responders. Reports feeling dizzy, nauseated, and headache post fall. Denies symptoms prior to fall. Takes Asprin, no Rx blood thinners.

## 2016-10-09 ENCOUNTER — Other Ambulatory Visit (HOSPITAL_COMMUNITY): Payer: Medicare PPO

## 2016-10-09 DIAGNOSIS — R0602 Shortness of breath: Secondary | ICD-10-CM

## 2016-10-09 DIAGNOSIS — S0291XA Unspecified fracture of skull, initial encounter for closed fracture: Secondary | ICD-10-CM

## 2016-10-09 DIAGNOSIS — E876 Hypokalemia: Secondary | ICD-10-CM

## 2016-10-09 DIAGNOSIS — R739 Hyperglycemia, unspecified: Secondary | ICD-10-CM

## 2016-10-09 DIAGNOSIS — I10 Essential (primary) hypertension: Secondary | ICD-10-CM

## 2016-10-09 DIAGNOSIS — S060X9A Concussion with loss of consciousness of unspecified duration, initial encounter: Secondary | ICD-10-CM

## 2016-10-09 LAB — CBC WITH DIFFERENTIAL/PLATELET
Basophils Absolute: 0 10*3/uL (ref 0.0–0.1)
Basophils Relative: 0 %
Eosinophils Absolute: 0 10*3/uL (ref 0.0–0.7)
Eosinophils Relative: 0 %
HCT: 38.5 % (ref 36.0–46.0)
Hemoglobin: 12.4 g/dL (ref 12.0–15.0)
Lymphocytes Relative: 12 %
Lymphs Abs: 1.2 10*3/uL (ref 0.7–4.0)
MCH: 30.1 pg (ref 26.0–34.0)
MCHC: 32.2 g/dL (ref 30.0–36.0)
MCV: 93.4 fL (ref 78.0–100.0)
Monocytes Absolute: 0.4 10*3/uL (ref 0.1–1.0)
Monocytes Relative: 5 %
Neutro Abs: 7.9 10*3/uL — ABNORMAL HIGH (ref 1.7–7.7)
Neutrophils Relative %: 83 %
Platelets: 213 10*3/uL (ref 150–400)
RBC: 4.12 MIL/uL (ref 3.87–5.11)
RDW: 14.7 % (ref 11.5–15.5)
WBC: 9.6 10*3/uL (ref 4.0–10.5)

## 2016-10-09 LAB — BASIC METABOLIC PANEL
Anion gap: 7 (ref 5–15)
BUN: 22 mg/dL — ABNORMAL HIGH (ref 6–20)
CO2: 31 mmol/L (ref 22–32)
Calcium: 9.1 mg/dL (ref 8.9–10.3)
Chloride: 98 mmol/L — ABNORMAL LOW (ref 101–111)
Creatinine, Ser: 0.84 mg/dL (ref 0.44–1.00)
GFR calc Af Amer: >60 mL/min (ref >60)
GFR calc non Af Amer: >60 mL/min (ref >60)
Glucose, Bld: 122 mg/dL — ABNORMAL HIGH (ref 65–99)
Potassium: 4.5 mmol/L (ref 3.5–5.1)
Sodium: 136 mmol/L (ref 135–145)

## 2016-10-09 MED ORDER — GUAIFENESIN ER 600 MG PO TB12
1200.0000 mg | ORAL_TABLET | Freq: Two times a day (BID) | ORAL | Status: DC
  Administered 2016-10-09 – 2016-10-11 (×5): 1200 mg via ORAL
  Filled 2016-10-09 (×5): qty 2

## 2016-10-09 MED ORDER — GUAIFENESIN-DM 100-10 MG/5ML PO SYRP
5.0000 mL | ORAL_SOLUTION | ORAL | Status: DC | PRN
Start: 2016-10-09 — End: 2016-10-11

## 2016-10-09 MED ORDER — MECLIZINE HCL 25 MG PO TABS
12.5000 mg | ORAL_TABLET | Freq: Three times a day (TID) | ORAL | Status: DC
  Administered 2016-10-09 – 2016-10-11 (×7): 12.5 mg via ORAL
  Filled 2016-10-09 (×6): qty 1

## 2016-10-09 MED ORDER — LEVOFLOXACIN 750 MG PO TABS
750.0000 mg | ORAL_TABLET | Freq: Every day | ORAL | Status: DC
  Administered 2016-10-09: 750 mg via ORAL
  Filled 2016-10-09: qty 1

## 2016-10-09 MED ORDER — MECLIZINE HCL 25 MG PO TABS
25.0000 mg | ORAL_TABLET | Freq: Three times a day (TID) | ORAL | Status: DC
  Filled 2016-10-09: qty 1

## 2016-10-09 NOTE — Progress Notes (Signed)
[facility]                                                                              Patient Demographics  Bianca Matthews, is a 83 y.o. female, DOB - 1928/09/22, OEV:[id number]  Admit date - 10/08/2016   Admitting Physician Karmen Bongo, MD  Outpatient Primary MD for the patient is Alysia Penna, MD  Outpatient specialists:   LOS - 1  days    Chief Complaint  Patient presents with  . Fall  . Laceration       Brief summary   Patient is a 83 year old female with hypertension, hyperlipidemia, GERD presented after a mechanical fall. She hit her head hard on the concrete with subsequent dizziness, nausea and concussion after the fall. In the ER, patient was noted to be in mild respiratory distress with hypoxia and she reported having episodes of shortness of breath, dyspnea on exertion. She was diagnosed with bronchitis on 10/12 and finished a course of a Zithromax.  CT head and cervical spine was significant for a nondisplaced and non-depressed right parietal toe occipital skull fracture. No head bleed or cervical fracture.  Patient was noted to have O2 sats of 84% in ED and was placed on supplemental O2.  Patient was admitted for fall/head injury, concussion and acute hypoxic respiratory failure.   Assessment & Plan    Principal Problem:   Skull fracture with concussion (HCC)With dizziness - CT head and cervical spine was significant for a nondisplaced and non-depressed right parietal toe occipital skull fracture. No head bleed or cervical fracture.  - Patient still has significant dizziness on getting up, possibly had syncopal episode that led to the fall however patient cannot recall the events. Lives alone - PTOT consult - Placed on meclizine scheduled today due to significant dizziness, Zofran as needed  Active Problems: Acute hypoxic respiratory failure:  - BNP was elevated at 296 with chest x-ray showing right lung opacity may reflect atelectasis or  possibility of pneumonia. Cardiomegaly but no convincing pulmonary edema. - Patient received Lasix in ED, will hold off on further Lasix until 2-D echo results - Patient reporting significant cough and productive phlegm, will send sputum culture, placed on Levaquin, Mucinex, Robitussin   Mechanical fall - PTOT consult  Hypokalemia - Resolved with supplementation    Essential hypertension - Currently stable, continue Tenormin (changing to metoprolol once she runs out of her current supply) -Hold Hyzaar due to hypokalemia and Lasix dosing (concern for renal stress)    Hyperglycemia - No listed history of diabetes mellitus, will check hemoglobin A1c  Code Status: DNR  DVT Prophylaxis:   SCD's Family Communication: Discussed in detail with the patient, all imaging results, lab results explained to the patient and son at bed side   Disposition Plan: likely in am   Time Spent in minutes   25 minutes  Procedures:   Consultants:     Antimicrobials:   levaquin 1/8   Medications  Scheduled Meds: . aspirin  325 mg Oral QPM  . atenolol  100 mg Oral BID  . guaiFENesin  1,200 mg Oral BID  . losartan  100 mg  Oral Daily   And  . hydrochlorothiazide  12.5 mg Oral Daily  . levofloxacin  750 mg Oral Daily  . meclizine  12.5 mg Oral TID  . pantoprazole  40 mg Oral Daily  . rosuvastatin  10 mg Oral QPM  . sodium chloride flush  3 mL Intravenous Q12H   Continuous Infusions: PRN Meds:.sodium chloride, acetaminophen, guaiFENesin-dextromethorphan, ondansetron (ZOFRAN) IV, sodium chloride flush   Antibiotics   Anti-infectives    Start     Dose/Rate Route Frequency Ordered Stop   10/09/16 1000  levofloxacin (LEVAQUIN) tablet 750 mg     750 mg Oral Daily 10/09/16 0932          Subjective:   Bianca Matthews was seen and examined today. C/o dizziness upon walking upto the bathroom or standing. Also coughing with productive phlegm.  Patient chest pain, abdominal pain, N/V/D/C,  new weakness, numbess, tingling. No acute events overnight.  + Intermittent episodes of dyspnea on exertion for last several weeks  Objective:   Vitals:   10/09/16 1000 10/09/16 1005 10/09/16 1008 10/09/16 1013  BP: 132/61     Pulse:      Resp: 16     Temp:      TempSrc:      SpO2: (!) 86% (!) 83% (!) 88% 96%  Weight:      Height:        Intake/Output Summary (Last 24 hours) at 10/09/16 1054 Last data filed at 10/09/16 0942  Gross per 24 hour  Intake              340 ml  Output              600 ml  Net             -260 ml     Wt Readings from Last 3 Encounters:  10/09/16 85 kg (187 lb 4.8 oz)  07/27/16 84.4 kg (186 lb)  07/13/16 84.4 kg (186 lb)     Exam  General: Alert and oriented x 3, NAD  HEENT:    Neck: Supple, no JVD  Cardiovascular: S1 S2 auscultated, no rubs, murmurs or gallops. Regular rate and rhythm.  Respiratory: Clear to auscultation bilaterally, no wheezing, rales or rhonchi  Gastrointestinal: Soft, nontender, nondistended, + bowel sounds  Ext: no cyanosis clubbing or edema  Neuro: AAOx3, Cr N's II- XII. Strength 5/5 upper and lower extremities bilaterally  Skin: No rashes  Psych: Normal affect and demeanor, alert and oriented x3    Data Reviewed:  I have personally reviewed following labs and imaging studies  Micro Results No results found for this or any previous visit (from the past 240 hour(s)).  Radiology Reports Ct Head Wo Contrast  Result Date: 10/08/2016 CLINICAL DATA:  FALL THIS AM WITHOUT LOC PER PATIENT PATIENT FELL AND STRUCK OCCIPITAL PORTION OF HEAD HEADACHE AND DIZZINESS SINCE FALL AND NECK PAIN PMH: HTN, Allergy; GERD ; Hyperlipidemia; Varicose veins EXAM: CT HEAD WITHOUT CONTRAST CT CERVICAL SPINE WITHOUT CONTRAST TECHNIQUE: Multidetector CT imaging of the head and cervical spine was performed following the standard protocol without intravenous contrast. Multiplanar CT image reconstructions of the cervical spine were also  generated. COMPARISON:  None. FINDINGS: CT HEAD FINDINGS Brain: No evidence of acute infarction, hemorrhage, hydrocephalus, extra-axial collection or mass lesion/mass effect. The ventricles and sulci are enlarged reflecting age appropriate volume loss. Minor periventricular white matter hypoattenuation is noted consistent with chronic microvascular ischemic change. Vascular: No hyperdense vessel or unexpected calcification. Skull: There  is a nondisplaced, nondepressed fracture of the right posterior parietal and occipital bone lying adjacent to the lambdoid suture. Sinuses/Orbits: Globes orbits are unremarkable. Sinuses and mastoid air cells are clear. Other: Right posterior parietal scalp hematoma. CT CERVICAL SPINE FINDINGS Alignment: Normal. Skull base and vertebrae: No acute fracture. No primary bone lesion or focal pathologic process. Soft tissues and spinal canal: No prevertebral fluid or swelling. No visible canal hematoma. Disc levels: The disc spaces are well maintained. No disc herniations or significant disc bulging. Facet degenerative changes noted on the left at C2-C3. Spinal canal and neural foramina are widely patent. Upper chest: Unremarkable. Other: None IMPRESSION: HEAD CT 1. No acute intracranial abnormality.  No intracranial hemorrhage. 2. Nondisplaced, nondepressed right posterior parietal/occipital skull fracture. This lies underneath the right posterior parietal scalp hematoma. CERVICAL CT 1. No fracture or acute finding. Electronically Signed   By: Lajean Manes M.D.   On: 10/08/2016 16:14   Ct Cervical Spine Wo Contrast  Result Date: 10/08/2016 CLINICAL DATA:  FALL THIS AM WITHOUT LOC PER PATIENT PATIENT FELL AND STRUCK OCCIPITAL PORTION OF HEAD HEADACHE AND DIZZINESS SINCE FALL AND NECK PAIN PMH: HTN, Allergy; GERD ; Hyperlipidemia; Varicose veins EXAM: CT HEAD WITHOUT CONTRAST CT CERVICAL SPINE WITHOUT CONTRAST TECHNIQUE: Multidetector CT imaging of the head and cervical spine was  performed following the standard protocol without intravenous contrast. Multiplanar CT image reconstructions of the cervical spine were also generated. COMPARISON:  None. FINDINGS: CT HEAD FINDINGS Brain: No evidence of acute infarction, hemorrhage, hydrocephalus, extra-axial collection or mass lesion/mass effect. The ventricles and sulci are enlarged reflecting age appropriate volume loss. Minor periventricular white matter hypoattenuation is noted consistent with chronic microvascular ischemic change. Vascular: No hyperdense vessel or unexpected calcification. Skull: There is a nondisplaced, nondepressed fracture of the right posterior parietal and occipital bone lying adjacent to the lambdoid suture. Sinuses/Orbits: Globes orbits are unremarkable. Sinuses and mastoid air cells are clear. Other: Right posterior parietal scalp hematoma. CT CERVICAL SPINE FINDINGS Alignment: Normal. Skull base and vertebrae: No acute fracture. No primary bone lesion or focal pathologic process. Soft tissues and spinal canal: No prevertebral fluid or swelling. No visible canal hematoma. Disc levels: The disc spaces are well maintained. No disc herniations or significant disc bulging. Facet degenerative changes noted on the left at C2-C3. Spinal canal and neural foramina are widely patent. Upper chest: Unremarkable. Other: None IMPRESSION: HEAD CT 1. No acute intracranial abnormality.  No intracranial hemorrhage. 2. Nondisplaced, nondepressed right posterior parietal/occipital skull fracture. This lies underneath the right posterior parietal scalp hematoma. CERVICAL CT 1. No fracture or acute finding. Electronically Signed   By: Lajean Manes M.D.   On: 10/08/2016 16:14   Dg Chest Portable 1 View  Result Date: 10/08/2016 CLINICAL DATA:  From home via EMS: reports tripped over step in garage and fell. Pt had a destat episode, R/o pneumothorax EXAM: PORTABLE CHEST 1 VIEW COMPARISON:  None. FINDINGS: Cardiac silhouette is mildly  enlarged. No convincing mediastinal or hilar masses. Opacity extends from the region of the right hilum obliquely to the right lateral lung base obscuring hemidiaphragm. This is likely atelectasis. Consider pneumonia if there are consistent clinical symptoms. There are mildly prominent bronchovascular markings bilaterally. No convincing pulmonary edema. No pneumothorax. Skeletal structures are demineralized but grossly intact. IMPRESSION: 1. No pneumothorax. 2. Right lung base opacity which may reflect atelectasis. Pneumonia is possible. 3. No pulmonary edema.  Mild cardiomegaly. Electronically Signed   By: Lajean Manes M.D.   On: 10/08/2016  17:57    Lab Data:  CBC:  Recent Labs Lab 10/08/16 1529 10/09/16 0248  WBC 9.8 9.6  NEUTROABS  --  7.9*  HGB 12.5 12.4  HCT 38.5 38.5  MCV 92.8 93.4  PLT 210 195   Basic Metabolic Panel:  Recent Labs Lab 10/08/16 1529 10/09/16 0248  NA 134* 136  K 3.0* 4.5  CL 96* 98*  CO2 29 31  GLUCOSE 180* 122*  BUN 26* 22*  CREATININE 0.79 0.84  CALCIUM 9.3 9.1   GFR: Estimated Creatinine Clearance: 50.7 mL/min (by C-G formula based on SCr of 0.84 mg/dL). Liver Function Tests: No results for input(s): AST, ALT, ALKPHOS, BILITOT, PROT, ALBUMIN in the last 168 hours. No results for input(s): LIPASE, AMYLASE in the last 168 hours. No results for input(s): AMMONIA in the last 168 hours. Coagulation Profile: No results for input(s): INR, PROTIME in the last 168 hours. Cardiac Enzymes: No results for input(s): CKTOTAL, CKMB, CKMBINDEX, TROPONINI in the last 168 hours. BNP (last 3 results) No results for input(s): PROBNP in the last 8760 hours. HbA1C: No results for input(s): HGBA1C in the last 72 hours. CBG: No results for input(s): GLUCAP in the last 168 hours. Lipid Profile: No results for input(s): CHOL, HDL, LDLCALC, TRIG, CHOLHDL, LDLDIRECT in the last 72 hours. Thyroid Function Tests: No results for input(s): TSH, T4TOTAL, FREET4, T3FREE,  THYROIDAB in the last 72 hours. Anemia Panel: No results for input(s): VITAMINB12, FOLATE, FERRITIN, TIBC, IRON, RETICCTPCT in the last 72 hours. Urine analysis:    Component Value Date/Time   BILIRUBINUR n 02/24/2015 0937   PROTEINUR n 02/24/2015 0937   UROBILINOGEN 0.2 02/24/2015 0937   NITRITE n 02/24/2015 0937   LEUKOCYTESUR Trace 02/24/2015 0932     RAI,RIPUDEEP M.D. [facility] 10/09/2016, 10:54 AM  Pager: [phone] Between 7am to 7pm - call Pager - [phone]  After 7pm go to [email or web address] - password [email or web address]  Call night coverage person covering after 7pm

## 2016-10-09 NOTE — Evaluation (Signed)
Physical Therapy Evaluation Patient Details Name: Bianca Matthews MRN: [id number] DOB: [birth year] Today's Date: 10/09/2016   History of Present Illness  83 y.o.femalepresenting to ED following a fall. Patient is unsure what happened. She went into the garage in her one story townhouse, turned around, and the next thing she knew she was on the floor. Not dizzy, no prewarning. No LOC. Hit her head hard on the concrete, lots of dizziness, + concussion, nausea following the fall. Pt with resulting skull fx and concussion. Patient was being prepped for discharge when she starting having increasing shortness of breath. Patient was noted to have O2 sat of 84% and was placed on supple oxygen. PMH: HTN.  Clinical Impression  Pt admitted with above diagnosis. Pt currently with functional limitations due to the deficits listed below (see PT Problem List). Pt able to ambulate 75 ft with rw and 3L O2 with SpO2 94% following ambulation. Nursing reporting SpO2 83% sitting EOB prior to ambulation and O2 increased. Patient did have 2 losses of balance during session. The patient's son was present and supportive throughout the session. It was reported that family is planning to stay with the pt at her home 24/7 following D/C.  Pt will benefit from skilled PT to increase their independence and safety with mobility to allow discharge to the venue listed below.       Follow Up Recommendations Home health PT;Supervision/Assistance - 24 hour    Equipment Recommendations  Rolling walker with 5" wheels    Recommendations for Other Services       Precautions / Restrictions Precautions Precautions: Fall Restrictions Weight Bearing Restrictions: No      Mobility  Bed Mobility               General bed mobility comments: pt sitting EOB upon arrival  Transfers Overall transfer level: Needs assistance Equipment used: Rolling walker (2 wheeled) Transfers: Sit to/from Stand Sit to Stand: Min guard          General transfer comment: guard for safety, transfers performed from bed and toilet height.   Ambulation/Gait Ambulation/Gait assistance: Min guard Ambulation Distance (Feet): 75 Feet Assistive device: Rolling walker (2 wheeled) Gait Pattern/deviations: Step-through pattern Gait velocity: decreased   General Gait Details: cue for safe use of rw, pt with 2 losses of balance during ambulation, noted with turning. Pt reports having mild dizziness toward end of session.   Stairs            Wheelchair Mobility    Modified Rankin (Stroke Patients Only)       Balance Overall balance assessment: Needs assistance Sitting-balance support: No upper extremity supported Sitting balance-Leahy Scale: Good     Standing balance support: During functional activity Standing balance-Leahy Scale: Fair Standing balance comment: using rw for stability during ambulation                             Pertinent Vitals/Pain Pain Assessment: Faces Faces Pain Scale: Hurts little more Pain Location: head Pain Descriptors / Indicators: Grimacing Pain Intervention(s): Limited activity within patient's tolerance;Monitored during session    Home Living Family/patient expects to be discharged to:: Private residence Living Arrangements: Alone Available Help at Discharge: Family;Available 24 hours/day Type of Home: House Home Access: Level entry     Home Layout: One level Home Equipment: None Additional Comments: Son present and reports that they are arranging for family to stay with her 24/7 following D/C.  Prior Function Level of Independence: Independent         Comments: Pt was living alone and independently.      Hand Dominance        Extremity/Trunk Assessment   Upper Extremity Assessment Upper Extremity Assessment: Defer to OT evaluation    Lower Extremity Assessment Lower Extremity Assessment: Generalized weakness       Communication    Communication: HOH  Cognition Arousal/Alertness: Awake/alert Behavior During Therapy: WFL for tasks assessed/performed;Impulsive Overall Cognitive Status: Within Functional Limits for tasks assessed                 General Comments: Family reports that the pt is congnitively at her baseline. She was mildly impulsive during session, not following cues consistently for safety     General Comments General comments (skin integrity, edema, etc.): SpO2 94% during ambulation on 3L O2    Exercises     Assessment/Plan    PT Assessment Patient needs continued PT services  PT Problem List Decreased strength;Decreased activity tolerance;Decreased balance;Decreased mobility;Decreased safety awareness          PT Treatment Interventions DME instruction;Gait training;Stair training;Functional mobility training;Therapeutic activities;Therapeutic exercise;Balance training;Patient/family education    PT Goals (Current goals can be found in the Care Plan section)  Acute Rehab PT Goals Patient Stated Goal: get back home PT Goal Formulation: With patient/family Time For Goal Achievement: 10/23/16 Potential to Achieve Goals: Good    Frequency Min 3X/week   Barriers to discharge        Co-evaluation               End of Session Equipment Utilized During Treatment: Gait belt;Oxygen Activity Tolerance: Patient tolerated treatment well;Patient limited by fatigue (mild dizziness reported) Patient left: in chair;with call bell/phone within reach;with chair alarm set;with family/visitor present Nurse Communication: Mobility status         Time: 9688-6484 PT Time Calculation (min) (ACUTE ONLY): 37 min   Charges:   PT Evaluation $PT Eval Moderate Complexity: 1 Procedure PT Treatments $Gait Training: 8-22 mins   PT G Codes:        Cassell Clement, PT, CSCS Pager [phone] Office [phone]  10/09/2016, 11:04 AM

## 2016-10-09 NOTE — Progress Notes (Signed)
OT Cancellation Note  Patient Details Name: Atticus Lemberger MRN: [id number] DOB: [birth year]   Cancelled Treatment:    Reason Eval/Treat Not Completed: Patient at procedure or test/ unavailable  Peri Maris  [phone] 10/09/2016, 2:52 PM

## 2016-10-10 ENCOUNTER — Inpatient Hospital Stay (HOSPITAL_COMMUNITY): Payer: Medicare PPO

## 2016-10-10 DIAGNOSIS — I509 Heart failure, unspecified: Secondary | ICD-10-CM

## 2016-10-10 LAB — ECHOCARDIOGRAM COMPLETE
Height: 64 in
Weight: 3000 oz

## 2016-10-10 LAB — BASIC METABOLIC PANEL
Anion gap: 11 (ref 5–15)
BUN: 23 mg/dL — ABNORMAL HIGH (ref 6–20)
CO2: 27 mmol/L (ref 22–32)
Calcium: 9.2 mg/dL (ref 8.9–10.3)
Chloride: 99 mmol/L — ABNORMAL LOW (ref 101–111)
Creatinine, Ser: 0.97 mg/dL (ref 0.44–1.00)
GFR calc Af Amer: 60 mL/min — ABNORMAL LOW (ref >60)
GFR calc non Af Amer: 51 mL/min — ABNORMAL LOW (ref >60)
Glucose, Bld: 104 mg/dL — ABNORMAL HIGH (ref 65–99)
Potassium: 4.2 mmol/L (ref 3.5–5.1)
Sodium: 137 mmol/L (ref 135–145)

## 2016-10-10 LAB — HEMOGLOBIN A1C
Hgb A1c MFr Bld: 5.8 % — ABNORMAL HIGH (ref 4.8–5.6)
Mean Plasma Glucose: 120 mg/dL

## 2016-10-10 MED ORDER — FUROSEMIDE 20 MG PO TABS: 20.0000 mg | ORAL_TABLET | Freq: Every day | ORAL | Status: DC

## 2016-10-10 MED ORDER — FUROSEMIDE 40 MG PO TABS
40.0000 mg | ORAL_TABLET | Freq: Every day | ORAL | Status: DC
  Filled 2016-10-10: qty 1

## 2016-10-10 MED ORDER — GUAIFENESIN ER 600 MG PO TB12: 1200.0000 mg | ORAL_TABLET | Freq: Two times a day (BID) | ORAL | 0 refills | Status: DC

## 2016-10-10 MED ORDER — ONDANSETRON 4 MG PO TBDP: 4.0000 mg | ORAL_TABLET | Freq: Three times a day (TID) | ORAL | 0 refills | Status: AC | PRN

## 2016-10-10 MED ORDER — LOSARTAN POTASSIUM 100 MG PO TABS: 100.0000 mg | ORAL_TABLET | Freq: Every day | ORAL | 4 refills | Status: DC

## 2016-10-10 MED ORDER — ACETAMINOPHEN 325 MG PO TABS: 650.0000 mg | ORAL_TABLET | ORAL | 0 refills | Status: DC | PRN

## 2016-10-10 MED ORDER — GUAIFENESIN-DM 100-10 MG/5ML PO SYRP: 5.0000 mL | ORAL_SOLUTION | ORAL | 0 refills | Status: DC | PRN

## 2016-10-10 MED ORDER — MECLIZINE HCL 12.5 MG PO TABS: 12.5000 mg | ORAL_TABLET | Freq: Three times a day (TID) | ORAL | 2 refills | Status: AC | PRN

## 2016-10-10 MED ORDER — FUROSEMIDE 40 MG PO TABS: 40.0000 mg | ORAL_TABLET | Freq: Three times a day (TID) | ORAL | Status: DC

## 2016-10-10 MED ORDER — LEVOFLOXACIN 750 MG PO TABS
750.0000 mg | ORAL_TABLET | ORAL | Status: DC
  Administered 2016-10-11: 750 mg via ORAL
  Filled 2016-10-10: qty 1

## 2016-10-10 MED ORDER — LEVOFLOXACIN 750 MG PO TABS: 750.0000 mg | ORAL_TABLET | ORAL | 0 refills | Status: DC

## 2016-10-10 MED ORDER — FUROSEMIDE 10 MG/ML IJ SOLN
40.0000 mg | Freq: Once | INTRAMUSCULAR | Status: AC
  Administered 2016-10-10: 40 mg via INTRAVENOUS
  Filled 2016-10-10: qty 4

## 2016-10-10 MED ORDER — FUROSEMIDE 10 MG/ML IJ SOLN
40.0000 mg | Freq: Two times a day (BID) | INTRAMUSCULAR | Status: DC
  Administered 2016-10-10 – 2016-10-11 (×2): 40 mg via INTRAVENOUS
  Filled 2016-10-10 (×2): qty 4

## 2016-10-10 NOTE — Evaluation (Signed)
Occupational Therapy Evaluation and Discharge Patient Details Name: Bianca Matthews MRN: [id number] DOB: [birth year] Today's Date: 10/10/2016    History of Present Illness 83 y.o.femalepresenting to ED following a fall. Patient is unsure what happened. She went into the garage in her one story townhouse, turned around, and the next thing she knew she was on the floor. Not dizzy, no prewarning. No LOC. Hit her head hard on the concrete, lots of dizziness, + concussion, nausea following the fall. Pt with resulting skull fx and concussion. Patient was being prepped for discharge when she starting having increasing shortness of breath. Patient was noted to have O2 sat of 84% and was placed on supple oxygen. PMH: HTN.   Clinical Impression   Pt was independent prior to admission. Currently performing ADL and mobility with RW at a supervision level. Pt and daughter educated in safety and multiple uses of 3 in 1. Pt's 02 sats noted to drop to 85% after ambulating to bathroom. With deep breathing and 2L 02 recovered to 100% and maintained mid 90s after removal of 02 while remaining seated. Daughter plans to stay with pt upon discharge.   Follow Up Recommendations  No OT follow up;Supervision/Assistance - 24 hour (initially)    Equipment Recommendations  3 in 1 bedside commode (RW)    Recommendations for Other Services       Precautions / Restrictions Precautions Precautions: Fall Restrictions Weight Bearing Restrictions: No      Mobility Bed Mobility               General bed mobility comments: in chair  Transfers Overall transfer level: Needs assistance Equipment used: Rolling walker (2 wheeled) Transfers: Sit to/from Stand Sit to Stand: Supervision         General transfer comment: cues for hand placement and safety    Balance     Sitting balance-Leahy Scale: Good       Standing balance-Leahy Scale: Fair Standing balance comment: prefers to use RW                             ADL Overall ADL's : Needs assistance/impaired Eating/Feeding: Independent;Sitting   Grooming: Wash/dry hands;Standing;Modified independent   Upper Body Bathing: Set up;Sitting   Lower Body Bathing: Supervison/ safety;Sit to/from stand   Upper Body Dressing : Set up;Standing   Lower Body Dressing: Sit to/from stand;Supervision/safety   Toilet Transfer: RW;Supervision/safety   Toileting- Clothing Manipulation and Hygiene: Modified independent;Sit to/from stand   Tub/ Shower Transfer: Supervision/safety;Ambulation;3 in 1;Rolling walker   Functional mobility during ADLs: Supervision/safety;Rolling walker;Cueing for safety General ADL Comments: educated pt and daughter in uses of 3 in 1, fall prevention at home, transporting items with RW safely     Vision     Perception     Praxis      Pertinent Vitals/Pain Pain Assessment: Faces Faces Pain Scale: Hurts even more Pain Location: back Pain Descriptors / Indicators: Aching Pain Intervention(s): Monitored during session;Repositioned;Heat applied     Hand Dominance Left   Extremity/Trunk Assessment Upper Extremity Assessment Upper Extremity Assessment: Overall WFL for tasks assessed   Lower Extremity Assessment Lower Extremity Assessment: Defer to PT evaluation   Cervical / Trunk Assessment Cervical / Trunk Assessment: Other exceptions (chronic back pain)   Communication Communication Communication: HOH   Cognition Arousal/Alertness: Awake/alert Behavior During Therapy: WFL for tasks assessed/performed;Impulsive Overall Cognitive Status: Within Functional Limits for tasks assessed  General Comments       Exercises       Shoulder Instructions      Home Living Family/patient expects to be discharged to:: Private residence Living Arrangements: Alone Available Help at Discharge: Family;Available 24 hours/day Type of Home: House Home Access: Level  entry     Home Layout: One level     Bathroom Shower/Tub: Occupational psychologist: Standard     Home Equipment: None   Additional Comments: daughter will remain with pt at home after d/c, pt is active and drives      Prior Functioning/Environment Level of Independence: Independent                 OT Problem List: Impaired balance (sitting and/or standing);Decreased knowledge of use of DME or AE;Pain   OT Treatment/Interventions:      OT Goals(Current goals can be found in the care plan section) Acute Rehab OT Goals Patient Stated Goal: get back home  OT Frequency:     Barriers to D/C:            Co-evaluation              End of Session Equipment Utilized During Treatment: Gait belt;Rolling walker  Activity Tolerance: Patient tolerated treatment well Patient left: in chair;with call bell/phone within reach;with chair alarm set;with family/visitor present   Time: 1222-1252 OT Time Calculation (min): 30 min Charges:  OT General Charges $OT Visit: 1 Procedure OT Evaluation $OT Eval Moderate Complexity: 1 Procedure OT Treatments $Self Care/Home Management : 8-22 mins G-Codes:    Malka So 10/10/2016, 1:09 PM [phone]

## 2016-10-10 NOTE — Discharge Summary (Signed)
Physician Discharge Summary   Patient ID: Bianca Matthews MRN: [id number] DOB/AGE: 04/09/1928 83 y.o.  Admit date: 10/08/2016 Discharge date: 10/11/2016  Primary Care Physician:  Alysia Penna, MD  Discharge Diagnoses:    . Skull fracture with concussion ([place])   Acute hypoxic respiratory failure . acute Grade 2 diastolic CHF . Hypokalemia . Mechanical fall . Essential hypertension   Community-acquired pneumonia right lung   Consults:  None  Recommendations for Outpatient Follow-up:  1. DME rolling walker, 3 and 1 arranged by case management 2. Home O2 evaluation done, 3 L with exertion to be arranged by case management 3. Please repeat CBC/BMET at next visit 4. Referral has been sent to [facility] cardiology office, patient will be scheduled for appointment (i sent email message to Trish to schedule appointment)  Patient has been given the prescription of Toprol-XL by her PCP but she wants to finish the prescription of atenolol at home before she starts the Toprol-XL.   DIET: Heart healthy diet    Allergies:  No Known Allergies   DISCHARGE MEDICATIONS: Current Discharge Medication List    START taking these medications   Details  acetaminophen (TYLENOL) 325 MG tablet Take 2 tablets (650 mg total) by mouth every 4 (four) hours as needed for headache or mild pain. Qty: 30 tablet, Refills: 0    guaiFENesin (MUCINEX) 600 MG 12 hr tablet Take 2 tablets (1,200 mg total) by mouth 2 (two) times daily. Qty: 60 tablet, Refills: 0    guaiFENesin-dextromethorphan (ROBITUSSIN DM) 100-10 MG/5ML syrup Take 5 mLs by mouth every 4 (four) hours as needed for cough. Qty: 118 mL, Refills: 0    levofloxacin (LEVAQUIN) 750 MG tablet Take 1 tablet (750 mg total) by mouth every other day. Qty: 5 tablet, Refills: 0    losartan (COZAAR) 100 MG tablet Take 1 tablet (100 mg total) by mouth daily. Qty: 30 tablet, Refills: 4    meclizine (ANTIVERT) 12.5 MG tablet Take 1 tablet (12.5 mg total) by  mouth 3 (three) times daily as needed for dizziness. Also available over the counter Qty: 90 tablet, Refills: 2    ondansetron (ZOFRAN ODT) 4 MG disintegrating tablet Take 1 tablet (4 mg total) by mouth every 8 (eight) hours as needed for nausea or vomiting. Qty: 20 tablet, Refills: 0      CONTINUE these medications which have CHANGED   Details  metoprolol succinate (TOPROL-XL) 100 MG 24 hr tablet Take 1 tablet (100 mg total) by mouth daily. Take with or immediately following a meal. Start after atenolol is finished. Qty: 90 tablet, Refills: 3      CONTINUE these medications which have NOT CHANGED   Details  Ascorbic Acid (VITAMIN C PO) Take 1 tablet by mouth daily.    aspirin 325 MG tablet Take 325 mg by mouth every evening. 4pm    atenolol (TENORMIN) 100 MG tablet Take 100 mg by mouth 2 (two) times daily.    co-enzyme Q-10 30 MG capsule Take 30 mg by mouth daily.    furosemide (LASIX) 40 MG tablet Take 1 tablet (40 mg total) by mouth 3 (three) times daily. Qty: 270 tablet, Refills: 3    Multiple Vitamin (MULTIVITAMIN WITH MINERALS) TABS tablet Take 1 tablet by mouth daily. Women's One a Day    omeprazole (PRILOSEC) 40 MG capsule TAKE 1 CAPSULE EVERY DAY Qty: 90 capsule, Refills: 3    potassium chloride SA (K-DUR,KLOR-CON) 20 MEQ tablet TAKE 1 TABLET EVERY DAY Qty: 90 tablet, Refills: 3  PRESCRIPTION MEDICATION Inject into the skin every Monday. Weekly allergy injections done at Dr. Renne Musca office    Probiotic Product (PROBIOTIC FORMULA PO) Take 1 tablet by mouth daily.     rosuvastatin (CRESTOR) 10 MG tablet Take 1 tablet (10 mg total) by mouth at bedtime. Qty: 90 tablet, Refills: 3      STOP taking these medications     losartan-hydrochlorothiazide (HYZAAR) 100-12.5 MG tablet          Brief H and P: For complete details please refer to admission H and P, but in brief Patient is a 83 year old female with hypertension, hyperlipidemia, GERD presented after a  mechanical fall. She hit her head hard on the concrete with subsequent dizziness, nausea and concussion after the fall. In the ER, patient was noted to be in mild respiratory distress with hypoxia and she reported having episodes of shortness of breath, dyspnea on exertion. She was diagnosed with bronchitis on 10/12 and finished a course of a Zithromax.  CT head and cervical spine was significant for a nondisplaced and non-depressed right parietal toe occipital skull fracture. No head bleed or cervical fracture.  Patient was noted to have O2 sats of 84% in ED and was placed on supplemental O2.  Patient was admitted for fall/head injury, concussion and acute hypoxic respiratory failure.   Hospital Course:  Skull fracture with concussion (HCC)With dizziness - CT head and cervical spine was significant for a nondisplaced and non-depressed right parietal toe occipital skull fracture. No head bleed or cervical fracture.  - Patient had significant dizziness on getting up, possibly had syncopal episode that led to the fall however patient could not recall the events. Lives alone - PTOT consult recommended home health PT - Initially placed on meclizine scheduled due to significant dizziness, Zofran as needed.  - PTOT evaluation recommended 24 7 supervision. Patient feels a lot improved today. I spoke with Dr. Kary Kos, neurosurgery who did not feel patient needed any neurosurgical intervention as this is expected post concussive symptoms. However patient can see him in the office as needed if she has long protracted course of her symptoms.  Acute hypoxic respiratory failure, community acquired pneumonia - BNP was elevated at 296 with chest x-ray showing right lung opacity may reflect atelectasis or possibility of pneumonia. Cardiomegaly but no convincing pulmonary edema. - Patient received Lasix in ED. Patient reporting significant cough and productive phlegm, placed on Levaquin, Mucinex,  Robitussin  Acute diastolic CHF - Per patient's family, she has been taking Lasix for a long time for peripheral edema however she did not have any prior echo. She was being worked up for CHF outpatient. 2-D echo was done which showed preserved EF however has grade 2 diastolic dysfunction. - She received IV Lasix in patient and will continue outpatient dose of Lasix '40mg'$  TID at discharge.   Mechanical fall - PTOT consult recommended home health PT  Hypokalemia - Resolved with supplementation    Essential hypertension - Currently stable, continue Tenormin (changing to metoprolol once she runs out of her current supply) -Discontinued hydrochlorothiazide and Lasix dosing (concern for renal stress)    Hyperglycemia - No listed history of diabetes mellitus, will check hemoglobin A1c  Day of Discharge BP (!) 90/41 (BP Location: Left Arm)   Pulse 66   Temp 97.6 F (36.4 C) (Oral)   Resp 20   Ht '5\' 4"'$  (1.626 m)   Wt 85 kg (187 lb 8 oz) Comment: scale c  SpO2 92%   BMI  32.18 kg/m   Physical Exam: General: Alert and awake oriented x3 not in any acute distress. HEENT: anicteric sclera, pupils reactive to light and accommodation CVS: S1-S2 clear no murmur rubs or gallops Chest: clear to auscultation bilaterally, no wheezing rales or rhonchi Abdomen: soft nontender, nondistended, normal bowel sounds Extremities: no cyanosis, clubbing or edema noted bilaterally Neuro: Cranial nerves II-XII intact, no focal neurological deficits   The results of significant diagnostics from this hospitalization (including imaging, microbiology, ancillary and laboratory) are listed below for reference.    LAB RESULTS: Basic Metabolic Panel:  Recent Labs Lab 10/10/16 0408 10/11/16 0529  NA 137 134*  K 4.2 4.2  CL 99* 96*  CO2 27 30  GLUCOSE 104* 120*  BUN 23* 36*  CREATININE 0.97 1.16*  CALCIUM 9.2 8.7*   Liver Function Tests: No results for input(s): AST, ALT, ALKPHOS, BILITOT, PROT,  ALBUMIN in the last 168 hours. No results for input(s): LIPASE, AMYLASE in the last 168 hours. No results for input(s): AMMONIA in the last 168 hours. CBC:  Recent Labs Lab 10/08/16 1529 10/09/16 0248  WBC 9.8 9.6  NEUTROABS  --  7.9*  HGB 12.5 12.4  HCT 38.5 38.5  MCV 92.8 93.4  PLT 210 213   Cardiac Enzymes: No results for input(s): CKTOTAL, CKMB, CKMBINDEX, TROPONINI in the last 168 hours. BNP: Invalid input(s): POCBNP CBG: No results for input(s): GLUCAP in the last 168 hours.  Significant Diagnostic Studies:  Ct Head Wo Contrast  Result Date: 10/08/2016 CLINICAL DATA:  FALL THIS AM WITHOUT LOC PER PATIENT PATIENT FELL AND STRUCK OCCIPITAL PORTION OF HEAD HEADACHE AND DIZZINESS SINCE FALL AND NECK PAIN PMH: HTN, Allergy; GERD ; Hyperlipidemia; Varicose veins EXAM: CT HEAD WITHOUT CONTRAST CT CERVICAL SPINE WITHOUT CONTRAST TECHNIQUE: Multidetector CT imaging of the head and cervical spine was performed following the standard protocol without intravenous contrast. Multiplanar CT image reconstructions of the cervical spine were also generated. COMPARISON:  None. FINDINGS: CT HEAD FINDINGS Brain: No evidence of acute infarction, hemorrhage, hydrocephalus, extra-axial collection or mass lesion/mass effect. The ventricles and sulci are enlarged reflecting age appropriate volume loss. Minor periventricular white matter hypoattenuation is noted consistent with chronic microvascular ischemic change. Vascular: No hyperdense vessel or unexpected calcification. Skull: There is a nondisplaced, nondepressed fracture of the right posterior parietal and occipital bone lying adjacent to the lambdoid suture. Sinuses/Orbits: Globes orbits are unremarkable. Sinuses and mastoid air cells are clear. Other: Right posterior parietal scalp hematoma. CT CERVICAL SPINE FINDINGS Alignment: Normal. Skull base and vertebrae: No acute fracture. No primary bone lesion or focal pathologic process. Soft tissues and  spinal canal: No prevertebral fluid or swelling. No visible canal hematoma. Disc levels: The disc spaces are well maintained. No disc herniations or significant disc bulging. Facet degenerative changes noted on the left at C2-C3. Spinal canal and neural foramina are widely patent. Upper chest: Unremarkable. Other: None IMPRESSION: HEAD CT 1. No acute intracranial abnormality.  No intracranial hemorrhage. 2. Nondisplaced, nondepressed right posterior parietal/occipital skull fracture. This lies underneath the right posterior parietal scalp hematoma. CERVICAL CT 1. No fracture or acute finding. Electronically Signed   By: Lajean Manes M.D.   On: 10/08/2016 16:14   Ct Cervical Spine Wo Contrast  Result Date: 10/08/2016 CLINICAL DATA:  FALL THIS AM WITHOUT LOC PER PATIENT PATIENT FELL AND STRUCK OCCIPITAL PORTION OF HEAD HEADACHE AND DIZZINESS SINCE FALL AND NECK PAIN PMH: HTN, Allergy; GERD ; Hyperlipidemia; Varicose veins EXAM: CT HEAD WITHOUT CONTRAST CT CERVICAL  SPINE WITHOUT CONTRAST TECHNIQUE: Multidetector CT imaging of the head and cervical spine was performed following the standard protocol without intravenous contrast. Multiplanar CT image reconstructions of the cervical spine were also generated. COMPARISON:  None. FINDINGS: CT HEAD FINDINGS Brain: No evidence of acute infarction, hemorrhage, hydrocephalus, extra-axial collection or mass lesion/mass effect. The ventricles and sulci are enlarged reflecting age appropriate volume loss. Minor periventricular white matter hypoattenuation is noted consistent with chronic microvascular ischemic change. Vascular: No hyperdense vessel or unexpected calcification. Skull: There is a nondisplaced, nondepressed fracture of the right posterior parietal and occipital bone lying adjacent to the lambdoid suture. Sinuses/Orbits: Globes orbits are unremarkable. Sinuses and mastoid air cells are clear. Other: Right posterior parietal scalp hematoma. CT CERVICAL SPINE  FINDINGS Alignment: Normal. Skull base and vertebrae: No acute fracture. No primary bone lesion or focal pathologic process. Soft tissues and spinal canal: No prevertebral fluid or swelling. No visible canal hematoma. Disc levels: The disc spaces are well maintained. No disc herniations or significant disc bulging. Facet degenerative changes noted on the left at C2-C3. Spinal canal and neural foramina are widely patent. Upper chest: Unremarkable. Other: None IMPRESSION: HEAD CT 1. No acute intracranial abnormality.  No intracranial hemorrhage. 2. Nondisplaced, nondepressed right posterior parietal/occipital skull fracture. This lies underneath the right posterior parietal scalp hematoma. CERVICAL CT 1. No fracture or acute finding. Electronically Signed   By: Lajean Manes M.D.   On: 10/08/2016 16:14   Dg Chest Portable 1 View  Result Date: 10/08/2016 CLINICAL DATA:  From home via EMS: reports tripped over step in garage and fell. Pt had a destat episode, R/o pneumothorax EXAM: PORTABLE CHEST 1 VIEW COMPARISON:  None. FINDINGS: Cardiac silhouette is mildly enlarged. No convincing mediastinal or hilar masses. Opacity extends from the region of the right hilum obliquely to the right lateral lung base obscuring hemidiaphragm. This is likely atelectasis. Consider pneumonia if there are consistent clinical symptoms. There are mildly prominent bronchovascular markings bilaterally. No convincing pulmonary edema. No pneumothorax. Skeletal structures are demineralized but grossly intact. IMPRESSION: 1. No pneumothorax. 2. Right lung base opacity which may reflect atelectasis. Pneumonia is possible. 3. No pulmonary edema.  Mild cardiomegaly. Electronically Signed   By: Lajean Manes M.D.   On: 10/08/2016 17:57    2D ECHO:  Study Conclusions  - Left ventricle: The cavity size was normal. There was moderate   concentric hypertrophy. Systolic function was normal. The   estimated ejection fraction was in the range of  60% to 65%. Wall   motion was normal; there were no regional wall motion   abnormalities. Features are consistent with a pseudonormal left   ventricular filling pattern, with concomitant abnormal relaxation   and increased filling pressure (grade 2 diastolic dysfunction).   Doppler parameters are consistent with high ventricular filling   pressure. - Aortic valve: There was very mild stenosis. Valve area (VTI):   1.72 cm^2. Valve area (Vmax): 1.74 cm^2. Valve area (Vmean): 1.81   cm^2. - Mitral valve: There was mild regurgitation. - Atrial septum: There was increased thickness of the septum,   consistent with lipomatous hypertrophy. - Pulmonary arteries: PA peak pressure: 33 mm Hg (S). Disposition and Follow-up: Discharge Instructions    (HEART FAILURE PATIENTS) Call MD:  Anytime you have any of the following symptoms: 1) 3 pound weight gain in 24 hours or 5 pounds in 1 week 2) shortness of breath, with or without a dry hacking cough 3) swelling in the hands, feet or stomach  4) if you have to sleep on extra pillows at night in order to breathe.    Complete by:  As directed    Diet - low sodium heart healthy    Complete by:  As directed    Discharge instructions    Complete by:  As directed    Referral has been sent to [facility] heart care- cardiology office, you will be called for appointment by their office when scheduled.  Fluid restriction 1.5 Liters/24hours. Salt in moderation   Increase activity slowly    Complete by:  As directed        DISPOSITION: [place]    Alysia Penna, MD Follow up on 10/13/2016.   Specialty:  Family Medicine Why:  For concussion follow up Contact information: [place] [address] [phone]        Elaina Hoops, MD. Schedule an appointment as soon as possible for a visit.   Specialty:  Neurosurgery Why:  if needed  Contact information: [address] [address] [phone]        [place].   Specialty:  [place] Why:  They will do your home health care at your home Contact information: [place] [address] [phone]            Time spent on Discharge: 35 minutes  Signed:   Aariv Medlock M.D. [facility] 10/11/2016, 12:43 PM Pager: [phone]

## 2016-10-10 NOTE — Care Management Note (Signed)
Case Management Note  Patient Details  Name: Bianca Matthews MRN: [id number] Date of Birth: 1928/07/25  Subjective/Objective:         Admitted with Skull fracture due to fall           Action/Plan: CM talked patient and daughter at the bedside; Her daughter and son plan to stay with her at discharge; also patient could benefit from [facility] services; [place] choice offered, daughter chose [place]; Hoyle Sauer with [facility] called for arrangements; DME ordered, rolling walker, 3:1; Attending MD at discharge please enter the face to face medicare document for [facility] services.  Expected Discharge Date:    possibly 10/11/2016              Expected Discharge Plan:  [place]  Discharge planning Services  CM Consult     Choice offered to:  Patient, Adult Children  DME Arranged:   RW,3:1 DME Agency:   [facility]  HH Arranged:  RN, PT [facility] Agency:  [place]  Status of Service:  In process, will continue to follow  Sherrilyn Rist [phone] 10/10/2016, 3:07 PM

## 2016-10-10 NOTE — Progress Notes (Signed)
SATURATION QUALIFICATIONS: (This note is used to comply with regulatory documentation for home oxygen)  Patient Saturations on Room Air at Rest = 89%  Patient Saturations on Room Air while Ambulating = 85%  Patient Saturations on 2 Liters of oxygen while Ambulating = 91%  Please briefly explain why patient needs home oxygen: desaturated with activity  Alben Deeds, [place] DPT  [phone]

## 2016-10-10 NOTE — Progress Notes (Signed)
Physical Therapy Treatment Patient Details Name: Bianca Matthews MRN: [id number] DOB: 11-Aug-1928 Today's Date: 10/10/2016    History of Present Illness 83 y.o.femalepresenting to ED following a fall. Patient is unsure what happened. She went into the garage in her one story townhouse, turned around, and the next thing she knew she was on the floor. Not dizzy, no prewarning. No LOC. Hit her head hard on the concrete, lots of dizziness, + concussion, nausea following the fall. Pt with resulting skull fx and concussion.     PT Comments    Patient seen for mobility progression tolerated increased ambulation in hall today with supplemental oxygen. On room air at rest patient 89%, with activity dropped to 85%, with 2 liters improved >90%. Will continue to see and progress as tolerated.  Follow Up Recommendations  Home health PT;Supervision/Assistance - 24 hour     Equipment Recommendations  Rolling walker with 5" wheels    Recommendations for Other Services       Precautions / Restrictions Precautions Precautions: Fall Restrictions Weight Bearing Restrictions: No    Mobility  Bed Mobility               General bed mobility comments: received in recliner  Transfers Overall transfer level: Needs assistance Equipment used: Rolling walker (2 wheeled) Transfers: Sit to/from Stand Sit to Stand: Min guard         General transfer comment: guard for safety, transfers performed from bed and toilet height.   Ambulation/Gait Ambulation/Gait assistance: Supervision Ambulation Distance (Feet): 580 Feet Assistive device: Rolling walker (2 wheeled) Gait Pattern/deviations: Step-through pattern Gait velocity: decreased   General Gait Details: VCs for upright posture and positioning with use of RW. Ambulated on 2 liters supplemental O2 with saturations >90%, on room air quickly desaturates to low 80s.   Stairs            Wheelchair Mobility    Modified Rankin  (Stroke Patients Only)       Balance Overall balance assessment: Needs assistance Sitting-balance support: No upper extremity supported Sitting balance-Leahy Scale: Good     Standing balance support: During functional activity Standing balance-Leahy Scale: Fair Standing balance comment: using rw for stability during ambulation                    Cognition Arousal/Alertness: Awake/alert Behavior During Therapy: WFL for tasks assessed/performed;Impulsive Overall Cognitive Status: Within Functional Limits for tasks assessed                 General Comments: Family reports that the pt is congnitively at her baseline. She was mildly impulsive during session, not following cues consistently for safety     Exercises      General Comments        Pertinent Vitals/Pain Pain Assessment: Faces Faces Pain Scale: Hurts little more Pain Location: back Pain Descriptors / Indicators: Aching Pain Intervention(s): Monitored during session    Home Living Family/patient expects to be discharged to:: Private residence Living Arrangements: Alone Available Help at Discharge: Family;Available 24 hours/day Type of Home: House Home Access: Level entry   Home Layout: One level Home Equipment: None Additional Comments: daughter will remain with pt at home after d/c, pt is active and drives    Prior Function Level of Independence: Independent          PT Goals (current goals can now be found in the care plan section) Acute Rehab PT Goals Patient Stated Goal: get back home PT Goal Formulation: With  patient/family Time For Goal Achievement: 10/23/16 Potential to Achieve Goals: Good Progress towards PT goals: Progressing toward goals    Frequency    Min 3X/week      PT Plan Current plan remains appropriate    Co-evaluation             End of Session Equipment Utilized During Treatment: Gait belt;Oxygen Activity Tolerance: Patient tolerated treatment  well;Patient limited by fatigue (mild dizziness reported) Patient left: in chair;with call bell/phone within reach;with chair alarm set;with family/visitor present     Time: 3614-4315 PT Time Calculation (min) (ACUTE ONLY): 18 min  Charges:  $Gait Training: 8-22 mins                    G Codes:      Duncan Dull 10/21/2016, 2:37 PM Alben Deeds, [place] DPT  [phone]

## 2016-10-10 NOTE — Progress Notes (Signed)
[facility]                                                                              Patient Demographics  Bianca Matthews, is a 83 y.o. female, DOB - 05-19-1928, IOE:[id number]  Admit date - 10/08/2016   Admitting Physician Karmen Bongo, MD  Outpatient Primary MD for the patient is Alysia Penna, MD  Outpatient specialists:   LOS - 2  days    Chief Complaint  Patient presents with  . Fall  . Laceration       Brief summary   Patient is a 83 year old female with hypertension, hyperlipidemia, GERD presented after a mechanical fall. She hit her head hard on the concrete with subsequent dizziness, nausea and concussion after the fall. In the ER, patient was noted to be in mild respiratory distress with hypoxia and she reported having episodes of shortness of breath, dyspnea on exertion. She was diagnosed with bronchitis on 10/12 and finished a course of a Zithromax.  CT head and cervical spine was significant for a nondisplaced and non-depressed right parietal toe occipital skull fracture. No head bleed or cervical fracture.  Patient was noted to have O2 sats of 84% in ED and was placed on supplemental O2.  Patient was admitted for fall/head injury, concussion and acute hypoxic respiratory failure.   Assessment & Plan    Principal Problem:   Skull fracture with concussion (HCC)With dizziness - CT head and cervical spine was significant for a nondisplaced and non-depressed right parietal toe occipital skull fracture. No head bleed or cervical fracture.  - Patient still has significant dizziness on getting up, possibly had syncopal episode that led to the fall however patient cannot recall the events. Lives alone - PTOT consult - Placed on meclizine scheduled today due to significant dizziness, Zofran as needed - PTOT evaluation recommended 24 7 supervision. Patient feels a lot improved today. I spoke with Dr. Kary Kos, neurosurgery who did not feel patient needed  any neurosurgical intervention as this is expected post concussive symptoms. However patient can see him in the office as needed if she has long protracted course of her symptoms.  Active Problems: Acute hypoxic respiratory failure:  - BNP was elevated at 296 with chest x-ray showing right lung opacity may reflect atelectasis or possibility of pneumonia. Cardiomegaly but no convincing pulmonary edema. - Patient reporting significant cough and productive phlegm,  -  placed on Levaquin, Mucinex, Robitussin  Acute diastolic CHF - Per patient's family, she has been taking Lasix for a long time for peripheral edema however she did not have any prior echo. She was being worked up for CHF outpatient. 2-D echo was done which showed reserved EF however has grade 2 diastolic dysfunction. - She takes outpatient dose of Lasix '40mg'$  TID, placed on Lasix 40 mg IV twice a day - Strict I's and O's and daily weights - Initially plan was to DC home today however per RN, unable to wean her off O2 - will arrange Outpatient cardiology follow-up  Mechanical fall - PTOT consult-> 24 7 supervision  Hypokalemia - Resolved with supplementation    Essential hypertension - Currently  stable, continue Tenormin (changing to metoprolol once she runs out of her current supply) - discontinued hydrochlorothiazide, continue Lasix, losartan      Hyperglycemia - No listed history of diabetes mellitus, hemoglobin A1c 5.8   Code Status: DNR  DVT Prophylaxis:   SCD's Family Communication: Discussed in detail with the patient, all imaging results, lab results explained to the patient, son and daughter   Disposition Plan: likely in am   Time Spent in minutes   25 minutes  Procedures:   Consultants:     Antimicrobials:   levaquin 1/8   Medications  Scheduled Meds: . aspirin  325 mg Oral QPM  . atenolol  100 mg Oral BID  . furosemide  40 mg Intravenous BID  . guaiFENesin  1,200 mg Oral BID  . [START ON  10/11/2016] levofloxacin  750 mg Oral Q48H  . losartan  100 mg Oral Daily  . meclizine  12.5 mg Oral TID  . pantoprazole  40 mg Oral Daily  . rosuvastatin  10 mg Oral QPM  . sodium chloride flush  3 mL Intravenous Q12H   Continuous Infusions: PRN Meds:.sodium chloride, acetaminophen, guaiFENesin-dextromethorphan, ondansetron (ZOFRAN) IV, sodium chloride flush   Antibiotics   Anti-infectives    Start     Dose/Rate Route Frequency Ordered Stop   10/11/16 1000  levofloxacin (LEVAQUIN) tablet 750 mg     750 mg Oral Every 48 hours 10/10/16 0907     10/11/16 0000  levofloxacin (LEVAQUIN) 750 MG tablet     750 mg Oral Every 48 hours 10/10/16 0912     10/09/16 1000  levofloxacin (LEVAQUIN) tablet 750 mg  Status:  Discontinued     750 mg Oral Daily 10/09/16 0932 10/10/16 6948        Subjective:   Stephanee Barcomb was seen and examined today. Much improved today, dizziness and coughing improving. denied   chest pain, abdominal pain, N/V/D/C, new weakness, numbess, tingling. No acute events overnight.  + Intermittent episodes of dyspnea on exertion for last several weeks  Objective:   Vitals:   10/09/16 1950 10/10/16 0602 10/10/16 1028 10/10/16 1045  BP: (!) 109/49 (!) 114/48    Pulse: 72 65    Resp: 18 18    Temp: 98.2 F (36.8 C) 98.5 F (36.9 C)    TempSrc: Oral Oral    SpO2: 94% 95% 90% (!) 87%  Weight:  85 kg (187 lb 8 oz)    Height:        Intake/Output Summary (Last 24 hours) at 10/10/16 1344 Last data filed at 10/10/16 1058  Gross per 24 hour  Intake              923 ml  Output              303 ml  Net              620 ml     Wt Readings from Last 3 Encounters:  10/10/16 85 kg (187 lb 8 oz)  07/27/16 84.4 kg (186 lb)  07/13/16 84.4 kg (186 lb)     Exam  General: Alert and oriented x 3, NAD  HEENT:    Neck: Supple, no JVD  Cardiovascular: S1 S2 clear, RRR  Respiratory: dec BS at bases  Gastrointestinal: Soft, nontender, nondistended, + bowel  sounds  Ext: no cyanosis clubbing or edema  Neuro:  no new deficits   Skin: No rashes  Psych: Normal affect and demeanor, alert and oriented x3  Data Reviewed:  I have personally reviewed following labs and imaging studies  Micro Results No results found for this or any previous visit (from the past 240 hour(s)).  Radiology Reports Ct Head Wo Contrast  Result Date: 10/08/2016 CLINICAL DATA:  FALL THIS AM WITHOUT LOC PER PATIENT PATIENT FELL AND STRUCK OCCIPITAL PORTION OF HEAD HEADACHE AND DIZZINESS SINCE FALL AND NECK PAIN PMH: HTN, Allergy; GERD ; Hyperlipidemia; Varicose veins EXAM: CT HEAD WITHOUT CONTRAST CT CERVICAL SPINE WITHOUT CONTRAST TECHNIQUE: Multidetector CT imaging of the head and cervical spine was performed following the standard protocol without intravenous contrast. Multiplanar CT image reconstructions of the cervical spine were also generated. COMPARISON:  None. FINDINGS: CT HEAD FINDINGS Brain: No evidence of acute infarction, hemorrhage, hydrocephalus, extra-axial collection or mass lesion/mass effect. The ventricles and sulci are enlarged reflecting age appropriate volume loss. Minor periventricular white matter hypoattenuation is noted consistent with chronic microvascular ischemic change. Vascular: No hyperdense vessel or unexpected calcification. Skull: There is a nondisplaced, nondepressed fracture of the right posterior parietal and occipital bone lying adjacent to the lambdoid suture. Sinuses/Orbits: Globes orbits are unremarkable. Sinuses and mastoid air cells are clear. Other: Right posterior parietal scalp hematoma. CT CERVICAL SPINE FINDINGS Alignment: Normal. Skull base and vertebrae: No acute fracture. No primary bone lesion or focal pathologic process. Soft tissues and spinal canal: No prevertebral fluid or swelling. No visible canal hematoma. Disc levels: The disc spaces are well maintained. No disc herniations or significant disc bulging. Facet degenerative  changes noted on the left at C2-C3. Spinal canal and neural foramina are widely patent. Upper chest: Unremarkable. Other: None IMPRESSION: HEAD CT 1. No acute intracranial abnormality.  No intracranial hemorrhage. 2. Nondisplaced, nondepressed right posterior parietal/occipital skull fracture. This lies underneath the right posterior parietal scalp hematoma. CERVICAL CT 1. No fracture or acute finding. Electronically Signed   By: Lajean Manes M.D.   On: 10/08/2016 16:14   Ct Cervical Spine Wo Contrast  Result Date: 10/08/2016 CLINICAL DATA:  FALL THIS AM WITHOUT LOC PER PATIENT PATIENT FELL AND STRUCK OCCIPITAL PORTION OF HEAD HEADACHE AND DIZZINESS SINCE FALL AND NECK PAIN PMH: HTN, Allergy; GERD ; Hyperlipidemia; Varicose veins EXAM: CT HEAD WITHOUT CONTRAST CT CERVICAL SPINE WITHOUT CONTRAST TECHNIQUE: Multidetector CT imaging of the head and cervical spine was performed following the standard protocol without intravenous contrast. Multiplanar CT image reconstructions of the cervical spine were also generated. COMPARISON:  None. FINDINGS: CT HEAD FINDINGS Brain: No evidence of acute infarction, hemorrhage, hydrocephalus, extra-axial collection or mass lesion/mass effect. The ventricles and sulci are enlarged reflecting age appropriate volume loss. Minor periventricular white matter hypoattenuation is noted consistent with chronic microvascular ischemic change. Vascular: No hyperdense vessel or unexpected calcification. Skull: There is a nondisplaced, nondepressed fracture of the right posterior parietal and occipital bone lying adjacent to the lambdoid suture. Sinuses/Orbits: Globes orbits are unremarkable. Sinuses and mastoid air cells are clear. Other: Right posterior parietal scalp hematoma. CT CERVICAL SPINE FINDINGS Alignment: Normal. Skull base and vertebrae: No acute fracture. No primary bone lesion or focal pathologic process. Soft tissues and spinal canal: No prevertebral fluid or swelling. No  visible canal hematoma. Disc levels: The disc spaces are well maintained. No disc herniations or significant disc bulging. Facet degenerative changes noted on the left at C2-C3. Spinal canal and neural foramina are widely patent. Upper chest: Unremarkable. Other: None IMPRESSION: HEAD CT 1. No acute intracranial abnormality.  No intracranial hemorrhage. 2. Nondisplaced, nondepressed right posterior parietal/occipital skull fracture. This lies  underneath the right posterior parietal scalp hematoma. CERVICAL CT 1. No fracture or acute finding. Electronically Signed   By: Lajean Manes M.D.   On: 10/08/2016 16:14   Dg Chest Portable 1 View  Result Date: 10/08/2016 CLINICAL DATA:  From home via EMS: reports tripped over step in garage and fell. Pt had a destat episode, R/o pneumothorax EXAM: PORTABLE CHEST 1 VIEW COMPARISON:  None. FINDINGS: Cardiac silhouette is mildly enlarged. No convincing mediastinal or hilar masses. Opacity extends from the region of the right hilum obliquely to the right lateral lung base obscuring hemidiaphragm. This is likely atelectasis. Consider pneumonia if there are consistent clinical symptoms. There are mildly prominent bronchovascular markings bilaterally. No convincing pulmonary edema. No pneumothorax. Skeletal structures are demineralized but grossly intact. IMPRESSION: 1. No pneumothorax. 2. Right lung base opacity which may reflect atelectasis. Pneumonia is possible. 3. No pulmonary edema.  Mild cardiomegaly. Electronically Signed   By: Lajean Manes M.D.   On: 10/08/2016 17:57    Lab Data:  CBC:  Recent Labs Lab 10/08/16 1529 10/09/16 0248  WBC 9.8 9.6  NEUTROABS  --  7.9*  HGB 12.5 12.4  HCT 38.5 38.5  MCV 92.8 93.4  PLT 210 676   Basic Metabolic Panel:  Recent Labs Lab 10/08/16 1529 10/09/16 0248 10/10/16 0408  NA 134* 136 137  K 3.0* 4.5 4.2  CL 96* 98* 99*  CO2 '29 31 27  '$ GLUCOSE 180* 122* 104*  BUN 26* 22* 23*  CREATININE 0.79 0.84 0.97   CALCIUM 9.3 9.1 9.2   GFR: Estimated Creatinine Clearance: 43.9 mL/min (by C-G formula based on SCr of 0.97 mg/dL). Liver Function Tests: No results for input(s): AST, ALT, ALKPHOS, BILITOT, PROT, ALBUMIN in the last 168 hours. No results for input(s): LIPASE, AMYLASE in the last 168 hours. No results for input(s): AMMONIA in the last 168 hours. Coagulation Profile: No results for input(s): INR, PROTIME in the last 168 hours. Cardiac Enzymes: No results for input(s): CKTOTAL, CKMB, CKMBINDEX, TROPONINI in the last 168 hours. BNP (last 3 results) No results for input(s): PROBNP in the last 8760 hours. HbA1C:  Recent Labs  10/09/16 1124  HGBA1C 5.8*   CBG: No results for input(s): GLUCAP in the last 168 hours. Lipid Profile: No results for input(s): CHOL, HDL, LDLCALC, TRIG, CHOLHDL, LDLDIRECT in the last 72 hours. Thyroid Function Tests: No results for input(s): TSH, T4TOTAL, FREET4, T3FREE, THYROIDAB in the last 72 hours. Anemia Panel: No results for input(s): VITAMINB12, FOLATE, FERRITIN, TIBC, IRON, RETICCTPCT in the last 72 hours. Urine analysis:    Component Value Date/Time   BILIRUBINUR n 02/24/2015 0937   PROTEINUR n 02/24/2015 0937   UROBILINOGEN 0.2 02/24/2015 0937   NITRITE n 02/24/2015 0937   LEUKOCYTESUR Trace 02/24/2015 1950     Bartow Zylstra M.D. [facility] 10/10/2016, 1:44 PM  Pager: [phone] Between 7am to 7pm - call Pager - [phone]  After 7pm go to [email or web address] - password [email or web address]  Call night coverage person covering after 7pm

## 2016-10-11 LAB — BASIC METABOLIC PANEL
Anion gap: 8 (ref 5–15)
BUN: 36 mg/dL — ABNORMAL HIGH (ref 6–20)
CO2: 30 mmol/L (ref 22–32)
Calcium: 8.7 mg/dL — ABNORMAL LOW (ref 8.9–10.3)
Chloride: 96 mmol/L — ABNORMAL LOW (ref 101–111)
Creatinine, Ser: 1.16 mg/dL — ABNORMAL HIGH (ref 0.44–1.00)
GFR calc Af Amer: 48 mL/min — ABNORMAL LOW (ref >60)
GFR calc non Af Amer: 41 mL/min — ABNORMAL LOW (ref >60)
Glucose, Bld: 120 mg/dL — ABNORMAL HIGH (ref 65–99)
Potassium: 4.2 mmol/L (ref 3.5–5.1)
Sodium: 134 mmol/L — ABNORMAL LOW (ref 135–145)

## 2016-10-11 MED ORDER — METOPROLOL SUCCINATE ER 100 MG PO TB24: 100.0000 mg | ORAL_TABLET | Freq: Every day | ORAL | 3 refills | Status: DC

## 2016-10-11 NOTE — Progress Notes (Signed)
Physical Therapy Treatment Patient Details Name: Bianca Matthews MRN: [id number] DOB: 06-17-1928 Today's Date: 10/11/2016    History of Present Illness 83 y.o.femalepresenting to ED following a fall. Patient is unsure what happened. She went into the garage in her one story townhouse, turned around, and the next thing she knew she was on the floor. Not dizzy, no prewarning. No LOC. Hit her head hard on the concrete, lots of dizziness, + concussion, nausea following the fall. Pt with resulting skull fx and concussion.   PT Comments    Patient seen for mobility progression tolerate ambulation with supervision today. Discussed patient symptoms [id number] and planned for return visit this morning for vestibular assessment. Patient and family very appreciative. Current POC remains appropriate.  Follow Up Recommendations  Home health PT;Supervision/Assistance - 24 hour     Equipment Recommendations  Rolling walker with 5" wheels    Recommendations for Other Services  (vestibular assessment)     Precautions / Restrictions Precautions Precautions: Fall Restrictions Weight Bearing Restrictions: No    Mobility  Bed Mobility               General bed mobility comments: received in recliner  Transfers Overall transfer level: Needs assistance Equipment used: Rolling walker (2 wheeled) Transfers: Sit to/from Stand Sit to Stand: Supervision         General transfer comment: no physical assist, cues for hand placement  Ambulation/Gait Ambulation/Gait assistance: Supervision Ambulation Distance (Feet): 60 Feet Assistive device: Rolling walker (2 wheeled) Gait Pattern/deviations: Step-through pattern Gait velocity: decreased   General Gait Details: VCs for positioning with use of RW   Stairs            Wheelchair Mobility    Modified Rankin (Stroke Patients Only)       Balance Overall balance assessment: Needs assistance Sitting-balance support: No  upper extremity supported Sitting balance-Leahy Scale: Good     Standing balance support: During functional activity Standing balance-Leahy Scale: Fair Standing balance comment: able to perform some activity without UE support today, some instability noted but no physical assist required                    Cognition Arousal/Alertness: Awake/alert Behavior During Therapy: WFL for tasks assessed/performed;Impulsive Overall Cognitive Status: Within Functional Limits for tasks assessed                 General Comments: Family reports that the pt is congnitively at her baseline. She was mildly impulsive during session, not following cues consistently for safety     Exercises      General Comments        Pertinent Vitals/Pain Pain Assessment: 0-10 Pain Score: 5  Pain Location: low back Pain Descriptors / Indicators: Aching Pain Intervention(s): Monitored during session;Heat applied    Home Living                      Prior Function            PT Goals (current goals can now be found in the care plan section) Acute Rehab PT Goals Patient Stated Goal: get back home PT Goal Formulation: With patient/family Time For Goal Achievement: 10/23/16 Potential to Achieve Goals: Good Progress towards PT goals: Progressing toward goals    Frequency    Min 3X/week      PT Plan Current plan remains appropriate    Co-evaluation             End of  Session Equipment Utilized During Treatment: Gait belt;Oxygen Activity Tolerance: Patient tolerated treatment well;Patient limited by fatigue (mild dizziness reported) Patient left: in chair;with call bell/phone within reach;with chair alarm set;with family/visitor present     Time: 1855-0158 PT Time Calculation (min) (ACUTE ONLY): 13 min  Charges:  $Gait Training: 8-22 mins                    G Codes:      Duncan Dull Oct 18, 2016, 5:33 PM Alben Deeds, [place] DPT  [phone]

## 2016-10-11 NOTE — Progress Notes (Signed)
Bianca Matthews with [facility] called for DME; oxygen, rolling walker and 3:1 to be delivered to the patient at the bedside today prior to discharging home.More oxygen tanks will be delivered to the patient's home at discharge. Mindi Slicker [facility] [phone]

## 2016-10-11 NOTE — Progress Notes (Signed)
Pt given DC instructions and pt verbalized understanding.  RN hooked pt up to home o2 and pt DC home via wheelchair.

## 2016-10-11 NOTE — Progress Notes (Signed)
Physical Therapy Treatment Patient Details Name: Bianca Matthews MRN: [id number] DOB: December 09, 1927 Today's Date: 10/11/2016    History of Present Illness 83 y.o.femalepresenting to ED following a fall. Patient is unsure what happened. She went into the garage in her one story townhouse, turned around, and the next thing she knew she was on the floor. Not dizzy, no prewarning. No LOC. Hit her head hard on the concrete, lots of dizziness, + concussion, nausea following the fall. Pt with resulting skull fx and concussion. Patient was being prepped for discharge when she starting having increasing shortness of breath. Patient was noted to have O2 sat of 84% and was placed on supple oxygen. PMH: HTN.    PT Comments    Suspected mild R posterior canal BPPV which seems to be resolving.  Mildly inconclusive testing as pt has had Meclizine today.  Pt is mobilizing better every day and with less assistance.  PT will continue to follow acutely, but she is safe to d/c home with her family's supervision.    Follow Up Recommendations  Home health PT;Supervision/Assistance - 24 hour (continued vestibular assessment off of Meclizine )     Equipment Recommendations  Rolling walker with 5" wheels    Recommendations for Other Services  (vestibular assessment)     Precautions / Restrictions Precautions Precautions: Fall Restrictions Weight Bearing Restrictions: No    Mobility  Bed Mobility Overal bed mobility: Needs Assistance Bed Mobility: Sidelying to Sit;Sit to Sidelying   Sidelying to sit: Min assist     Sit to sidelying: Min assist General bed mobility comments: Min assist to preform positions for BPPV side lying test  Transfers Overall transfer level: Needs assistance Equipment used: 1 person hand held assist Transfers: Sit to/from Stand Sit to Stand: Min guard         General transfer comment: Min guard assist for safety.    Ambulation/Gait Ambulation/Gait assistance:  Supervision Ambulation Distance (Feet): 20 Feet Assistive device: None Gait Pattern/deviations: Step-through pattern;Staggering left;Staggering right Gait velocity: decreased   General Gait Details: very mildly staggering gait pattern without RW for support.  Pt reaching for objects in room.  She is able to turn without LOB and move her head around during gait without LOB or reports of dizziness.           Balance Overall balance assessment: Needs assistance Sitting-balance support: Feet supported;No upper extremity supported Sitting balance-Leahy Scale: Good     Standing balance support: Single extremity supported;Bilateral upper extremity supported;No upper extremity supported Standing balance-Leahy Scale: Fair Standing balance comment: able to perform some activity without UE support today, some instability noted but no physical assist required                   10/11/16 1144  Vestibular Assessment  General Observation Pt with head trauma right posterior skull, no ringing, no fullness, no new hearing loss, no obvious asymmetry. h/o cataract surgery, no URI, but possibly allergies.  Dizziness with transitional movements up and down out of bed.    Symptom Behavior  Type of Dizziness "World moves"  Frequency of Dizziness in and out of bed  Duration of Dizziness very short  Aggravating Factors Lying supine;Supine to sit  Occulomotor Exam  Occulomotor Alignment Normal  Spontaneous Absent  Gaze-induced Absent (some very mild abnormal twitches, has had meclazine)  Smooth Pursuits Intact (some very mild abnormal twitches, has had meclazine)  Vestibulo-Occular Reflex  VOR 1 Head Only (x 1 viewing) intact, a tad slow both vertical  and horizontal, no increased symptoms  Auditory  Comments per pt gradual decline  Positional Testing  Sidelying Test Sidelying Right;Sidelying Left  Horizontal Canal Testing Horizontal Canal Right;Horizontal Canal Left  Sidelying Right   Sidelying Right Duration none, except <10 seconds when coming back to sitting EOB  Sidelying Right Symptoms No nystagmus  Sidelying Left  Sidelying Left Duration 0  Sidelying Left Symptoms No nystagmus  Horizontal Canal Right  Horizontal Canal Right Duration 0  Horizontal Canal Right Symptoms Normal  Horizontal Canal Left  Horizontal Canal Left Duration 0  Horizontal Canal Left Symptoms Normal  Positional Sensitivities  Sit to Supine 2 (per pt report)  Supine to Sitting 2 (<10 seconds during testing today)      Cognition Arousal/Alertness: Awake/alert Behavior During Therapy: WFL for tasks assessed/performed Overall Cognitive Status: Within Functional Limits for tasks assessed                 General Comments: Family reports that the pt is congnitively at her baseline. She was mildly impulsive during session, not following cues consistently for safety            Pertinent Vitals/Pain Pain Assessment: 0-10 Pain Score: 5  Pain Location: low back, some neck, mild head Pain Descriptors / Indicators: Aching Pain Intervention(s): Limited activity within patient's tolerance;Monitored during session;Repositioned           PT Goals (current goals can now be found in the care plan section) Acute Rehab PT Goals Patient Stated Goal: get back home PT Goal Formulation: With patient/family Time For Goal Achievement: 10/23/16 Potential to Achieve Goals: Good Progress towards PT goals: Progressing toward goals    Frequency    Min 3X/week      PT Plan Current plan remains appropriate       End of Session Equipment Utilized During Treatment: Gait belt Activity Tolerance: Patient limited by pain Patient left: in chair;with call bell/phone within reach;with chair alarm set     Time: 1100-1137 PT Time Calculation (min) (ACUTE ONLY): 37 min  Charges:   $Therapeutic Activity: 23-37 mins                      Paloma [place]ange B. [place], Lillie, DPT [phone]    10/11/2016, 6:17 PM

## 2016-10-12 ENCOUNTER — Telehealth: Payer: Self-pay | Admitting: Family Medicine

## 2016-10-12 NOTE — Telephone Encounter (Signed)
° °  Bianca Matthews with [facility] call to ask for orders for nursing and medical social worker    2 times a week foo 1 week starting 10/16/16 1 times a week from 3 weeks    Social worker 1 time for community resources she want to be DNR   Bianca Matthews 336 [phone]  Fax number

## 2016-10-13 ENCOUNTER — Telehealth: Payer: Self-pay

## 2016-10-13 NOTE — Telephone Encounter (Signed)
ATC pt but phone voicemail has not been set up. Unable to leave message. WCB

## 2016-10-13 NOTE — Telephone Encounter (Signed)
I spoke with [place] from [facility] home [facility] and per Dr. Sarajane Jews okay to give verbal orders, which I did.

## 2016-10-16 ENCOUNTER — Telehealth: Payer: Self-pay | Admitting: Family Medicine

## 2016-10-16 NOTE — Telephone Encounter (Signed)
I left a voice message for Santiago Glad with okay on verbal orders.

## 2016-10-16 NOTE — Telephone Encounter (Signed)
Bianca Matthews with [facility] home care would like verbal order for Home health nurse  2 wk / 1 1 wk / 5  Also for a  Medical social worker X 1 for community resources and code status.

## 2016-10-16 NOTE — Telephone Encounter (Signed)
Okay per Dr. Sarajane Jews.

## 2016-10-17 ENCOUNTER — Encounter: Payer: Self-pay | Admitting: Family Medicine

## 2016-10-17 ENCOUNTER — Ambulatory Visit (INDEPENDENT_AMBULATORY_CARE_PROVIDER_SITE_OTHER): Payer: Medicare PPO | Admitting: Family Medicine

## 2016-10-17 VITALS — BP 138/81 | HR 70 | Temp 97.8°F | Ht 64.0 in | Wt 197.0 lb

## 2016-10-17 DIAGNOSIS — R609 Edema, unspecified: Secondary | ICD-10-CM | POA: Diagnosis not present

## 2016-10-17 DIAGNOSIS — S060XAD Concussion with loss of consciousness status unknown, subsequent encounter: Secondary | ICD-10-CM

## 2016-10-17 DIAGNOSIS — I10 Essential (primary) hypertension: Secondary | ICD-10-CM | POA: Diagnosis not present

## 2016-10-17 DIAGNOSIS — I5031 Acute diastolic (congestive) heart failure: Secondary | ICD-10-CM

## 2016-10-17 DIAGNOSIS — S0291XD Unspecified fracture of skull, subsequent encounter for fracture with routine healing: Secondary | ICD-10-CM

## 2016-10-17 DIAGNOSIS — S060X9D Concussion with loss of consciousness of unspecified duration, subsequent encounter: Secondary | ICD-10-CM

## 2016-10-17 DIAGNOSIS — J189 Pneumonia, unspecified organism: Secondary | ICD-10-CM

## 2016-10-17 DIAGNOSIS — J181 Lobar pneumonia, unspecified organism: Secondary | ICD-10-CM

## 2016-10-17 LAB — BASIC METABOLIC PANEL
BUN: 20 mg/dL (ref 6–23)
CO2: 34 mEq/L — ABNORMAL HIGH (ref 19–32)
Calcium: 9.3 mg/dL (ref 8.4–10.5)
Chloride: 98 mEq/L (ref 96–112)
Creatinine, Ser: 0.77 mg/dL (ref 0.40–1.20)
GFR: 75.4 mL/min (ref >60.00)
Glucose, Bld: 100 mg/dL — ABNORMAL HIGH (ref 70–99)
Potassium: 4.1 mEq/L (ref 3.5–5.1)
Sodium: 139 mEq/L (ref 135–145)

## 2016-10-17 LAB — CBC WITH DIFFERENTIAL/PLATELET
Basophils Absolute: 0 10*3/uL (ref 0.0–0.1)
Basophils Relative: 0.3 % (ref 0.0–3.0)
Eosinophils Absolute: 0.2 10*3/uL (ref 0.0–0.7)
Eosinophils Relative: 2.2 % (ref 0.0–5.0)
HCT: 36 % (ref 36.0–46.0)
Hemoglobin: 11.9 g/dL — ABNORMAL LOW (ref 12.0–15.0)
Lymphocytes Relative: 18.4 % (ref 12.0–46.0)
Lymphs Abs: 1.4 10*3/uL (ref 0.7–4.0)
MCHC: 33.2 g/dL (ref 30.0–36.0)
MCV: 91.8 fl (ref 78.0–100.0)
Monocytes Absolute: 0.5 10*3/uL (ref 0.1–1.0)
Monocytes Relative: 6.9 % (ref 3.0–12.0)
Neutro Abs: 5.5 10*3/uL (ref 1.4–7.7)
Neutrophils Relative %: 72.2 % (ref 43.0–77.0)
Platelets: 274 10*3/uL (ref 150.0–400.0)
RBC: 3.93 Mil/uL (ref 3.87–5.11)
RDW: 15 % (ref 11.5–15.5)
WBC: 7.6 10*3/uL (ref 4.0–10.5)

## 2016-10-17 MED ORDER — POTASSIUM CHLORIDE CRYS ER 20 MEQ PO TBCR: 20.0000 meq | EXTENDED_RELEASE_TABLET | Freq: Two times a day (BID) | ORAL | 5 refills | Status: DC

## 2016-10-17 MED ORDER — FUROSEMIDE 40 MG PO TABS: 80.0000 mg | ORAL_TABLET | Freq: Two times a day (BID) | ORAL | 5 refills | Status: DC

## 2016-10-17 NOTE — Progress Notes (Signed)
   Subjective:    Patient ID: Bianca Matthews, female    DOB: Dec 16, 1927, 83 y.o.   MRN: [id number]  HPI Here to follow up a hospital stay from 10-08-16 to 01-09-80 for the complications from a fall. She feel backwards and struck her head on concrete. She had a non-displaced right occipital skull fracture and a concussion. No intracranial bleeding. She was also found to have diastolic CHF with a preserved EF on ECHO, and she had a right sided pneumonia. She was diuresed and sent home on Lasix 40 mg TID. The pneumonia was treated with Levaquin. She was sent home with supplemental oxygen. She is here today with her daughter who is staying with her for now. She feels good today, no headache or dizziness. No SOB or chest pain or cough. Her feet and legs stay quite swollen however. She is wearing compression stockings.    Review of Systems  Constitutional: Negative.   Respiratory: Negative.   Cardiovascular: Positive for leg swelling. Negative for chest pain and palpitations.  Gastrointestinal: Negative.   Neurological: Negative.        Objective:   Physical Exam  Constitutional: She is oriented to person, place, and time. She appears well-developed and well-nourished.  Wearing Candor oxygen at 2 liters   Neck: Normal range of motion. Neck supple. No thyromegaly present.  Cardiovascular: Normal rate, regular rhythm, normal heart sounds and intact distal pulses.   No murmur heard. Pulmonary/Chest: Effort normal and breath sounds normal. No respiratory distress. She has no wheezes. She has no rales.  Musculoskeletal:  4+ edema in both lower legs and feet   Lymphadenopathy:    She has no cervical adenopathy.  Neurological: She is alert and oriented to person, place, and time. No cranial nerve deficit. She exhibits normal muscle tone. Coordination normal.          Assessment & Plan:  She is recovering from a skull fracture and concussion, and this seems to be going quite well. During this episode  her diastolic CHF worsened and she still has significant LE edema. We will increase the Lasix to 80 mg bid and increase the potassium to 20 mEq bid. Check a BMET today. She is scheduled to see Dr. Gwenlyn Found on 10-27-16 for a Cardiology follow up. She is recovering from a pneumonia and she will take the last 2 days of Levaquin. She is having home visits twice a week from [facility] and she is getting home PT. I asked to see her back in one month.  Alysia Penna, MD

## 2016-10-17 NOTE — Progress Notes (Signed)
Pre visit review using our clinic review tool, if applicable. No additional management support is needed unless otherwise documented below in the visit note. 

## 2016-10-23 NOTE — Telephone Encounter (Signed)
ATC, n/a and no vm. Unable to leave message. WCB

## 2016-10-24 NOTE — Telephone Encounter (Signed)
ATC pt, but n/a and no vm. Unable to leave message or reach pt. Closing message per protocol.

## 2016-10-26 ENCOUNTER — Telehealth: Payer: Self-pay | Admitting: Family Medicine

## 2016-10-26 NOTE — Telephone Encounter (Signed)
° ° ° °  Deana with [facility] call to ask if a humidifier could be called in for [facility]. She said pt is on oxgyen and her nose is getting very dry.   [phone] [phone]

## 2016-10-26 NOTE — Telephone Encounter (Signed)
Please order a humidifier as noted

## 2016-10-27 ENCOUNTER — Encounter: Payer: Self-pay | Admitting: Cardiovascular Disease

## 2016-10-27 ENCOUNTER — Ambulatory Visit (INDEPENDENT_AMBULATORY_CARE_PROVIDER_SITE_OTHER): Payer: Medicare PPO | Admitting: Cardiovascular Disease

## 2016-10-27 DIAGNOSIS — E7800 Pure hypercholesterolemia, unspecified: Secondary | ICD-10-CM

## 2016-10-27 DIAGNOSIS — R55 Syncope and collapse: Secondary | ICD-10-CM

## 2016-10-27 DIAGNOSIS — I10 Essential (primary) hypertension: Secondary | ICD-10-CM

## 2016-10-27 DIAGNOSIS — I5032 Chronic diastolic (congestive) heart failure: Secondary | ICD-10-CM | POA: Diagnosis not present

## 2016-10-27 NOTE — Patient Instructions (Signed)
Medication Instructions: Your physician recommends that you continue on your current medications as directed. Please refer to the Current Medication list given to you today.   Follow-Up: We request that you follow-up in: 3 months with an extender and in 6 months with Dr Andria Rhein will receive a reminder letter in the mail two months in advance. If you don't receive a letter, please call our office to schedule the follow-up appointment.  If you need a refill on your cardiac medications before your next appointment, please call your pharmacy.

## 2016-10-27 NOTE — Assessment & Plan Note (Signed)
History of hyperlipidemia on statin therapy lipid profile performed 12/02/15 revealed total cholesterol 196, LDL 105 and HDL of 62.

## 2016-10-27 NOTE — Assessment & Plan Note (Signed)
Bianca Matthews had a recent episode of syncope requiring hospitalization 10/08/16. This was witnessed by her family. She had a contusion on her skull. She does not remember falling. Etiology of her syncopal episode is still unclear. I talked about driving restriction for 6 months. If she has a recurrent episode we will need to put an event monitor on her.

## 2016-10-27 NOTE — Progress Notes (Signed)
10/27/2016 Bianca Matthews   12/26/1927  [id number]  Primary Physician Bianca Penna, MD Primary Cardiologist: Bianca Harp MD Bianca Matthews  HPI:  Bianca Matthews is an 83 year old mildly overweight widowed Caucasian female mother of 3 children, grandmother and 5 grandchildren was accompanied by her daughter Bianca Matthews & [name]. She is originally from [place] and moved down here 16 years ago. Her primary care provider is Bianca Matthews. She has no prior cardiac history. She does have a history of hypertension. She had a syncopal episode on 10/08/16 which was witnessed by family member. This was probably by a skull fracture and concussion. In addition, she had community-acquired pneumonia and grade 2 diastolic heart failure. A 2-D echocardiogram performed 10/10/16 revealed normal LV systolic function with grade 2 diastolic dysfunction. She is currently on diuretic. She lives alone. I have discussed the possibility of assisted care facility on the patient currently values her independence.   Current Outpatient Prescriptions  Medication Sig Dispense Refill  . acetaminophen (TYLENOL) 325 MG tablet Take 2 tablets (650 mg total) by mouth every 4 (four) hours as needed for headache or mild pain. 30 tablet 0  . Ascorbic Acid (VITAMIN C PO) Take 1 tablet by mouth daily.    Marland Kitchen aspirin 325 MG tablet Take 325 mg by mouth every evening. 4pm    . atenolol (TENORMIN) 100 MG tablet Take 100 mg by mouth 2 (two) times daily.    Marland Kitchen co-enzyme Q-10 30 MG capsule Take 30 mg by mouth daily.    . furosemide (LASIX) 40 MG tablet Take 2 tablets (80 mg total) by mouth 2 (two) times daily. 120 tablet 5  . guaiFENesin (MUCINEX) 600 MG 12 hr tablet Take 2 tablets (1,200 mg total) by mouth 2 (two) times daily. 60 tablet 0  . guaiFENesin-dextromethorphan (ROBITUSSIN DM) 100-10 MG/5ML syrup Take 5 mLs by mouth every 4 (four) hours as needed for cough. 118 mL 0  . levofloxacin (LEVAQUIN) 750 MG tablet Take 1 tablet (750  mg total) by mouth every other day. 5 tablet 0  . losartan (COZAAR) 100 MG tablet Take 1 tablet (100 mg total) by mouth daily. 30 tablet 4  . metoprolol succinate (TOPROL-XL) 100 MG 24 hr tablet Take 1 tablet (100 mg total) by mouth daily. Take with or immediately following a meal. Start after atenolol is finished. 90 tablet 3  . Multiple Vitamin (MULTIVITAMIN WITH MINERALS) TABS tablet Take 1 tablet by mouth daily. Women's One a Day    . omeprazole (PRILOSEC) 40 MG capsule TAKE 1 CAPSULE EVERY DAY 90 capsule 3  . potassium chloride SA (K-DUR,KLOR-CON) 20 MEQ tablet Take 1 tablet (20 mEq total) by mouth 2 (two) times daily. 60 tablet 5  . PRESCRIPTION MEDICATION Inject into the skin every Monday. Weekly allergy injections done at Dr. Renne Matthews office    . Probiotic Product (PROBIOTIC FORMULA PO) Take 1 tablet by mouth daily.     . rosuvastatin (CRESTOR) 10 MG tablet Take 1 tablet (10 mg total) by mouth at bedtime. (Patient taking differently: Take 10 mg by mouth every evening. 4pm) 90 tablet 3   No current facility-administered medications for this visit.     No Known Allergies  Social History   Social History  . Marital status: Married    Spouse name: N/A  . Number of children: N/A  . Years of education: N/A   Occupational History  . retired    Social History Main Topics  .  Smoking status: Former Smoker    Packs/day: 0.50    Years: 10.00  . Smokeless tobacco: Never Used  . Alcohol use 0.0 oz/week     Comment: occ  . Drug use: No  . Sexual activity: Not on file   Other Topics Concern  . Not on file   Social History Narrative   Retired   Married           Review of Systems: General: negative for chills, fever, night sweats or weight changes.  Cardiovascular: negative for chest pain, dyspnea on exertion, edema, orthopnea, palpitations, paroxysmal nocturnal dyspnea or shortness of breath Dermatological: negative for rash Respiratory: negative for cough or  wheezing Urologic: negative for hematuria Abdominal: negative for nausea, vomiting, diarrhea, bright red blood per rectum, melena, or hematemesis Neurologic: negative for visual changes, syncope, or dizziness All other systems reviewed and are otherwise negative except as noted above.    Blood pressure (!) 118/54, pulse 68, height '5\' 4"'$  (1.626 m), weight 195 lb 9.6 oz (88.7 kg).  General appearance: alert and no distress Neck: no adenopathy, no carotid bruit, no JVD, supple, symmetrical, trachea midline and thyroid not enlarged, symmetric, no tenderness/mass/nodules Lungs: clear to auscultation bilaterally Heart: regular rate and rhythm, S1, S2 normal, no murmur, click, rub or gallop Extremities: 2+ pitting edema bilaterally  EKG not performed today  ASSESSMENT AND PLAN:   Essential hypertension History of hypertension with blood pressure measures 118/54. She is on losartan and atenolol. Continue current meds at current dosing  Hyperlipidemia History of hyperlipidemia on statin therapy lipid profile performed 12/02/15 revealed total cholesterol 196, LDL 105 and HDL of 62.  CHF (congestive heart failure) (HCC) History of diastolic heart failure with lower extremity swelling and oxygen dependence. Recent 2-D echo performed 10/10/16 showed normal LV systolic function with grade 2 diastolic dysfunction and normal valvular function. She does have 2+ pitting edema. She is wearing compression stockings. Talked about fluid and salt restriction.  Syncope Bianca Matthews had a recent episode of syncope requiring hospitalization 10/08/16. This was witnessed by her family. She had a contusion on her skull. She does not remember falling. Etiology of her syncopal episode is still unclear. I talked about driving restriction for 6 months. If she has a recurrent episode we will need to put an event monitor on her.      Bianca Harp MD FACP,FACC,FAHA, [facility] 10/27/2016 2:53 PM

## 2016-10-27 NOTE — Assessment & Plan Note (Signed)
History of diastolic heart failure with lower extremity swelling and oxygen dependence. Recent 2-D echo performed 10/10/16 showed normal LV systolic function with grade 2 diastolic dysfunction and normal valvular function. She does have 2+ pitting edema. She is wearing compression stockings. Talked about fluid and salt restriction.

## 2016-10-27 NOTE — Telephone Encounter (Signed)
I spoke with Deanna and gave verbal order for humidifier.

## 2016-10-27 NOTE — Assessment & Plan Note (Signed)
History of hypertension with blood pressure measures 118/54. She is on losartan and atenolol. Continue current meds at current dosing

## 2016-10-31 ENCOUNTER — Ambulatory Visit (INDEPENDENT_AMBULATORY_CARE_PROVIDER_SITE_OTHER): Payer: Medicare PPO | Admitting: Family Medicine

## 2016-10-31 ENCOUNTER — Encounter: Payer: Self-pay | Admitting: Family Medicine

## 2016-10-31 VITALS — BP 154/73 | HR 65 | Temp 97.6°F | Ht 64.0 in | Wt 191.0 lb

## 2016-10-31 DIAGNOSIS — I5030 Unspecified diastolic (congestive) heart failure: Secondary | ICD-10-CM | POA: Diagnosis not present

## 2016-10-31 DIAGNOSIS — I10 Essential (primary) hypertension: Secondary | ICD-10-CM | POA: Diagnosis not present

## 2016-10-31 DIAGNOSIS — R0902 Hypoxemia: Secondary | ICD-10-CM | POA: Diagnosis not present

## 2016-10-31 DIAGNOSIS — R609 Edema, unspecified: Secondary | ICD-10-CM

## 2016-10-31 MED ORDER — LOSARTAN POTASSIUM 100 MG PO TABS
100.0000 mg | ORAL_TABLET | Freq: Every day | ORAL | 3 refills | Status: DC
Start: 2016-10-31 — End: 2017-05-08

## 2016-10-31 MED ORDER — LOSARTAN POTASSIUM 100 MG PO TABS: 100.0000 mg | ORAL_TABLET | Freq: Every day | ORAL | 0 refills | Status: DC

## 2016-10-31 NOTE — Progress Notes (Signed)
   Subjective:    Patient ID: Bianca Matthews, female    DOB: 11-Jun-1928, 83 y.o.   MRN: [id number]  HPI Here with her son to ask a few questions. She was recently hospitalized briefly for a concussion after a fall that was probably syncopal. She apparently passed out in her garage and then fell backwards onto the floor. Her son now says that his wife describes Zairah looking upward and holding her garage door opener up high in the air attempting to program the opener just before she passed out. In retrospect this sounds to me like an event of orthostatic hypotension. Since then she has been fine from a cardiac standpoint. There have been no more syncopal events. She saw Dr. Gwenlyn Found a few days ago and he agrees that she is doing well on her current meds. The family asks if she really needs to wear her oxygen, since this has been a real burden to her. She has taken it off a few times to walk to her mailbox and she has felt fine.    Review of Systems  Constitutional: Negative.   Respiratory: Negative.   Cardiovascular: Negative.   Neurological: Negative.        Objective:   Physical Exam  Constitutional: She is oriented to person, place, and time. She appears well-developed and well-nourished.  Cardiovascular: Normal rate, regular rhythm, normal heart sounds and intact distal pulses.   Pulmonary/Chest: Effort normal and breath sounds normal.  We took off the oxygen and had her sit in the exam room for 15 minutes. Then a resting Pox was 85-88%. We had her walk down the hallway and back, and a repeat Pox dropped to 75%.  Musculoskeletal: She exhibits no edema.  Neurological: She is alert and oriented to person, place, and time.          Assessment & Plan:  Her BP is stable and her cardiac status is stable. No more syncopal episodes. We will keep her on her current medications. She needs to stay on oxygen for now,at least whenever she is moving around and when she is sleeping. She can take it  off wen she is sitting still, such as reading or watching TV. We do not ave a good explanation for this hypoxia, so we will refer her to Pulmonary to evaluate further.  Alysia Penna, MD

## 2016-10-31 NOTE — Progress Notes (Signed)
Pre visit review using our clinic review tool, if applicable. No additional management support is needed unless otherwise documented below in the visit note. 

## 2016-11-03 ENCOUNTER — Other Ambulatory Visit (INDEPENDENT_AMBULATORY_CARE_PROVIDER_SITE_OTHER): Payer: Medicare PPO

## 2016-11-03 ENCOUNTER — Encounter: Payer: Self-pay | Admitting: Internal Medicine

## 2016-11-03 ENCOUNTER — Ambulatory Visit (INDEPENDENT_AMBULATORY_CARE_PROVIDER_SITE_OTHER)
Admission: RE | Admit: 2016-11-03 | Discharge: 2016-11-03 | Disposition: A | Discharge status: Home / Self Care | Payer: Medicare PPO | Source: Ambulatory Visit | Attending: Internal Medicine | Admitting: Internal Medicine

## 2016-11-03 ENCOUNTER — Ambulatory Visit (INDEPENDENT_AMBULATORY_CARE_PROVIDER_SITE_OTHER): Payer: Medicare PPO | Admitting: Internal Medicine

## 2016-11-03 VITALS — BP 118/64 | HR 60 | Ht 64.0 in | Wt 194.6 lb

## 2016-11-03 DIAGNOSIS — J9612 Chronic respiratory failure with hypercapnia: Secondary | ICD-10-CM | POA: Diagnosis not present

## 2016-11-03 DIAGNOSIS — J9611 Chronic respiratory failure with hypoxia: Secondary | ICD-10-CM

## 2016-11-03 DIAGNOSIS — R0602 Shortness of breath: Secondary | ICD-10-CM

## 2016-11-03 LAB — CBC WITH DIFFERENTIAL/PLATELET
Basophils Absolute: 0 10*3/uL (ref 0.0–0.1)
Basophils Relative: 0.8 % (ref 0.0–3.0)
Eosinophils Absolute: 0.2 10*3/uL (ref 0.0–0.7)
Eosinophils Relative: 3.8 % (ref 0.0–5.0)
HCT: 38 % (ref 36.0–46.0)
Hemoglobin: 12.6 g/dL (ref 12.0–15.0)
Lymphocytes Relative: 24.6 % (ref 12.0–46.0)
Lymphs Abs: 1.4 10*3/uL (ref 0.7–4.0)
MCHC: 33.2 g/dL (ref 30.0–36.0)
MCV: 92.6 fl (ref 78.0–100.0)
Monocytes Absolute: 0.5 10*3/uL (ref 0.1–1.0)
Monocytes Relative: 7.9 % (ref 3.0–12.0)
Neutro Abs: 3.7 10*3/uL (ref 1.4–7.7)
Neutrophils Relative %: 62.9 % (ref 43.0–77.0)
Platelets: 247 10*3/uL (ref 150.0–400.0)
RBC: 4.1 Mil/uL (ref 3.87–5.11)
RDW: 14.8 % (ref 11.5–15.5)
WBC: 5.8 10*3/uL (ref 4.0–10.5)

## 2016-11-03 LAB — BASIC METABOLIC PANEL
BUN: 19 mg/dL (ref 6–23)
CO2: 35 mEq/L — ABNORMAL HIGH (ref 19–32)
Calcium: 9.7 mg/dL (ref 8.4–10.5)
Chloride: 97 mEq/L (ref 96–112)
Creatinine, Ser: 0.8 mg/dL (ref 0.40–1.20)
GFR: 72.14 mL/min (ref >60.00)
Glucose, Bld: 113 mg/dL — ABNORMAL HIGH (ref 70–99)
Potassium: 3.8 mEq/L (ref 3.5–5.1)
Sodium: 139 mEq/L (ref 135–145)

## 2016-11-03 LAB — BRAIN NATRIURETIC PEPTIDE: Pro B Natriuretic peptide (BNP): 239 pg/mL — ABNORMAL HIGH (ref 0.0–100.0)

## 2016-11-03 LAB — TSH: TSH: 2.98 u[IU]/mL (ref 0.35–4.50)

## 2016-11-03 MED ORDER — SPIRONOLACTONE 25 MG PO TABS: 25.0000 mg | ORAL_TABLET | Freq: Two times a day (BID) | ORAL | 2 refills | Status: DC

## 2016-11-03 NOTE — Patient Instructions (Addendum)
Please remember to go to the lab and x-ray department downstairs in the basement  for your tests - we will call you with the results when they are available.  Goal with 02 is to keep  over sat 90% at all times  Continue 02 2lpm 24/7 but ok to turn up if needed to maintain over 90%     Aldactone 25 mg twice daily   Please schedule a follow up office visit in  2 weeks, sooner if needed with all active medications in hand

## 2016-11-03 NOTE — Progress Notes (Signed)
Spoke with pt and notified of results per Dr. Wert. Pt verbalized understanding and denied any questions. 

## 2016-11-03 NOTE — Progress Notes (Signed)
Subjective:    Patient ID: Bianca Matthews, female    DOB: 1928/08/20,     MRN: [id number]  HPI  45 yowf from the [place] quit smoking 1970s with problems with upper resp  allergies in [place] prior to arrival in in [place] in 2002 and worse ever since here but never dx of copd/asthma then admit and on 02 since:     Admit date: 10/08/2016 Discharge date: 10/11/2016  Primary Care Physician:  Alysia Penna, MD  Discharge Diagnoses:    . Skull fracture with concussion ([place])   Acute hypoxic respiratory failure . acute Grade 2 diastolic CHF . Hypokalemia . Mechanical fall . Essential hypertension   Community-acquired pneumonia right lung   Consults:  None  Recommendations for Outpatient Follow-up:  1. DME rolling walker, 3 and 1 arranged by case management 2. Home O2 evaluation done, 3 L with exertion to be arranged by case management 3. Please repeat CBC/BMET at next visit 4. Referral has been sent to [facility] cardiology office, patient will be scheduled for appointment (i sent email message to Trish to schedule appointment)  Patient has been given the prescription of Toprol-XL by her PCP but she wants to finish the prescription of atenolol at home before she starts the Toprol-XL.   DIET: Heart healthy diet    Allergies:  No Known Allergies   DISCHARGE MEDICATIONS:     Current Discharge Medication List        START taking these medications   Details  acetaminophen (TYLENOL) 325 MG tablet Take 2 tablets (650 mg total) by mouth every 4 (four) hours as needed for headache or mild pain. Qty: 30 tablet, Refills: 0    guaiFENesin (MUCINEX) 600 MG 12 hr tablet Take 2 tablets (1,200 mg total) by mouth 2 (two) times daily. Qty: 60 tablet, Refills: 0    guaiFENesin-dextromethorphan (ROBITUSSIN DM) 100-10 MG/5ML syrup Take 5 mLs by mouth every 4 (four) hours as needed for cough. Qty: 118 mL, Refills: 0    levofloxacin (LEVAQUIN) 750 MG tablet Take 1 tablet (750 mg  total) by mouth every other day. Qty: 5 tablet, Refills: 0    losartan (COZAAR) 100 MG tablet Take 1 tablet (100 mg total) by mouth daily. Qty: 30 tablet, Refills: 4    meclizine (ANTIVERT) 12.5 MG tablet Take 1 tablet (12.5 mg total) by mouth 3 (three) times daily as needed for dizziness. Also available over the counter Qty: 90 tablet, Refills: 2    ondansetron (ZOFRAN ODT) 4 MG disintegrating tablet Take 1 tablet (4 mg total) by mouth every 8 (eight) hours as needed for nausea or vomiting. Qty: 20 tablet, Refills: 0          CONTINUE these medications which have CHANGED   Details  metoprolol succinate (TOPROL-XL) 100 MG 24 hr tablet Take 1 tablet (100 mg total) by mouth daily. Take with or immediately following a meal. Start after atenolol is finished. Qty: 90 tablet, Refills: 3          CONTINUE these medications which have NOT CHANGED   Details  Ascorbic Acid (VITAMIN C PO) Take 1 tablet by mouth daily.    aspirin 325 MG tablet Take 325 mg by mouth every evening. 4pm    atenolol (TENORMIN) 100 MG tablet Take 100 mg by mouth 2 (two) times daily.    co-enzyme Q-10 30 MG capsule Take 30 mg by mouth daily.    furosemide (LASIX) 40 MG tablet Take 1 tablet (40 mg total)  by mouth 3 (three) times daily. Qty: 270 tablet, Refills: 3    Multiple Vitamin (MULTIVITAMIN WITH MINERALS) TABS tablet Take 1 tablet by mouth daily. Women's One a Day    omeprazole (PRILOSEC) 40 MG capsule TAKE 1 CAPSULE EVERY DAY Qty: 90 capsule, Refills: 3    potassium chloride SA (K-DUR,KLOR-CON) 20 MEQ tablet TAKE 1 TABLET EVERY DAY Qty: 90 tablet, Refills: 3    PRESCRIPTION MEDICATION Inject into the skin every Monday. Weekly allergy injections done at Dr. Renne Musca office    Probiotic Product (PROBIOTIC FORMULA PO) Take 1 tablet by mouth daily.     rosuvastatin (CRESTOR) 10 MG tablet Take 1 tablet (10 mg total) by mouth at bedtime. Qty: 90 tablet, Refills: 3         STOP  taking these medications     losartan-hydrochlorothiazide (HYZAAR) 100-12.5 MG tablet          Brief H and P: For complete details please refer to admission H and P, but in brief Patient is a 83 year old female with hypertension, hyperlipidemia, GERD presented after a mechanical fall. She hit her head hard on the concrete with subsequent dizziness, nausea and concussion after the fall. In the ER, patient was noted to be in mild respiratory distress with hypoxia and she reported having episodes of shortness of breath, dyspnea on exertion. She was diagnosed with bronchitis on 10/12 and finished a course of a Zithromax.  CT head and cervical spine was significant for a nondisplaced and non-depressed right parietal toe occipital skull fracture. No head bleed or cervical fracture.  Patient was noted to have O2 sats of 84% in ED and was placed on supplemental O2. Patient was admitted for fall/head injury, concussion and acute hypoxic respiratory failure.   Hospital Course:  Skull fracture with concussion (HCC)With dizziness - CT head and cervical spine was significant for a nondisplaced and non-depressed right parietal toe occipital skull fracture. No head bleed or cervical fracture.  - Patient had significant dizziness on getting up, possibly had syncopal episode that led to the fall however patient could not recall the events. Lives alone - PTOT consult recommended home health PT - Initially placed on meclizine scheduled due to significant dizziness, Zofran as needed.  - PTOT evaluation recommended 24 7 supervision. Patient feels a lot improved today. I spoke with Dr. Kary Kos, neurosurgery who did not feel patient needed any neurosurgical intervention as this is expected post concussive symptoms. However patient can see him in the office as needed if she has long protracted course of her symptoms.  Acute hypoxic respiratory failure, community acquired pneumonia - BNP was elevated at  296 with chest x-ray showing right lung opacity may reflect atelectasis or possibility of pneumonia. Cardiomegaly but no convincing pulmonary edema. - Patient received Lasix in ED. Patient reporting significant cough and productive phlegm, placed on Levaquin, Mucinex, Robitussin  Acute diastolic CHF - Per patient's family, she has been taking Lasix for a long time for peripheral edema however she did not have any prior echo. She was being worked up for CHF outpatient. 2-D echo was done which showed preserved EF however has grade 2 diastolic dysfunction. - She received IV Lasix in patient and will continue outpatient dose of Lasix '40mg'$  TID at discharge.   Mechanical fall - PTOT consult recommended home health PT  Hypokalemia - Resolved with supplementation  Essential hypertension - Currently stable, continue Tenormin (changing to metoprolol once she runs out of her current supply) -Discontinued hydrochlorothiazide and Lasix  dosing (concern for renal stress)  Hyperglycemia - No listed history of diabetes mellitus, will check hemoglobin A1c    11/03/2016 1st [facility] office visit/ [facility]   Chief Complaint  Patient presents with  . Pulmonary Consult    Referred by Dr. Hulan Fess, Pt. here today c/o increase sob with exertion, Pt.  here today to discuss if she needs to be on oxygen as needed or 24/7   doe since d/c = MMRC3 = can't walk 100 yards even at a slow pace at a flat grade s stopping due to sob  Even on 02  Leg swelling x years/ no better on lasix  Breathing was not really limiting her prior to her admit but she was becoming much more sedentary per her son pta  No obvious day to day or daytime variability or assoc excess/ purulent sputum or mucus plugs or hemoptysis or cp or chest tightness, subjective wheeze or overt   hb symptoms. No unusual exp hx or h/o childhood pna/ asthma or knowledge of premature birth.  Sleeping ok without nocturnal  or early am  exacerbation  of respiratory  c/o's or need for noct saba. Also denies any obvious fluctuation of symptoms with weather or environmental changes or other aggravating or alleviating factors except as outlined above   Current Medications, Allergies, Complete Past Medical History, Past Surgical History, Family History, and Social History were reviewed in [employer] record.      Review of Systems  Constitutional: Negative.  Negative for fever and unexpected weight change.  HENT: Positive for congestion, postnasal drip, sinus pressure and sneezing. Negative for dental problem, ear pain, nosebleeds, rhinorrhea, sore throat and trouble swallowing.   Eyes: Negative.  Negative for redness and itching.  Respiratory: Positive for shortness of breath. Negative for cough, chest tightness and wheezing.   Cardiovascular: Negative.  Negative for palpitations and leg swelling.  Gastrointestinal: Negative.  Negative for nausea and vomiting.  Endocrine: Negative.   Genitourinary: Negative.  Negative for dysuria.  Musculoskeletal: Positive for joint swelling.  Skin: Negative.  Negative for rash.  Allergic/Immunologic: Positive for environmental allergies.  Neurological: Negative.  Negative for headaches.  Hematological: Negative.  Does not bruise/bleed easily.  Psychiatric/Behavioral: Negative.  Negative for dysphoric mood. The patient is not nervous/anxious.        Objective:   Physical Exam  amb stoic wf lets her son do the talking   Wt Readings from Last 3 Encounters:  11/03/16 194 lb 9.6 oz (88.3 kg)  10/31/16 191 lb (86.6 kg)  10/27/16 195 lb 9.6 oz (88.7 kg)    Vital signs reviewed - Note on arrival 02 sats  96% on 2lpm      HEENT: nl dentition, turbinates, and oropharynx. Nl external ear canals without cough reflex   NECK :  without JVD/Nodes/TM/ nl carotid upstrokes bilaterally   LUNGS: no acc muscle use,  Nl contour chest with moderately distant bs bilaterally  but no wheeze    CV:  RRR  no s3 or murmur or increase in P2,  And  2+ ptting  lower ext sym  Bilaterally   ABD:  soft and nontender with nl inspiratory excursion in the supine position. No bruits or organomegaly appreciated, bowel sounds nl  MS:  Nl gait/ ext warm without deformities, calf tenderness, cyanosis or clubbing No obvious joint restrictions   SKIN: warm and dry without lesions    NEURO:  alert, approp, nl sensorium with  no motor or cerebellar  deficits apparent.     CXR PA and Lateral:   11/03/2016 :    I personally reviewed images and agree with radiology impression as follows:   1.  Low lung volumes with bibasilar atelectasis. 2. Cardiomegaly.  No pulmonary venous congestion .    Labs ordered/ reviewed:      Chemistry      Component Value Date/Time   NA 139 11/03/2016 1302   K 3.8 11/03/2016 1302   CL 97 11/03/2016 1302   CO2 35 (H) 11/03/2016 1302   BUN 19 11/03/2016 1302   CREATININE 0.80 11/03/2016 1302      Component Value Date/Time   CALCIUM 9.7 11/03/2016 1302   ALKPHOS 48 12/02/2015 0938   AST 18 12/02/2015 0938   ALT 17 12/02/2015 0938   BILITOT 0.7 12/02/2015 0938        Lab Results  Component Value Date   WBC 5.8 11/03/2016   HGB 12.6 11/03/2016   HCT 38.0 11/03/2016   MCV 92.6 11/03/2016   PLT 247.0 11/03/2016     Lab Results  Component Value Date   DDIMER 0.61 (H) 11/03/2016      Lab Results  Component Value Date   TSH 2.98 11/03/2016     Lab Results  Component Value Date   PROBNP 239.0 (H) 11/03/2016               Assessment & Plan:

## 2016-11-04 ENCOUNTER — Encounter: Payer: Self-pay | Admitting: Internal Medicine

## 2016-11-04 DIAGNOSIS — J9611 Chronic respiratory failure with hypoxia: Secondary | ICD-10-CM | POA: Insufficient documentation

## 2016-11-04 DIAGNOSIS — J9612 Chronic respiratory failure with hypercapnia: Secondary | ICD-10-CM

## 2016-11-04 LAB — D-DIMER, QUANTITATIVE: D-Dimer, Quant: 0.61 mcg/mL FEU — ABNORMAL HIGH (ref <0.50)

## 2016-11-04 NOTE — Assessment & Plan Note (Addendum)
Left ventricle: The cavity size was normal. There was moderate   concentric hypertrophy. Systolic function was normal. The   estimated ejection fraction was in the range of 60% to 65%. Wall   motion was normal; there were no regional wall motion   abnormalities. Features are consistent with a pseudonormal left   ventricular filling pattern, with concomitant abnormal relaxation   and increased filling pressure (grade 2 diastolic dysfunction).   Doppler parameters are consistent with high ventricular filling   pressure. - Aortic valve: There was very mild stenosis. Valve area (VTI):   1.72 cm^2. Valve area (Vmax): 1.74 cm^2. Valve area (Vmean): 1.81   cm^2. - Mitral valve: There was mild regurgitation. - Atrial septum: There was increased thickness of the septum,   consistent with lipomatous hypertrophy. - Pulmonary arteries: PA peak pressure: 33 mm Hg (S). Spirometry 11/03/2016  Purely restrictive    Clearly this is related to obesity and diastolic dysfunction and not to her "allergies" or h/o smoking and first step is to establish a dry wt to see to what extent her breathing improves.  I sense a lot of confusion between pt and son re med administration so will start with asking her to return with all meds in hand using a trust but verify approach to confirm accurate Medication  Reconciliation The principal here is that until we are certain that the  patients are doing what we've asked, it makes no sense to ask them to do more.   For now add low doses of aldactone which will offset tendency of diuretics to cause met alkalosis and hypokalemia  Total time devoted to counseling  > 50 % of initial 60 min office visit:  review case with pt/son including extensive inpt records and  discussion of options/alternatives/ personally creating written customized instructions  in presence of pt  then going over those specific  Instructions directly with the pt including how to use all of the meds but in  particular covering each new medication in detail and the difference between the maintenance= "automatic" meds and the prns using an action plan format for the latter (If this problem/symptom => do that organization reading Left to right).  Please see AVS from this visit for a full list of these instructions which I personally wrote for this pt and  are unique to this visit.

## 2016-11-04 NOTE — Assessment & Plan Note (Addendum)
Complicated by hypercarbic resp failure and diastolic dysfunction   Body mass index is 33.4   Lab Results  Component Value Date   TSH 2.98 11/03/2016     Contributing to gerd risk/ doe/reviewed the need and the process to achieve and maintain neg calorie balance > defer f/u primary care including intermittently monitoring thyroid status

## 2016-11-04 NOTE — Assessment & Plan Note (Signed)
On 02 since d/c 10/11/16 - HCO3  11/03/2016  = 35   Hypercarbia likely from OHS and diuresis related met alkalosis and not copd so rec use aldactone for further diuretics and titrate 02 to low 90s / advised

## 2016-11-06 ENCOUNTER — Telehealth: Payer: Self-pay | Admitting: Internal Medicine

## 2016-11-06 NOTE — Telephone Encounter (Signed)
Pt son returning call.Stanley A Dalton  call.Hillery Hunter

## 2016-11-06 NOTE — Progress Notes (Signed)
LMTCB

## 2016-11-06 NOTE — Progress Notes (Signed)
Pt's son notified

## 2016-11-06 NOTE — Telephone Encounter (Signed)
Tanda Rockers, MD sent to Rosana Berger, CMA        Call patient : Study is unremarkable, no change in recs (Nl adjusted for age)   59 with pt's son and notified of results per Dr. Melvyn Novas.

## 2016-11-08 ENCOUNTER — Telehealth: Payer: Self-pay | Admitting: Family Medicine

## 2016-11-08 NOTE — Telephone Encounter (Signed)
Pt would like a call back to discuss the med losartan (COZAAR) 100 MG tablet  Pt not sure if Dr Sarajane Jews wants her on this med or not.  Pt would like a call back asap.

## 2016-11-09 ENCOUNTER — Encounter: Payer: Self-pay | Admitting: Podiatry

## 2016-11-09 ENCOUNTER — Ambulatory Visit (INDEPENDENT_AMBULATORY_CARE_PROVIDER_SITE_OTHER): Payer: Medicare PPO | Admitting: Podiatry

## 2016-11-09 ENCOUNTER — Ambulatory Visit: Payer: Medicare PPO | Admitting: Podiatry

## 2016-11-09 VITALS — Ht 64.0 in | Wt 194.0 lb

## 2016-11-09 DIAGNOSIS — B351 Tinea unguium: Secondary | ICD-10-CM | POA: Diagnosis not present

## 2016-11-09 DIAGNOSIS — E1149 Type 2 diabetes mellitus with other diabetic neurological complication: Secondary | ICD-10-CM

## 2016-11-09 DIAGNOSIS — M79676 Pain in unspecified toe(s): Secondary | ICD-10-CM

## 2016-11-09 NOTE — Progress Notes (Signed)
Patient ID: Bianca Matthews, female   DOB: 1928/04/27, 83 y.o.   MRN: [id number] Complaint:  Visit Type: Patient returns to my office for continued preventative foot care services. Complaint: Patient states" my nails have grown long and thick and become painful to walk and wear shoes" . The patient presents for preventative foot care services. No changes to ROS  Podiatric Exam: Vascular: dorsalis pedis and posterior tibial pulses are palpable bilateral. Capillary return is immediate. Temperature gradient is WNL. Skin turgor WNL  Sensorium: Normal Semmes Weinstein monofilament test. Normal tactile sensation bilaterally. Nail Exam: Pt has thick disfigured discolored nails with subungual debris noted bilateral entire nail hallux  Ulcer Exam: There is no evidence of ulcer or pre-ulcerative changes or infection. Orthopedic Exam: Muscle tone and strength are WNL. No limitations in general ROM. No crepitus or effusions noted. Foot type and digits show no abnormalities. Bony prominences are unremarkable. Skin: No Porokeratosis. No infection or ulcers  Diagnosis:  Onychomycosis, , Pain in right toe, pain in left toes  Treatment & Plan Procedures and Treatment: Consent by patient was obtained for treatment procedures. The patient understood the discussion of treatment and procedures well. All questions were answered thoroughly reviewed. Debridement of mycotic and hypertrophic toenails, 1 through 5 bilateral and clearing of subungual debris. No ulceration, no infection noted.  Return Visit-Office Procedure: Patient instructed to return to the office for a follow up visit 3 months for continued evaluation and treatment.  Gardiner Barefoot DPM

## 2016-11-09 NOTE — Telephone Encounter (Signed)
Yes she should be taking this every day

## 2016-11-10 NOTE — Telephone Encounter (Signed)
I spoke with pt and went over below information. 

## 2016-11-15 DIAGNOSIS — J9611 Chronic respiratory failure with hypoxia: Secondary | ICD-10-CM | POA: Diagnosis not present

## 2016-11-17 ENCOUNTER — Telehealth: Payer: Self-pay | Admitting: Family Medicine

## 2016-11-17 ENCOUNTER — Ambulatory Visit (INDEPENDENT_AMBULATORY_CARE_PROVIDER_SITE_OTHER): Payer: Medicare PPO | Admitting: Internal Medicine

## 2016-11-17 ENCOUNTER — Other Ambulatory Visit (INDEPENDENT_AMBULATORY_CARE_PROVIDER_SITE_OTHER): Payer: Medicare PPO

## 2016-11-17 ENCOUNTER — Encounter: Payer: Self-pay | Admitting: Internal Medicine

## 2016-11-17 VITALS — BP 128/80 | HR 59 | Ht 64.0 in | Wt 188.4 lb

## 2016-11-17 DIAGNOSIS — R0602 Shortness of breath: Secondary | ICD-10-CM

## 2016-11-17 DIAGNOSIS — J9612 Chronic respiratory failure with hypercapnia: Secondary | ICD-10-CM

## 2016-11-17 DIAGNOSIS — J9611 Chronic respiratory failure with hypoxia: Secondary | ICD-10-CM

## 2016-11-17 LAB — BASIC METABOLIC PANEL
BUN: 25 mg/dL — ABNORMAL HIGH (ref 6–23)
CO2: 35 mEq/L — ABNORMAL HIGH (ref 19–32)
Calcium: 9.9 mg/dL (ref 8.4–10.5)
Chloride: 95 mEq/L — ABNORMAL LOW (ref 96–112)
Creatinine, Ser: 0.97 mg/dL (ref 0.40–1.20)
GFR: 57.75 mL/min — ABNORMAL LOW (ref >60.00)
Glucose, Bld: 114 mg/dL — ABNORMAL HIGH (ref 70–99)
Potassium: 4.3 mEq/L (ref 3.5–5.1)
Sodium: 137 mEq/L (ref 135–145)

## 2016-11-17 NOTE — Progress Notes (Signed)
Subjective:    Patient ID: Bianca Matthews, female    DOB: Nov 19, 1927     MRN: [id number]    Brief patient profile:  28 yowf from the [place] quit smoking 1970s with problems with upper resp  allergies in [place] prior to arrival in in [place] in 2002 and worse ever since here but never dx of copd/asthma then admit and on 02 since:     Admit date: 10/08/2016 Discharge date: 10/11/2016  Primary Care Physician:  Alysia Penna, MD  Discharge Diagnoses:    . Skull fracture with concussion ([place])   Acute hypoxic respiratory failure . acute Grade 2 diastolic CHF . Hypokalemia . Mechanical fall . Essential hypertension   Community-acquired pneumonia right lung   Consults:  None  Recommendations for Outpatient Follow-up:  1. DME rolling walker, 3 and 1 arranged by case management 2. Home O2 evaluation done, 3 L with exertion to be arranged by case management 3. Please repeat CBC/BMET at next visit 4. Referral has been sent to [facility] cardiology office, patient will be scheduled for appointment (i sent email message to Trish to schedule appointment)  Patient has been given the prescription of Toprol-XL by her PCP but she wants to finish the prescription of atenolol at home before she starts the Toprol-XL.   DIET: Heart healthy diet    Allergies:  No Known Allergies   DISCHARGE MEDICATIONS:     Current Discharge Medication List        START taking these medications   Details  acetaminophen (TYLENOL) 325 MG tablet Take 2 tablets (650 mg total) by mouth every 4 (four) hours as needed for headache or mild pain. Qty: 30 tablet, Refills: 0    guaiFENesin (MUCINEX) 600 MG 12 hr tablet Take 2 tablets (1,200 mg total) by mouth 2 (two) times daily. Qty: 60 tablet, Refills: 0    guaiFENesin-dextromethorphan (ROBITUSSIN DM) 100-10 MG/5ML syrup Take 5 mLs by mouth every 4 (four) hours as needed for cough. Qty: 118 mL, Refills: 0    levofloxacin (LEVAQUIN) 750 MG tablet  Take 1 tablet (750 mg total) by mouth every other day. Qty: 5 tablet, Refills: 0    losartan (COZAAR) 100 MG tablet Take 1 tablet (100 mg total) by mouth daily. Qty: 30 tablet, Refills: 4    meclizine (ANTIVERT) 12.5 MG tablet Take 1 tablet (12.5 mg total) by mouth 3 (three) times daily as needed for dizziness. Also available over the counter Qty: 90 tablet, Refills: 2    ondansetron (ZOFRAN ODT) 4 MG disintegrating tablet Take 1 tablet (4 mg total) by mouth every 8 (eight) hours as needed for nausea or vomiting. Qty: 20 tablet, Refills: 0          CONTINUE these medications which have CHANGED   Details  metoprolol succinate (TOPROL-XL) 100 MG 24 hr tablet Take 1 tablet (100 mg total) by mouth daily. Take with or immediately following a meal. Start after atenolol is finished. Qty: 90 tablet, Refills: 3          CONTINUE these medications which have NOT CHANGED   Details  Ascorbic Acid (VITAMIN C PO) Take 1 tablet by mouth daily.    aspirin 325 MG tablet Take 325 mg by mouth every evening. 4pm    atenolol (TENORMIN) 100 MG tablet Take 100 mg by mouth 2 (two) times daily.    co-enzyme Q-10 30 MG capsule Take 30 mg by mouth daily.    furosemide (LASIX) 40 MG tablet Take 1  tablet (40 mg total) by mouth 3 (three) times daily. Qty: 270 tablet, Refills: 3    Multiple Vitamin (MULTIVITAMIN WITH MINERALS) TABS tablet Take 1 tablet by mouth daily. Women's One a Day    omeprazole (PRILOSEC) 40 MG capsule TAKE 1 CAPSULE EVERY DAY Qty: 90 capsule, Refills: 3    potassium chloride SA (K-DUR,KLOR-CON) 20 MEQ tablet TAKE 1 TABLET EVERY DAY Qty: 90 tablet, Refills: 3    PRESCRIPTION MEDICATION Inject into the skin every Monday. Weekly allergy injections done at Dr. Renne Musca office    Probiotic Product (PROBIOTIC FORMULA PO) Take 1 tablet by mouth daily.     rosuvastatin (CRESTOR) 10 MG tablet Take 1 tablet (10 mg total) by mouth at bedtime. Qty: 90 tablet, Refills: 3           STOP taking these medications     losartan-hydrochlorothiazide (HYZAAR) 100-12.5 MG tablet          Brief H and P: For complete details please refer to admission H and P, but in brief Patient is a 83 year old female with hypertension, hyperlipidemia, GERD presented after a mechanical fall. She hit her Matthews hard on the concrete with subsequent dizziness, nausea and concussion after the fall. In the ER, patient was noted to be in mild respiratory distress with hypoxia and she reported having episodes of shortness of breath, dyspnea on exertion. She was diagnosed with bronchitis on 10/12 and finished a course of a Zithromax.  CT Matthews and cervical spine was significant for a nondisplaced and non-depressed right parietal toe occipital skull fracture. No Matthews bleed or cervical fracture.  Patient was noted to have O2 sats of 84% in ED and was placed on supplemental O2. Patient was admitted for fall/Matthews injury, concussion and acute hypoxic respiratory failure.   Hospital Course:  Skull fracture with concussion (HCC)With dizziness - CT Matthews and cervical spine was significant for a nondisplaced and non-depressed right parietal toe occipital skull fracture. No Matthews bleed or cervical fracture.  - Patient had significant dizziness on getting up, possibly had syncopal episode that led to the fall however patient could not recall the events. Lives alone - PTOT consult recommended home health PT - Initially placed on meclizine scheduled due to significant dizziness, Zofran as needed.  - PTOT evaluation recommended 24 7 supervision. Patient feels a lot improved today. I spoke with Dr. Kary Kos, neurosurgery who did not feel patient needed any neurosurgical intervention as this is expected post concussive symptoms. However patient can see him in the office as needed if she has long protracted course of her symptoms.  Acute hypoxic respiratory failure, community acquired pneumonia -  BNP was elevated at 296 with chest x-ray showing right lung opacity may reflect atelectasis or possibility of pneumonia. Cardiomegaly but no convincing pulmonary edema. - Patient received Lasix in ED. Patient reporting significant cough and productive phlegm, placed on Levaquin, Mucinex, Robitussin  Acute diastolic CHF - Per patient's family, she has been taking Lasix for a long time for peripheral edema however she did not have any prior echo. She was being worked up for CHF outpatient. 2-D echo was done which showed preserved EF however has grade 2 diastolic dysfunction. - She received IV Lasix in patient and will continue outpatient dose of Lasix '40mg'$  TID at discharge.   Mechanical fall - PTOT consult recommended home health PT  Hypokalemia - Resolved with supplementation  Essential hypertension - Currently stable, continue Tenormin (changing to metoprolol once she runs out of her current  supply) -Discontinued hydrochlorothiazide and Lasix dosing (concern for renal stress)  Hyperglycemia - No listed history of diabetes mellitus, will check hemoglobin A1c    11/03/2016 1st [facility] office visit/ Bianca Matthews   Chief Complaint  Patient presents with  . Pulmonary Consult    Referred by Dr. Hulan Fess, Pt. here today c/o increase sob with exertion, Pt.  here today to discuss if she needs to be on oxygen as needed or 24/7   doe since d/c = MMRC3 = can't walk 100 yards even at a slow pace at a flat grade s stopping due to sob  Even on 02  Leg swelling x years/ no better on lasix  Breathing was not really limiting her prior to her admit but she was becoming much more sedentary per her son pta rec Please remember to go to the lab and x-ray department downstairs in the basement  for your tests - we will call you with the results when they are available. Goal with 02 is to keep  over sat 90% at all times Continue 02 2lpm 24/7 but ok to turn up if needed to maintain over 90%    Aldactone 25 mg twice daily   Please schedule a follow up office visit in  2 weeks, sooner if needed with all active medications in hand      11/17/2016  f/u ov/Bianca Matthews re:  Chronic hypercarbic RF: On 2lpm sats improved and now  even walking [employer]/  Chief Complaint  Patient presents with  . Follow-up    Breathing has improved back to her normal baseline. No new co's.    sleeping on side horizontally comfortably / leg swelling improved  No obvious day to day or daytime variability or assoc excess/ purulent sputum or mucus plugs or hemoptysis or cp or chest tightness, subjective wheeze or overt sinus or hb symptoms. No unusual exp hx or h/o childhood pna/ asthma or knowledge of premature birth.  Sleeping ok without nocturnal  or early am exacerbation  of respiratory  c/o's or need for noct saba. Also denies any obvious fluctuation of symptoms with weather or environmental changes or other aggravating or alleviating factors except as outlined above   Current Medications, Allergies, Complete Past Medical History, Past Surgical History, Family History, and Social History were reviewed in [employer] record.  ROS  The following are not active complaints unless bolded sore throat, dysphagia, dental problems, itching, sneezing,  nasal congestion or excess/ purulent secretions, ear ache,   fever, chills, sweats, unintended wt loss, classically pleuritic or exertional cp,  orthopnea pnd or leg swelling, presyncope, palpitations, abdominal pain, anorexia, nausea, vomiting, diarrhea  or change in bowel or bladder habits, change in stools or urine, dysuria,hematuria,  rash, arthralgias, visual complaints, headache, numbness, weakness or ataxia or problems with walking or coordination,  change in mood/affect or memory.                     Objective:   Physical Exam  amb stoic wf lets her son do the talking    11/17/2016       188   11/03/16 194 lb 9.6 oz (88.3 kg)   10/31/16 191 lb (86.6 kg)  10/27/16 195 lb 9.6 oz (88.7 kg)    Vital signs reviewed - Note on arrival 02 sats  96% on 2lpm      HEENT: nl dentition, turbinates, and oropharynx. Nl external ear canals without cough reflex   NECK :  without JVD/Nodes/TM/ nl carotid upstrokes bilaterally   LUNGS: no acc muscle use,  Nl contour chest with moderately distant bs bilaterally but no wheeze    CV:  RRR  no s3 or murmur or increase in P2,  And  1-2+ ptting  lower ext sym  Bilaterally   ABD:  soft and nontender with nl inspiratory excursion in the supine position. No bruits or organomegaly appreciated, bowel sounds nl  MS:  Nl gait/ ext warm without deformities, calf tenderness, cyanosis or clubbing No obvious joint restrictions   SKIN: warm and dry without lesions    NEURO:  alert, approp, nl sensorium with  no motor or cerebellar deficits apparent.     CXR PA and Lateral:   11/03/2016 :    I personally reviewed images and agree with radiology impression as follows:   1.  Low lung volumes with bibasilar atelectasis. 2. Cardiomegaly.  No pulmonary venous congestion .      Labs ordered/ reviewed    Chemistry      Component Value Date/Time   NA 137 11/17/2016 0957   K 4.3 11/17/2016 0957   CL 95 (L) 11/17/2016 0957   CO2 35 (H) 11/17/2016 0957   BUN 25 (H) 11/17/2016 0957   CREATININE 0.97 11/17/2016 0957      Component Value Date/Time   CALCIUM 9.9 11/17/2016 0957   ALKPHOS 48 12/02/2015 0938   AST 18 12/02/2015 0938   ALT 17 12/02/2015 0938   BILITOT 0.7 12/02/2015 0938               Assessment & Plan:

## 2016-11-17 NOTE — Patient Instructions (Addendum)
Please remember to go to the lab  department downstairs in the basement  for your tests - we will call you with the results when they are available.  Continue to wear the oxygen 2lpm at bedtime and during the day  Ok to adjust to keep your saturations above 90%   See Tammy NP w/in 4  weeks with all your medications, even over the counter meds, separated in two separate bags, the ones you take no matter what vs the ones you stop once you feel better and take only as needed when you feel you need them.   Tammy  will generate for you a new user friendly medication calendar that will put [place] all on the same page re: your medication use.     Without this process, it simply isn't possible to assure that we are providing  your outpatient care  with  the attention to detail we feel you deserve.   If we cannot assure that you're getting that kind of care,  then we cannot manage your problem effectively from this clinic.  Once you have seen Tammy and we are sure that we're all on the same page with your medication use she will arrange follow up with me.

## 2016-11-17 NOTE — Telephone Encounter (Signed)
I spoke with BJ with home care and gave verbal orders, per Dr. Sarajane Jews okay.

## 2016-11-17 NOTE — Telephone Encounter (Signed)
Per dr wert pt needs to extend PT for 2 wk from [facility]. Pt had a fall. Please phone order to [phone] fax # [phone]

## 2016-11-19 NOTE — Assessment & Plan Note (Signed)
On 02 since d/c 10/11/16 - HCO3  11/03/2016  = 35   Adequate control on present rx, reviewed in detail with pt > no change in rx needed  = 2lpm but ok to titrate up to keep > 90% with activity if needed

## 2016-11-19 NOTE — Assessment & Plan Note (Addendum)
Echo 10/10/16 Left ventricle: The cavity size was normal. There was moderate   concentric hypertrophy. Systolic function was normal. The   estimated ejection fraction was in the range of 60% to 65%. Wall   motion was normal; there were no regional wall motion   abnormalities. Features are consistent with a pseudonormal left   ventricular filling pattern, with concomitant abnormal relaxation   and increased filling pressure (grade 2 diastolic dysfunction).   Doppler parameters are consistent with high ventricular filling   pressure. - Aortic valve: There was very mild stenosis. Valve area (VTI):   1.72 cm^2. Valve area (Vmax): 1.74 cm^2. Valve area (Vmean): 1.81   cm^2. - Mitral valve: There was mild regurgitation. - Atrial septum: There was increased thickness of the septum,   consistent with lipomatous hypertrophy. - Pulmonary arteries: PA peak pressure: 33 mm Hg (S). Spirometry 11/03/2016  Purely restrictive     This problem appears to be entirely restrictive and related to diastolic dysfunction / chronic vol overload better after adding aldactone to her diuretic regimen and tol well so far so no changes needed  For now. I do note both she and son are struggling with concept of med reconciliation   To keep things simple, I have asked the patient to first separate medicines that are perceived as maintenance, that is to be taken daily "no matter what", from those medicines that are taken on only on an as-needed basis and I have given the patient examples of both, and then return to see our NP in 4 weeks  to generate a  detailed  medication calendar which should be followed until the next physician sees the patient and updates it.    I had an extended discussion with the patient reviewing all relevant studies completed to date and  lasting 15 to 20 minutes of a 25 minute visit    Each maintenance medication was reviewed in detail including most importantly the difference between maintenance and  prns and under what circumstances the prns are to be triggered using an action plan format that is not reflected in the computer generated alphabetically organized AVS.    Please see AVS for specific instructions unique to this visit that I personally wrote and verbalized to the the pt in detail and then reviewed with pt  by my nurse highlighting any  changes in therapy recommended at today's visit to their plan of care.

## 2016-11-20 ENCOUNTER — Ambulatory Visit: Payer: Medicare PPO | Admitting: Internal Medicine

## 2016-11-20 NOTE — Progress Notes (Signed)
Called patient and voicemail has not been set up. Will call again later.

## 2016-11-21 NOTE — Progress Notes (Signed)
Spoke with pt and notified of results per Dr. Wert. Pt verbalized understanding and denied any questions. 

## 2016-12-05 ENCOUNTER — Telehealth: Payer: Self-pay | Admitting: Family Medicine

## 2016-12-05 NOTE — Telephone Encounter (Signed)
Pt need new Rx for atenolol #90 and spironolactone #90  Pharm:  [facility] on [address] and [address]  [phone]).  Pt would like to have a call when this has been done.

## 2016-12-06 MED ORDER — ATENOLOL 100 MG PO TABS: 100.0000 mg | ORAL_TABLET | Freq: Two times a day (BID) | ORAL | 3 refills | Status: DC

## 2016-12-06 MED ORDER — SPIRONOLACTONE 25 MG PO TABS: 25.0000 mg | ORAL_TABLET | Freq: Two times a day (BID) | ORAL | 3 refills | Status: DC

## 2016-12-06 NOTE — Telephone Encounter (Signed)
Yes refill both for one year

## 2016-12-06 NOTE — Telephone Encounter (Signed)
Okay to refill? 

## 2016-12-06 NOTE — Addendum Note (Signed)
Addended by: Aggie Hacker A on: 12/06/2016 04:58 PM   Modules accepted: Orders

## 2016-12-06 NOTE — Addendum Note (Signed)
Addended by: Aggie Hacker A on: 12/06/2016 04:48 PM   Modules accepted: Orders

## 2016-12-06 NOTE — Telephone Encounter (Signed)
I sent both scripts e-scribe for a 90 day supply to [facility] mail order and spoke with pt.

## 2016-12-13 ENCOUNTER — Telehealth: Payer: Self-pay | Admitting: Family Medicine

## 2016-12-13 NOTE — Telephone Encounter (Signed)
I called in a 30 day supply of Atenolol 100 mg to [facility].

## 2016-12-13 NOTE — Telephone Encounter (Signed)
Pt needs new rx atenolol 100 mg #10 send to [facility]. Pt is waiting for mail order

## 2016-12-14 NOTE — Telephone Encounter (Signed)
Pt state that her insurance [facility]) need a new script so that they are able to continue to refill this medication Atenolol  [phone] (F).

## 2016-12-15 ENCOUNTER — Ambulatory Visit (INDEPENDENT_AMBULATORY_CARE_PROVIDER_SITE_OTHER): Payer: Medicare PPO | Admitting: Adult Health

## 2016-12-15 ENCOUNTER — Other Ambulatory Visit (INDEPENDENT_AMBULATORY_CARE_PROVIDER_SITE_OTHER): Payer: Medicare PPO

## 2016-12-15 ENCOUNTER — Encounter: Payer: Self-pay | Admitting: Adult Health

## 2016-12-15 ENCOUNTER — Telehealth: Payer: Self-pay | Admitting: Adult Health

## 2016-12-15 VITALS — BP 124/76 | HR 56 | Ht 64.0 in | Wt 182.6 lb

## 2016-12-15 DIAGNOSIS — J9611 Chronic respiratory failure with hypoxia: Secondary | ICD-10-CM

## 2016-12-15 DIAGNOSIS — J9612 Chronic respiratory failure with hypercapnia: Secondary | ICD-10-CM | POA: Diagnosis not present

## 2016-12-15 DIAGNOSIS — R0602 Shortness of breath: Secondary | ICD-10-CM

## 2016-12-15 DIAGNOSIS — I5030 Unspecified diastolic (congestive) heart failure: Secondary | ICD-10-CM | POA: Diagnosis not present

## 2016-12-15 LAB — BASIC METABOLIC PANEL
BUN: 62 mg/dL — ABNORMAL HIGH (ref 6–23)
CO2: 30 mEq/L (ref 19–32)
Calcium: 10.5 mg/dL (ref 8.4–10.5)
Chloride: 86 mEq/L — ABNORMAL LOW (ref 96–112)
Creatinine, Ser: 1.37 mg/dL — ABNORMAL HIGH (ref 0.40–1.20)
GFR: 38.77 mL/min — ABNORMAL LOW (ref >60.00)
Glucose, Bld: 118 mg/dL — ABNORMAL HIGH (ref 70–99)
Potassium: 5.3 mEq/L — ABNORMAL HIGH (ref 3.5–5.1)
Sodium: 124 mEq/L — ABNORMAL LOW (ref 135–145)

## 2016-12-15 NOTE — Progress Notes (Signed)
$'@Patient'i$  ID: Bianca [place], female    DOB: December 20, 1927, 83 y.o.   MRN: [id number]  Chief Complaint  Patient presents with  . Follow-up    Referring provider: Laurey Morale, MD  HPI: 83 year old female former smoker seen for pulmonary consult 11/03/2016 for dyspnea/ Chronic hypercarbic RF on O2 ,m, DCHF   TEST /Events  On 02 since d/c 10/11/16 - HCO3  11/03/2016  = 35  Echo 10/10/16 Left ventricle: The cavity size was normal. There was moderate   concentric hypertrophy. Systolic function was normal. The   estimated ejection fraction was in the range of 60% to 65%. Wall   motion was normal; there were no regional wall motion   abnormalities. Features are consistent with a pseudonormal left   ventricular filling pattern, with concomitant abnormal relaxation   and increased filling pressure (grade 2 diastolic dysfunction).   Doppler parameters are consistent with high ventricular filling   pressure. - Aortic valve: There was very mild stenosis. Valve area (VTI):   1.72 cm^2. Valve area (Vmax): 1.74 cm^2. Valve area (Vmean): 1.81   cm^2. - Mitral valve: There was mild regurgitation. - Atrial septum: There was increased thickness of the septum,   consistent with lipomatous hypertrophy. - Pulmonary arteries: PA peak pressure: 33 mm Hg (S). Spirometry 11/03/2016  Purely restrictive      12/15/2016 Follow up : Dyspnea/O2 RF /D CHF  Pt returns for a 1 month follow up . She was recently admitted in January after fall with skull fx. She had decompensated D CHF that required diuresised. She was discharged on O2 for hypoxic/Hypercarbic RF . Spirometry showed restrictive lung dz . She was felt to have persistent volume overload with LE Edema . Aldactone was added to her lasix dose.  She returns today feeling much better. Wt is down ~12lbs. Leg swelling is decreased. O2 sats during daytime have been >90% on RA , she only wears o2 when needed now during daytime and keeps O2 2l/m at bedtime.   We  reviewed her medications organize them into a medication count with patient education. It appears that she is taking her medications correctly  No Known Allergies  Immunization History  Administered Date(s) Administered  . Influenza Split 07/02/2012, 07/21/2013  . Influenza Whole 08/02/2004, 06/27/2010  . Influenza, High Dose Seasonal PF 08/03/2016  . Influenza,inj,Quad PF,36+ Mos 06/16/2014  . Influenza-Unspecified 07/03/2015  . Pneumococcal Polysaccharide-23 05/06/2008    Past Medical History:  Diagnosis Date  . Allergy    sees Dr. [name]  . GERD (gastroesophageal reflux disease)   . Hyperlipidemia   . Hypertension   . Varicose veins     Tobacco History: History  Smoking Status  . Former Smoker  . Packs/day: 0.50  . Years: 10.00  Smokeless Tobacco  . Never Used   Counseling given: Not Answered   Outpatient Encounter Prescriptions as of 12/15/2016  Medication Sig  . acetaminophen (TYLENOL) 325 MG tablet Take 2 tablets (650 mg total) by mouth every 4 (four) hours as needed for headache or mild pain.  . Ascorbic Acid (VITAMIN C PO) Take 1 tablet by mouth daily.  Marland Kitchen aspirin 325 MG tablet Take 325 mg by mouth every evening. 4pm  . atenolol (TENORMIN) 100 MG tablet Take 1 tablet (100 mg total) by mouth 2 (two) times daily.  Marland Kitchen co-enzyme Q-10 30 MG capsule Take 30 mg by mouth daily.  . furosemide (LASIX) 40 MG tablet Take 2 tablets (80 mg total) by mouth 2 (two) times  daily.  . guaiFENesin (MUCINEX) 600 MG 12 hr tablet Take 2 tablets (1,200 mg total) by mouth 2 (two) times daily.  Marland Kitchen losartan (COZAAR) 100 MG tablet Take 1 tablet (100 mg total) by mouth daily.  . Multiple Vitamin (MULTIVITAMIN WITH MINERALS) TABS tablet Take 1 tablet by mouth daily. Women's One a Day  . omeprazole (PRILOSEC) 40 MG capsule TAKE 1 CAPSULE EVERY DAY  . PRESCRIPTION MEDICATION Inject into the skin every Monday. Weekly allergy injections done at Dr. Renne Musca office  . Probiotic Product (PROBIOTIC  FORMULA PO) Take 1 tablet by mouth daily.   . rosuvastatin (CRESTOR) 10 MG tablet Take 1 tablet (10 mg total) by mouth at bedtime. (Patient taking differently: Take 10 mg by mouth every evening. 4pm)  . spironolactone (ALDACTONE) 25 MG tablet Take 1 tablet (25 mg total) by mouth 2 (two) times daily.   No facility-administered encounter medications on file as of 12/15/2016.      Review of Systems  Constitutional:   No  weight loss, night sweats,  Fevers, chills,  +fatigue, or  lassitude.  HEENT:   No headaches,  Difficulty swallowing,  Tooth/dental problems, or  Sore throat,                No sneezing, itching, ear ache, nasal congestion, post nasal drip,   CV:  No chest pain,  Orthopnea, PND,  +swelling in lower extremities,  No anasarca, dizziness, palpitations, syncope.   GI  No heartburn, indigestion, abdominal pain, nausea, vomiting, diarrhea, change in bowel habits, loss of appetite, bloody stools.   Resp:  No excess mucus, no productive cough,  No non-productive cough,  No coughing up of blood.  No change in color of mucus.  No wheezing.  No chest wall deformity  Skin: no rash or lesions.  GU: no dysuria, change in color of urine, no urgency or frequency.  No flank pain, no hematuria   MS:  No joint pain or swelling.  No decreased range of motion.  No back pain.    Physical Exam  BP 124/76 (BP Location: Left Arm, Patient Position: Sitting, Cuff Size: Normal)   Pulse (!) 56   Ht '5\' 4"'$  (1.626 m)   Wt 182 lb 9.6 oz (82.8 kg)   SpO2 95%   BMI 31.34 kg/m   GEN: A/Ox3; pleasant , NAD, elderly    HEENT:  [place]/AT,  EACs-clear, TMs-wnl, NOSE-clear, THROAT-clear, no lesions, no postnasal drip or exudate noted.   NECK:  Supple w/ fair ROM; no JVD; normal carotid impulses w/o bruits; no thyromegaly or nodules palpated; no lymphadenopathy.    RESP  Clear  P & A; w/o, wheezes/ rales/ or rhonchi. no accessory muscle use, no dullness to percussion  CARD:  RRR, no m/r/g, 1+  peripheral edema, pulses intact, no cyanosis or clubbing.  GI:   Soft & nt; nml bowel sounds; no organomegaly or masses detected.   Musco: Warm bil, no deformities or joint swelling noted.   Neuro: alert, no focal deficits noted.    Skin: Warm, no lesions or rashes    Lab Results:  CBC    Component Value Date/Time   WBC 5.8 11/03/2016 1302   RBC 4.10 11/03/2016 1302   HGB 12.6 11/03/2016 1302   HCT 38.0 11/03/2016 1302   PLT 247.0 11/03/2016 1302   MCV 92.6 11/03/2016 1302   MCH 30.1 10/09/2016 0248   MCHC 33.2 11/03/2016 1302   RDW 14.8 11/03/2016 1302   LYMPHSABS 1.4 11/03/2016 1302  MONOABS 0.5 11/03/2016 1302   EOSABS 0.2 11/03/2016 1302   BASOSABS 0.0 11/03/2016 1302    BMET    Component Value Date/Time   NA 137 11/17/2016 0957   K 4.3 11/17/2016 0957   CL 95 (L) 11/17/2016 0957   CO2 35 (H) 11/17/2016 0957   GLUCOSE 114 (H) 11/17/2016 0957   GLUCOSE 100 (H) 08/09/2006 1117   BUN 25 (H) 11/17/2016 0957   CREATININE 0.97 11/17/2016 0957   CALCIUM 9.9 11/17/2016 0957   GFRNONAA 41 (L) 10/11/2016 0529   GFRAA 48 (L) 10/11/2016 0529    BNP    Component Value Date/Time   BNP 296.9 (H) 10/08/2016 1838    ProBNP    Component Value Date/Time   PROBNP 239.0 (H) 11/03/2016 1302    Imaging: No results found.   Assessment & Plan:   CHF (congestive heart failure) (HCC) D CHF-improved control on current regimen  Need BMET  Patient's medications were reviewed today and patient education was given. Computerized medication calendar was adjusted/completed   Plan Patient Instructions  Continue on Oxygen 2l/m At bedtime   May use oxygen 2l/m with activity , As needed  To keep sats >88-90% .  Follow med calendar closely and bring to each visit.  Labs today .  ONO 1 week prior to next office visit.  follow up Dr. Melvyn Novas  In 6-8 weeks and As needed   Please contact office for sooner follow up if symptoms do not improve or worsen or seek emergency care       Chronic respiratory failure with hypoxia and hypercapnia ([place]) Improved on O2  Goal to keep O2 sat >88-90%.  Check ono prior to next ov on return to see if bedtime o2 is needed   Shortness of breath Improved with diuresis , so most likely [facility] was major contributor of dyspnea.      Rexene Edison, NP 12/15/2016

## 2016-12-15 NOTE — Telephone Encounter (Signed)
This request was faxed on 12/14/2016.

## 2016-12-15 NOTE — Telephone Encounter (Signed)
I spoke with pt also and gave below message.

## 2016-12-15 NOTE — Progress Notes (Signed)
Called spoke with patient, advised of lab results / recs as stated by TP.  Pt verbalized her understanding and denied any questions.  Also LMOM TCB x1 for pt's son [name] (he accompanied pt to the appt this morning) to discuss results as well per pt's request.

## 2016-12-15 NOTE — Assessment & Plan Note (Signed)
D CHF-improved control on current regimen  Need BMET  Patient's medications were reviewed today and patient education was given. Computerized medication calendar was adjusted/completed   Plan Patient Instructions  Continue on Oxygen 2l/m At bedtime   May use oxygen 2l/m with activity , As needed  To keep sats >88-90% .  Follow med calendar closely and bring to each visit.  Labs today .  ONO 1 week prior to next office visit.  follow up Dr. Melvyn Novas  In 6-8 weeks and As needed   Please contact office for sooner follow up if symptoms do not improve or worsen or seek emergency care

## 2016-12-15 NOTE — Assessment & Plan Note (Signed)
Improved with diuresis , so most likely [facility] was major contributor of dyspnea.

## 2016-12-15 NOTE — Progress Notes (Signed)
Chart and office note reviewed in detail  > agree with a/p as outlined    

## 2016-12-15 NOTE — Telephone Encounter (Signed)
Attempted to contact pt. No answer, no option to leave a message. Will try back.  

## 2016-12-15 NOTE — Patient Instructions (Addendum)
Continue on Oxygen 2l/m At bedtime   May use oxygen 2l/m with activity , As needed  To keep sats >88-90% .  Follow med calendar closely and bring to each visit.  Labs today .  ONO 1 week prior to next office visit.  follow up Dr. Melvyn Novas  In 6-8 weeks and As needed   Please contact office for sooner follow up if symptoms do not improve or worsen or seek emergency care

## 2016-12-15 NOTE — Assessment & Plan Note (Signed)
Improved on O2  Goal to keep O2 sat >88-90%.  Check ono prior to next ov on return to see if bedtime o2 is needed

## 2016-12-18 NOTE — Telephone Encounter (Signed)
Spoke with pt, aware of results/recs.  Nothing further needed.  

## 2016-12-19 NOTE — Progress Notes (Signed)
Per pt's chart, son returned call and received lab results

## 2016-12-20 ENCOUNTER — Other Ambulatory Visit: Payer: Self-pay | Admitting: Family Medicine

## 2016-12-22 ENCOUNTER — Other Ambulatory Visit (INDEPENDENT_AMBULATORY_CARE_PROVIDER_SITE_OTHER): Payer: Medicare PPO

## 2016-12-22 ENCOUNTER — Other Ambulatory Visit: Payer: Self-pay | Admitting: Internal Medicine

## 2016-12-22 ENCOUNTER — Telehealth: Payer: Self-pay | Admitting: Internal Medicine

## 2016-12-22 DIAGNOSIS — R0602 Shortness of breath: Secondary | ICD-10-CM

## 2016-12-22 LAB — BASIC METABOLIC PANEL
BUN: 46 mg/dL — ABNORMAL HIGH (ref 6–23)
CO2: 28 mEq/L (ref 19–32)
Calcium: 9.7 mg/dL (ref 8.4–10.5)
Chloride: 92 mEq/L — ABNORMAL LOW (ref 96–112)
Creatinine, Ser: 1.29 mg/dL — ABNORMAL HIGH (ref 0.40–1.20)
GFR: 41.55 mL/min — ABNORMAL LOW (ref >60.00)
Glucose, Bld: 108 mg/dL — ABNORMAL HIGH (ref 70–99)
Potassium: 5 mEq/L (ref 3.5–5.1)
Sodium: 128 mEq/L — ABNORMAL LOW (ref 135–145)

## 2016-12-22 NOTE — Telephone Encounter (Signed)
Pt came in the office because she received a letter from [facility] stating that they could not process order for Spironolactone due to possible medication interaction. Dr. Sarajane Jews is the prescriber of this mediation and pt is aware to reach out to their office in regards to this. Nothing further is needed.

## 2016-12-22 NOTE — Progress Notes (Signed)
Spoke with pt's son and notified of results per Dr. Melvyn Novas. He verbalized understanding and denied any questions.

## 2016-12-27 NOTE — Addendum Note (Signed)
Addended by: Parke Poisson E on: 12/27/2016 10:11 AM   Modules accepted: Orders

## 2017-01-23 ENCOUNTER — Encounter: Payer: Self-pay | Admitting: Adult Health

## 2017-01-26 ENCOUNTER — Other Ambulatory Visit (INDEPENDENT_AMBULATORY_CARE_PROVIDER_SITE_OTHER): Payer: Medicare PPO

## 2017-01-26 ENCOUNTER — Ambulatory Visit (INDEPENDENT_AMBULATORY_CARE_PROVIDER_SITE_OTHER): Payer: Medicare PPO | Admitting: Internal Medicine

## 2017-01-26 ENCOUNTER — Encounter: Payer: Self-pay | Admitting: Internal Medicine

## 2017-01-26 VITALS — BP 140/74 | HR 67 | Ht 64.0 in | Wt 182.0 lb

## 2017-01-26 DIAGNOSIS — J309 Allergic rhinitis, unspecified: Secondary | ICD-10-CM

## 2017-01-26 DIAGNOSIS — I5032 Chronic diastolic (congestive) heart failure: Secondary | ICD-10-CM | POA: Diagnosis not present

## 2017-01-26 DIAGNOSIS — R0602 Shortness of breath: Secondary | ICD-10-CM

## 2017-01-26 DIAGNOSIS — J9611 Chronic respiratory failure with hypoxia: Secondary | ICD-10-CM | POA: Diagnosis not present

## 2017-01-26 DIAGNOSIS — J9612 Chronic respiratory failure with hypercapnia: Secondary | ICD-10-CM

## 2017-01-26 LAB — CBC WITH DIFFERENTIAL/PLATELET
Basophils Absolute: 0 10*3/uL (ref 0.0–0.1)
Basophils Relative: 0.6 % (ref 0.0–3.0)
Eosinophils Absolute: 0.2 10*3/uL (ref 0.0–0.7)
Eosinophils Relative: 2.2 % (ref 0.0–5.0)
HCT: 37.7 % (ref 36.0–46.0)
Hemoglobin: 12.7 g/dL (ref 12.0–15.0)
Lymphocytes Relative: 21.2 % (ref 12.0–46.0)
Lymphs Abs: 1.5 10*3/uL (ref 0.7–4.0)
MCHC: 33.7 g/dL (ref 30.0–36.0)
MCV: 91.6 fl (ref 78.0–100.0)
Monocytes Absolute: 0.5 10*3/uL (ref 0.1–1.0)
Monocytes Relative: 7.7 % (ref 3.0–12.0)
Neutro Abs: 4.7 10*3/uL (ref 1.4–7.7)
Neutrophils Relative %: 68.3 % (ref 43.0–77.0)
Platelets: 275 10*3/uL (ref 150.0–400.0)
RBC: 4.11 Mil/uL (ref 3.87–5.11)
RDW: 15 % (ref 11.5–15.5)
WBC: 6.9 10*3/uL (ref 4.0–10.5)

## 2017-01-26 LAB — BASIC METABOLIC PANEL
BUN: 29 mg/dL — ABNORMAL HIGH (ref 6–23)
CO2: 30 mEq/L (ref 19–32)
Calcium: 10.2 mg/dL (ref 8.4–10.5)
Chloride: 95 mEq/L — ABNORMAL LOW (ref 96–112)
Creatinine, Ser: 0.99 mg/dL (ref 0.40–1.20)
GFR: 56.38 mL/min — ABNORMAL LOW (ref >60.00)
Glucose, Bld: 107 mg/dL — ABNORMAL HIGH (ref 70–99)
Potassium: 4.6 mEq/L (ref 3.5–5.1)
Sodium: 133 mEq/L — ABNORMAL LOW (ref 135–145)

## 2017-01-26 MED ORDER — AZELASTINE-FLUTICASONE 137-50 MCG/ACT NA SUSP: 1.0000 | Freq: Two times a day (BID) | NASAL | 11 refills | Status: DC

## 2017-01-26 MED ORDER — AZELASTINE-FLUTICASONE 137-50 MCG/ACT NA SUSP: 1.0000 | Freq: Two times a day (BID) | NASAL | 0 refills | Status: DC

## 2017-01-26 NOTE — Progress Notes (Signed)
Spoke with pt and notified of results per Dr. Wert. Pt verbalized understanding and denied any questions. 

## 2017-01-26 NOTE — Progress Notes (Signed)
Subjective:    Patient ID: Bianca Matthews, female    DOB: [birth year]     MRN: [id number]    Brief patient profile:  66   [place]owf from the [place] quit smoking 1970s with problems with upper resp  allergies in [place] prior to arrival in in [place] in 2002 and worse ever since here but never dx of copd/asthma then admit and on 02 since:   No better on immunotherapy which she stopped in Jan 2018     Admit date: 10/08/2016 Discharge date: 10/11/2016  Primary Care Physician:  Alysia Penna, MD  Discharge Diagnoses:    . Skull fracture with concussion ([name])   Acute hypoxic respiratory failure . acute Grade 2 diastolic CHF . Hypokalemia . Mechanical fall . Essential hypertension   Community-acquired pneumonia right lung     Recommendations for Outpatient Follow-up:  1. DME rolling walker, 3 and 1 arranged by case management 2. Home O2 evaluation done, 3 L with exertion to be arranged by case management 3. Please repeat CBC/BMET at next visit 4. Referral has been sent to [facility] cardiology office, patient will be scheduled for appointment (i sent email message to Trish to schedule appointment)  Patient has been given the prescription of Toprol-XL by her PCP but she wants to finish the prescription of atenolol at home before she starts the Toprol-XL.   DIET: Heart healthy diet    Allergies:  No Known Allergies   DISCHARGE MEDICATIONS:     Current Discharge Medication List        START taking these medications   Details  acetaminophen (TYLENOL) 325 MG tablet Take 2 tablets (650 mg total) by mouth every 4 (four) hours as needed for headache or mild pain. Qty: 30 tablet, Refills: 0    guaiFENesin (MUCINEX) 600 MG 12 hr tablet Take 2 tablets (1,200 mg total) by mouth 2 (two) times daily. Qty: 60 tablet, Refills: 0    guaiFENesin-dextromethorphan (ROBITUSSIN DM) 100-10 MG/5ML syrup Take 5 mLs by mouth every 4 (four) hours as needed for cough. Qty: 118 mL, Refills: 0      levofloxacin (LEVAQUIN) 750 MG tablet Take 1 tablet (750 mg total) by mouth every other day. Qty: 5 tablet, Refills: 0    losartan (COZAAR) 100 MG tablet Take 1 tablet (100 mg total) by mouth daily. Qty: 30 tablet, Refills: 4    meclizine (ANTIVERT) 12.5 MG tablet Take 1 tablet (12.5 mg total) by mouth 3 (three) times daily as needed for dizziness. Also available over the counter Qty: 90 tablet, Refills: 2    ondansetron (ZOFRAN ODT) 4 MG disintegrating tablet Take 1 tablet (4 mg total) by mouth every 8 (eight) hours as needed for nausea or vomiting. Qty: 20 tablet, Refills: 0          CONTINUE these medications which have CHANGED   Details  metoprolol succinate (TOPROL-XL) 100 MG 24 hr tablet Take 1 tablet (100 mg total) by mouth daily. Take with or immediately following a meal. Start after atenolol is finished. Qty: 90 tablet, Refills: 3          CONTINUE these medications which have NOT CHANGED   Details  Ascorbic Acid (VITAMIN C PO) Take 1 tablet by mouth daily.    aspirin 325 MG tablet Take 325 mg by mouth every evening. 4pm    atenolol (TENORMIN) 100 MG tablet Take 100 mg by mouth 2 (two) times daily.    co-enzyme Q-10 30 MG capsule Take 30 mg  by mouth daily.    furosemide (LASIX) 40 MG tablet Take 1 tablet (40 mg total) by mouth 3 (three) times daily. Qty: 270 tablet, Refills: 3    Multiple Vitamin (MULTIVITAMIN WITH MINERALS) TABS tablet Take 1 tablet by mouth daily. Women's One a Day    omeprazole (PRILOSEC) 40 MG capsule TAKE 1 CAPSULE EVERY DAY Qty: 90 capsule, Refills: 3    potassium chloride SA (K-DUR,KLOR-CON) 20 MEQ tablet TAKE 1 TABLET EVERY DAY Qty: 90 tablet, Refills: 3    PRESCRIPTION MEDICATION Inject into the skin every Monday. Weekly allergy injections done at Dr. Renne Musca office    Probiotic Product (PROBIOTIC FORMULA PO) Take 1 tablet by mouth daily.     rosuvastatin (CRESTOR) 10 MG tablet Take 1 tablet (10 mg total) by  mouth at bedtime. Qty: 90 tablet, Refills: 3         STOP taking these medications     losartan-hydrochlorothiazide (HYZAAR) 100-12.5 MG tablet          Brief H and P: For complete details please refer to admission H and P, but in brief Patient is a 83 year old female with hypertension, hyperlipidemia, GERD presented after a mechanical fall. She hit her head hard on the concrete with subsequent dizziness, nausea and concussion after the fall. In the ER, patient was noted to be in mild respiratory distress with hypoxia and she reported having episodes of shortness of breath, dyspnea on exertion. She was diagnosed with bronchitis on 10/12 and finished a course of a Zithromax.  CT head and cervical spine was significant for a nondisplaced and non-depressed right parietal toe occipital skull fracture. No head bleed or cervical fracture.  Patient was noted to have O2 sats of 84% in ED and was placed on supplemental O2. Patient was admitted for fall/head injury, concussion and acute hypoxic respiratory failure.   Hospital Course:  Skull fracture with concussion (HCC)With dizziness - CT head and cervical spine was significant for a nondisplaced and non-depressed right parietal toe occipital skull fracture. No head bleed or cervical fracture.  - Patient had significant dizziness on getting up, possibly had syncopal episode that led to the fall however patient could not recall the events. Lives alone - PTOT consult recommended home health PT - Initially placed on meclizine scheduled due to significant dizziness, Zofran as needed.  - PTOT evaluation recommended 24 7 supervision. Patient feels a lot improved today. I spoke with Dr. Kary Kos, neurosurgery who did not feel patient needed any neurosurgical intervention as this is expected post concussive symptoms. However patient can see him in the office as needed if she has long protracted course of her symptoms.  Acute hypoxic  respiratory failure, community acquired pneumonia - BNP was elevated at 296 with chest x-ray showing right lung opacity may reflect atelectasis or possibility of pneumonia. Cardiomegaly but no convincing pulmonary edema. - Patient received Lasix in ED. Patient reporting significant cough and productive phlegm, placed on Levaquin, Mucinex, Robitussin  Acute diastolic CHF - Per patient's family, she has been taking Lasix for a long time for peripheral edema however she did not have any prior echo. She was being worked up for CHF outpatient. 2-D echo was done which showed preserved EF however has grade 2 diastolic dysfunction. - She received IV Lasix in patient and will continue outpatient dose of Lasix 40mg  TID at discharge.   Mechanical fall - PTOT consult recommended home health PT  Hypokalemia - Resolved with supplementation  Essential hypertension - Currently stable,  continue Tenormin (changing to metoprolol once she runs out of her current supply) -Discontinued hydrochlorothiazide and Lasix dosing (concern for renal stress)  Hyperglycemia - No listed history of diabetes mellitus, will check hemoglobin A1c    11/03/2016 1st [facility] office visit/ Bianca Matthews   Chief Complaint  Patient presents with  . Pulmonary Consult    Referred by Dr. Hulan Fess, Pt. here today c/o increase sob with exertion, Pt.  here today to discuss if she needs to be on oxygen as needed or 24/7   doe since d/c = MMRC3 = can't walk 100 yards even at a slow pace at a flat grade s stopping due to sob  Even on 02  Leg swelling x years/ no better on lasix  Breathing was not really limiting her prior to her admit but she was becoming much more sedentary per her son pta rec Please remember to go to the lab and x-ray department downstairs in the basement  for your tests - we will call you with the results when they are available. Goal with 02 is to keep  over sat 90% at all times Continue 02 2lpm  24/7 but ok to turn up if needed to maintain over 90%  Aldactone 25 mg twice daily   Please schedule a follow up office visit in  2 weeks, sooner if needed with all active medications in hand    12/15/16   NP / med calendar Changed spironolactone to 25 mg each am and lasix down to 40 mg twice daily     01/26/2017  f/u ov/Bianca [place] re: hypercarbic resp failure c/w ohs/ Grade II diastolic dysfunction / noct 02 dep only  Chief Complaint  Patient presents with  . Follow-up    Breathing is still doing well. She has minimal cough with clear sputum.    Walking up to 5-10 min then stop due to feet not sob  Main complaint is longstanding daily watery rhinitis no better or worse since  off immunotherapy 10/2016   No obvious day to day or daytime variability or assoc excess/ purulent sputum or mucus plugs or hemoptysis or cp or chest tightness, subjective wheeze or overt sinus or hb symptoms. No unusual exp hx or h/o childhood pna/ asthma or knowledge of premature birth.  Sleeping ok without nocturnal  or early am exacerbation  of respiratory  c/o's or need for noct saba. Also denies any obvious fluctuation of symptoms with weather or environmental changes or other aggravating or alleviating factors except as outlined above   Current Medications, Allergies, Complete Past Medical History, Past Surgical History, Family History, and Social History were reviewed in [employer] record.  ROS  The following are not active complaints unless bolded sore throat, dysphagia, dental problems, itching, sneezing,  nasal congestion or excess/ purulent secretions, ear ache,   fever, chills, sweats, unintended wt loss, classically pleuritic or exertional cp,  orthopnea pnd or leg swelling, presyncope, palpitations, abdominal pain, anorexia, nausea, vomiting, diarrhea  or change in bowel or bladder habits, change in stools or urine, dysuria,hematuria,  rash, arthralgias, visual complaints, headache,  numbness, weakness or ataxia or problems with walking or coordination,  change in mood/affect or memory.                      Objective:   Physical Exam  amb stoic wf still lets her son do most of  the talking    01/26/2017  182   11/17/2016       188   11/03/16 194 lb 9.6 oz (88.3 kg)  10/31/16 191 lb (86.6 kg)  10/27/16 195 lb 9.6 oz (88.7 kg)    Vital signs reviewed - Note on arrival 02 sats  96% on 2lpm      HEENT: nl dentition, turbinates, and oropharynx. Nl external ear canals without cough reflex   NECK :  without JVD/Nodes/TM/ nl carotid upstrokes bilaterally   LUNGS: no acc muscle use,  Nl contour chest with moderately distant bs bilaterally but no wheeze    CV:  RRR  no s3 or murmur or increase in P2,  And  Trace   pitting  lower ext sym  Bilaterally   ABD:  soft and nontender with nl inspiratory excursion in the supine position. No bruits or organomegaly appreciated, bowel sounds nl  MS:  Nl gait/ ext warm without deformities, calf tenderness, cyanosis or clubbing No obvious joint restrictions   SKIN: warm and dry without lesions    NEURO:  alert, approp, nl sensorium with  no motor or cerebellar deficits apparent.     CXR PA and Lateral:   11/03/2016 :    I personally reviewed images and agree with radiology impression as follows:   1.  Low lung volumes with bibasilar atelectasis. 2. Cardiomegaly.  No pulmonary venous congestion .    Labs ordered/ reviewed:      Chemistry      Component Value Date/Time   NA 133 (L) 01/26/2017 1015   K 4.6 01/26/2017 1015   CL 95 (L) 01/26/2017 1015   CO2 30 01/26/2017 1015   BUN 29 (H) 01/26/2017 1015   CREATININE 0.99 01/26/2017 1015      Component Value Date/Time   CALCIUM 10.2 01/26/2017 1015   ALKPHOS 48 12/02/2015 0938   AST 18 12/02/2015 0938   ALT 17 12/02/2015 0938   BILITOT 0.7 12/02/2015 0938        Lab Results  Component Value Date   WBC 6.9 01/26/2017   HGB 12.7 01/26/2017    HCT 37.7 01/26/2017   MCV 91.6 01/26/2017   PLT 275.0 01/26/2017       EOS                       0.2                                                                     01/26/2017    .     Assessment & Plan:

## 2017-01-26 NOTE — Patient Instructions (Addendum)
dymista one twice daily each nostril - cross off the list if don't think it's helping your drippy nose   Please remember to go to the lab  department downstairs in the basement  for your tests - we will call you with the results when they are available.  See calendar for specific medication instructions and bring it back for each and every office visit for every healthcare provider you see.  Without it,  you may not receive the best quality medical care that we feel you deserve.  You will note that the calendar groups together  your maintenance  medications that are timed at particular times of the day.  Think of this as your checklist for what your doctor has instructed you to do until your next evaluation to see what benefit  there is  to staying on a consistent group of medications intended to keep you well.  The other group at the bottom is entirely up to you to use as you see fit  for specific symptoms that may arise between visits that require you to treat them on an as needed basis.  Think of this as your action plan or "what if" list.   Separating the top medications from the bottom group is fundamental to providing you adequate care going forward.     Please schedule a follow up visit in 3 months but call sooner if needed

## 2017-01-28 ENCOUNTER — Encounter: Payer: Self-pay | Admitting: Internal Medicine

## 2017-01-28 NOTE — Assessment & Plan Note (Addendum)
Echo 10/10/16 Left ventricle: The cavity size was normal. There was moderate   concentric hypertrophy. Systolic function was normal. The   estimated ejection fraction was in the range of 60% to 65%. Wall   motion was normal; there were no regional wall motion   abnormalities. Features are consistent with a pseudonormal left   ventricular filling pattern, with concomitant abnormal relaxation   and increased filling pressure (grade 2 diastolic dysfunction).   Doppler parameters are consistent with high ventricular filling   pressure. - Aortic valve: There was very mild stenosis.   - Mitral valve: There was mild regurgitation. - Atrial septum: There was increased thickness of the septum,   consistent with lipomatous hypertrophy. - Pulmonary arteries: PA peak pressure: 33 mm Hg (S). Spirometry 11/03/2016  Purely restrictive   Much better on present diruretic regimen which she is tolerating well   I had an extended discussion with the patient reviewing all relevant studies completed to date and  lasting 15 to 20 minutes of a 25 minute visit    Each maintenance medication was reviewed in detail including most importantly the difference between maintenance and prns and under what circumstances the prns are to be triggered using an action plan format that is not reflected in the computer generated alphabetically organized AVS but trather by a customized med calendar that reflects the AVS meds with confirmed 100% correlation.   In addition, Please see AVS for unique instructions that I personally wrote and verbalized to the the pt in detail and then reviewed with pt  by my nurse highlighting any  changes in therapy recommended at today's visit to their plan of care.

## 2017-01-28 NOTE — Assessment & Plan Note (Signed)
On 02 since d/c 10/11/16 - HCO3  11/03/2016  = 35  - ONO RA   01/23/17   desat < 89% x 2h 9mn > rec continue noct 02 2lpm hs only

## 2017-01-28 NOTE — Assessment & Plan Note (Signed)
Body mass index is 31.24 kg/m.  -  trending down with diuresis/ encouraged  Lab Results  Component Value Date   TSH 2.98 11/03/2016     Contributing to gerd risk/ doe/reviewed the need and the process to achieve and maintain neg calorie balance > defer f/u primary care including intermittently monitoring thyroid status

## 2017-01-28 NOTE — Assessment & Plan Note (Signed)
Trial of dymista 01/26/2017

## 2017-01-29 LAB — RESPIRATORY ALLERGY PANEL REGION II W/ RFLX: ~~LOC~~
Allergen, A. alternata, m6: <0.1 kU/L
Allergen, C. Herbarum, M2: <0.1 kU/L
Allergen, Cedar tree, t12: <0.1 kU/L
Allergen, Comm Silver Birch, t9: <0.1 kU/L
Allergen, Cottonwood, t14: <0.1 kU/L
Allergen, D pternoyssinus,d7: <0.1 kU/L
Allergen, Mouse Urine Protein, e78: <0.1 kU/L
Allergen, Mulberry, t76: <0.1 kU/L
Allergen, Oak,t7: <0.1 kU/L
Allergen, P. notatum, m1: <0.1 kU/L
Aspergillus fumigatus, m3: <0.1 kU/L
Bermuda Grass: <0.1 kU/L
Box Elder IgE: <0.1 kU/L
Cat Dander: <0.1 kU/L
Cockroach: <0.1 kU/L
Common Ragweed: <0.1 kU/L
D. farinae: <0.1 kU/L
Dog Dander: <0.1 kU/L
Elm IgE: <0.1 kU/L
IgE (Immunoglobulin E), Serum: 47 kU/L
Johnson Grass: <0.1 kU/L
Pecan/Hickory Tree IgE: <0.1 kU/L
Rough Pigweed  IgE: <0.1 kU/L
Sheep Sorrel IgE: <0.1 kU/L
Timothy Grass: <0.1 kU/L

## 2017-01-29 NOTE — Progress Notes (Signed)
Spoke with pt and notified of results per Dr. Wert. Pt verbalized understanding and denied any questions. 

## 2017-02-08 ENCOUNTER — Telehealth: Payer: Self-pay | Admitting: Internal Medicine

## 2017-02-08 DIAGNOSIS — J9611 Chronic respiratory failure with hypoxia: Secondary | ICD-10-CM

## 2017-02-08 DIAGNOSIS — R0902 Hypoxemia: Secondary | ICD-10-CM

## 2017-02-08 DIAGNOSIS — J9612 Chronic respiratory failure with hypercapnia: Secondary | ICD-10-CM

## 2017-02-08 NOTE — Telephone Encounter (Signed)
Spoke with patient, states that she was given O2 24/7 back in January through [facility] Pt states that in April she was told that she only needs O2 at night.  Need to know from [facility] what all they need sent over.    Per last notes - ONO RA   01/23/17   desat < 89% x 2h 101mn > rec continue noct 02 2lpm hs only   [facility]

## 2017-02-09 NOTE — Telephone Encounter (Signed)
MW  Please Advise-  Spoke with pt and informed her of your message. Pt has decied to hold off on ordering oxygen for the daytime and states she does has a way to monitor her oxygen level. She is requesting if we can place an order for a smaller concentrator at night.

## 2017-02-09 NOTE — Telephone Encounter (Signed)
Spoke with [facility]-they have order for patient having O2 24/7 and needs a new updated order under MW since he will be taking care of patient's O2 needs.    MW please confirm order to send to [facility] for O2 use on patient.

## 2017-02-09 NOTE — Telephone Encounter (Signed)
Looks like my mistake should stay on 2lpm 24/ 7 unless able to monitor sats during the day sitting and walking and be sure she stays > 90% and if so then just use the 02 hs only

## 2017-02-09 NOTE — Telephone Encounter (Signed)
Called and spoke to the pt and she is aware of MW recs.  Order has been placed to [facility] and nothing further is needed.

## 2017-02-09 NOTE — Telephone Encounter (Signed)
Ok to request but not sure that's available

## 2017-02-12 ENCOUNTER — Telehealth: Payer: Self-pay | Admitting: Internal Medicine

## 2017-02-12 NOTE — Telephone Encounter (Signed)
Called and spoke with Bianca Matthews from [facility] and he stated that the pt would not be able to get portable since she is nocturnal only. Nothing further is needed.

## 2017-02-13 ENCOUNTER — Telehealth: Payer: Self-pay | Admitting: Internal Medicine

## 2017-02-13 NOTE — Telephone Encounter (Signed)
Order already placed and so this should have been routed to [facility]  Will route to them now

## 2017-02-13 NOTE — Telephone Encounter (Signed)
Spoke to pt she is aware it takes a few days for [facility] to post the orders on there website they do have this order and will be calling her within a few days Joellen Jersey

## 2017-02-22 ENCOUNTER — Telehealth: Payer: Self-pay | Admitting: Adult Health

## 2017-02-22 DIAGNOSIS — J9611 Chronic respiratory failure with hypoxia: Secondary | ICD-10-CM

## 2017-02-22 DIAGNOSIS — J9612 Chronic respiratory failure with hypercapnia: Principal | ICD-10-CM

## 2017-02-22 NOTE — Telephone Encounter (Signed)
Pt aware that we are placing order for d/c os daytime O2.  Pt aware that she has not heard from [facility] by mid next week to give them a call. Nothing further needed.

## 2017-02-22 NOTE — Telephone Encounter (Signed)
Fine to cancel all but hs 02

## 2017-02-22 NOTE — Telephone Encounter (Signed)
Pt calling regarding a test that she had back in March - pt states that this was about O2 Pt has been using O2 only at night as directed since March and she is wanting all the O2 picked up other than what she is using at night. Pt states that her O2 is reading 98-99%.   Please advise MW if okay to d/c daytime O2 and [facility] to pick up. Thanks.

## 2017-02-27 ENCOUNTER — Encounter: Payer: Self-pay | Admitting: Family Medicine

## 2017-02-27 ENCOUNTER — Ambulatory Visit (INDEPENDENT_AMBULATORY_CARE_PROVIDER_SITE_OTHER): Payer: Medicare PPO | Admitting: Family Medicine

## 2017-02-27 VITALS — BP 129/74 | HR 57 | Ht 64.0 in | Wt 179.0 lb

## 2017-02-27 DIAGNOSIS — J019 Acute sinusitis, unspecified: Secondary | ICD-10-CM

## 2017-02-27 MED ORDER — AMOXICILLIN-POT CLAVULANATE 875-125 MG PO TABS: 1.0000 | ORAL_TABLET | Freq: Two times a day (BID) | ORAL | 0 refills | Status: DC

## 2017-02-27 NOTE — Progress Notes (Signed)
   Subjective:    Patient ID: Florestine Avers, female    DOB: [birth year], 83 y.o.   MRN: [id number]  HPI Here for one week of sinus pressure, PND, and a dry cough. No fever. Using Tussin.    Review of Systems  Constitutional: Negative.   HENT: Positive for congestion, postnasal drip, sinus pain and sinus pressure. Negative for sore throat.   Eyes: Negative.   Respiratory: Positive for cough.        Objective:   Physical Exam  Constitutional: She appears well-developed and well-nourished.  HENT:  Right Ear: External ear normal.  Left Ear: External ear normal.  Nose: Nose normal.  Mouth/Throat: Oropharynx is clear and moist.  Eyes: Conjunctivae are normal.  Neck: No thyromegaly present.  Pulmonary/Chest: Effort normal. No respiratory distress. She has no wheezes.  Faint bibasilar rales   Musculoskeletal:  2+edema in both feet and ankles   Lymphadenopathy:    She has no cervical adenopathy.          Assessment & Plan:  Sinusitis, treat with Augmentin.  Alysia Penna, MD

## 2017-02-27 NOTE — Patient Instructions (Signed)
WE NOW OFFER   [facility] FAST TRACK!!!  SAME DAY Appointments for ACUTE CARE  Such as: Sprains, Injuries, cuts, abrasions, rashes, muscle pain, joint pain, back pain Colds, flu, sore throats, headache, allergies, cough, fever  Ear pain, sinus and eye infections Abdominal pain, nausea, vomiting, diarrhea, upset stomach Animal/insect bites  3 Easy Ways to Schedule: Walk-In Scheduling Call in scheduling Mychart Sign-up: [email or web address]         

## 2017-03-13 ENCOUNTER — Encounter: Payer: Self-pay | Admitting: Podiatry

## 2017-03-13 ENCOUNTER — Ambulatory Visit (INDEPENDENT_AMBULATORY_CARE_PROVIDER_SITE_OTHER): Payer: Medicare PPO | Admitting: Podiatry

## 2017-03-13 ENCOUNTER — Telehealth: Payer: Self-pay | Admitting: Family Medicine

## 2017-03-13 DIAGNOSIS — M79676 Pain in unspecified toe(s): Secondary | ICD-10-CM | POA: Diagnosis not present

## 2017-03-13 DIAGNOSIS — B351 Tinea unguium: Secondary | ICD-10-CM | POA: Diagnosis not present

## 2017-03-13 NOTE — Progress Notes (Signed)
Patient ID: Bianca Matthews, female   DOB: [birth year], 83 y.o.   MRN: [id number] Complaint:  Visit Type: Patient returns to my office for continued preventative foot care services. Complaint: Patient states" my nails have grown long and thick and become painful to walk and wear shoes" . The patient presents for preventative foot care services. No changes to ROS  Podiatric Exam: Vascular: dorsalis pedis and posterior tibial pulses are palpable bilateral. Capillary return is immediate. Temperature gradient is WNL. Skin turgor WNL  Sensorium: Normal Semmes Weinstein monofilament test. Normal tactile sensation bilaterally. Nail Exam: Pt has thick disfigured discolored nails with subungual debris noted bilateral entire nail hallux  Ulcer Exam: There is no evidence of ulcer or pre-ulcerative changes or infection. Orthopedic Exam: Muscle tone and strength are WNL. No limitations in general ROM. No crepitus or effusions noted. Foot type and digits show no abnormalities. Bony prominences are unremarkable. Skin: No Porokeratosis. No infection or ulcers  Diagnosis:  Onychomycosis, , Pain in right toe, pain in left toes  Treatment & Plan Procedures and Treatment: Consent by patient was obtained for treatment procedures. The patient understood the discussion of treatment and procedures well. All questions were answered thoroughly reviewed. Debridement of mycotic and hypertrophic toenails, 1 through 5 bilateral and clearing of subungual debris. No ulceration, no infection noted.  Return Visit-Office Procedure: Patient instructed to return to the office for a follow up visit 3 months for continued evaluation and treatment.  Gardiner Barefoot DPM

## 2017-03-13 NOTE — Telephone Encounter (Signed)
I spoke with pt and went over below information. 

## 2017-03-13 NOTE — Telephone Encounter (Signed)
Pt saw a podiatrist today and he mention to her that she has diabetes. Pt needs clarification

## 2017-03-13 NOTE — Telephone Encounter (Signed)
She actually does not have diabetes. To my knowledge she has never been diagnosed with diabetes. She has had some borderline elevated glucoses in the past, but she has never crossed the line into diabetes. I changed this in her chart.

## 2017-03-21 ENCOUNTER — Ambulatory Visit: Payer: Medicare PPO | Admitting: Podiatry

## 2017-04-02 ENCOUNTER — Encounter: Payer: Self-pay | Admitting: Family Medicine

## 2017-04-02 ENCOUNTER — Ambulatory Visit (INDEPENDENT_AMBULATORY_CARE_PROVIDER_SITE_OTHER): Payer: Medicare PPO | Admitting: Family Medicine

## 2017-04-02 VITALS — BP 122/52 | HR 64 | Temp 97.6°F | Ht 64.0 in | Wt 176.6 lb

## 2017-04-02 DIAGNOSIS — R05 Cough: Secondary | ICD-10-CM

## 2017-04-02 DIAGNOSIS — R0981 Nasal congestion: Secondary | ICD-10-CM

## 2017-04-02 DIAGNOSIS — R059 Cough, unspecified: Secondary | ICD-10-CM

## 2017-04-02 MED ORDER — BENZONATATE 100 MG PO CAPS: 100.0000 mg | ORAL_CAPSULE | Freq: Three times a day (TID) | ORAL | 0 refills | Status: DC | PRN

## 2017-04-02 NOTE — Progress Notes (Signed)
HPI:  Acute visit for cough and nasal congestion: -started: 1 week ago, though has had intermittently for years and sees allergies and pulmonologist - does not tolerate any antihistamines or nasal sprays - reports has tried them all -symptoms:nasal congestion, PND, cough -denies:fever, SOB, NVD, tooth pain, sinus pain, ear pain -has tried: see above -sick contacts/travel/risks: no reported flu, strep or tick exposure  ROS: See pertinent positives and negatives per HPI.  Past Medical History:  Diagnosis Date  . Allergy    sees Dr. [name]  . GERD (gastroesophageal reflux disease)   . Hyperlipidemia   . Hypertension   . Varicose veins     Past Surgical History:  Procedure Laterality Date  . APPENDECTOMY    . TONSILLECTOMY      Family History  Problem Relation Age of Onset  . Hypertension Unknown   . Heart disease Unknown     Social History   Social History  . Marital status: Widowed    Spouse name: N/A  . Number of children: N/A  . Years of education: N/A   Occupational History  . retired    Social History Main Topics  . Smoking status: Former Smoker    Packs/day: 0.50    Years: 10.00  . Smokeless tobacco: Never Used  . Alcohol use 0.0 oz/week     Comment: occ  . Drug use: No  . Sexual activity: Not Asked   Other Topics Concern  . None   Social History Narrative   Retired   Married           Current Outpatient Prescriptions:  .  acetaminophen (TYLENOL) 325 MG tablet, Take 2 tablets (650 mg total) by mouth every 4 (four) hours as needed for headache or mild pain., Disp: 30 tablet, Rfl: 0 .  aspirin 325 MG tablet, Take 325 mg by mouth at bedtime. , Disp: , Rfl:  .  atenolol (TENORMIN) 100 MG tablet, Take 100 mg by mouth daily., Disp: , Rfl:  .  b complex vitamins tablet, Take 1 tablet by mouth daily., Disp: , Rfl:  .  Biotin 1 MG CAPS, Take 1 tablet by mouth daily., Disp: , Rfl:  .  co-enzyme Q-10 30 MG capsule, Take 30 mg by mouth daily., Disp: ,  Rfl:  .  dextromethorphan-guaiFENesin (MUCINEX DM) 30-600 MG 12hr tablet, Take 1 tablet by mouth 2 (two) times daily as needed for cough., Disp: , Rfl:  .  furosemide (LASIX) 40 MG tablet, Take 2 tabs by mouth in the morning, then 1 tablet my mouth in the evening, Disp: , Rfl:  .  losartan (COZAAR) 100 MG tablet, Take 1 tablet (100 mg total) by mouth daily., Disp: 90 tablet, Rfl: 3 .  Magnesium 250 MG TABS, Take 1 tablet by mouth daily., Disp: , Rfl:  .  Multiple Vitamin (MULTIVITAMIN WITH MINERALS) TABS tablet, Take 1 tablet by mouth daily. Women's One a Day, Disp: , Rfl:  .  omeprazole (PRILOSEC) 40 MG capsule, TAKE 1 CAPSULE EVERY DAY, Disp: 90 capsule, Rfl: 3 .  Probiotic Product (PROBIOTIC FORMULA PO), Take 1 tablet by mouth daily. , Disp: , Rfl:  .  rosuvastatin (CRESTOR) 10 MG tablet, Take 1 tablet (10 mg total) by mouth at bedtime. (Patient taking differently: Take 10 mg by mouth every evening. 4pm), Disp: 90 tablet, Rfl: 3 .  spironolactone (ALDACTONE) 25 MG tablet, Take 1 tablet (25 mg total) by mouth 2 (two) times daily., Disp: 180 tablet, Rfl: 3 .  benzonatate (TESSALON PERLES) 100 MG capsule, Take 1 capsule (100 mg total) by mouth 3 (three) times daily as needed., Disp: 20 capsule, Rfl: 0  EXAM:  Vitals:   04/02/17 1428  BP: (!) 122/52  Pulse: 64  Temp: 97.6 F (36.4 C)    Body mass index is 30.31 kg/m.  GENERAL: vitals reviewed and listed above, alert, oriented, appears well hydrated and in no acute distress  HEENT: atraumatic, conjunttiva clear, no obvious abnormalities on inspection of external nose and ears, normal appearance of ear canals and TMs, clear nasal congestion, mild post oropharyngeal erythema with PND, no tonsillar edema or exudate, no sinus TTP  NECK: no obvious masses on inspection  LUNGS: clear to auscultation bilaterally, no wheezes, rales or rhonchi, good air movement  CV: HRRR, no peripheral edema  MS: moves all extremities without noticeable  abnormality  PSYCH: pleasant and cooperative, no obvious depression or anxiety  ASSESSMENT AND PLAN:  Discussed the following assessment and plan:  Nasal congestion  Cough  -given HPI and exam findings today, a serious infection or illness is unlikely. We discussed potential etiologies, with VURI or allergic rhinitis being most likely. She reports can't take any allergy medications due to side effects. She wanted to try tessalon for cough as does not think she has tried this in the past. We discussed treatment side effects, likely course, antibiotic misuse, transmission, and signs of developing a serious illness. Reports she has follow up with her pulm specialist later this month. -of course, we advised to return or notify a doctor immediately if symptoms worsen or persist or new concerns arise.    Patient Instructions  INSTRUCTIONS FOR UPPER RESPIRATORY INFECTION:  -plenty of rest and fluids  -nasal saline wash 2-3 times daily (use prepackaged nasal saline or bottled/distilled water if making your own)   -if you are taking a cough medication - use only as directed, may also try a teaspoon of honey to coat the throat and throat lozenges. I sent the tessalon to the pharmacy to try for the cough.  -follow up with your specialist as planned.  -for sore throat, salt water gargles can help  -follow up if you have fevers, facial pain, tooth pain, difficulty breathing or are worsening or symptoms persist longer then expected  Upper Respiratory Infection, Adult An upper respiratory infection (URI) is also known as the common cold. It is often caused by a type of germ (virus). Colds are easily spread (contagious). You can pass it to others by kissing, coughing, sneezing, or drinking out of the same glass. Usually, you get better in 1 to 3  weeks.  However, the cough can last for even longer. HOME CARE   Only take medicine as told by your doctor. Follow instructions provided above.  Drink  enough water and fluids to keep your pee (urine) clear or pale yellow.  Get plenty of rest.  Return to work when your temperature is < 100 for 24 hours or as told by your doctor. You may use a face mask and wash your hands to stop your cold from spreading. GET HELP RIGHT AWAY IF:   After the first few days, you feel you are getting worse.  You have questions about your medicine.  You have chills, shortness of breath, or red spit (mucus).  You have pain in the face for more then 1-2 days, especially when you bend forward.  You have a fever, puffy (swollen) neck, pain when you swallow, or white spots in  the back of your throat.  You have a bad headache, ear pain, sinus pain, or chest pain.  You have a high-pitched whistling sound when you breathe in and out (wheezing).  You cough up blood.  You have sore muscles or a stiff neck. MAKE SURE YOU:   Understand these instructions.  Will watch your condition.  Will get help right away if you are not doing well or get worse. Document Released: 03/06/2008 Document Revised: 12/11/2011 Document Reviewed: 12/24/2013 [facility] Patient Information 2015 [place], [place]. This information is not intended to replace advice given to you by your health care provider. Make sure you discuss any questions you have with your health care provider.    Colin Benton R., DO

## 2017-04-02 NOTE — Patient Instructions (Signed)
INSTRUCTIONS FOR UPPER RESPIRATORY INFECTION:  -plenty of rest and fluids  -nasal saline wash 2-3 times daily (use prepackaged nasal saline or bottled/distilled water if making your own)   -if you are taking a cough medication - use only as directed, may also try a teaspoon of honey to coat the throat and throat lozenges. I sent the tessalon to the pharmacy to try for the cough.  -follow up with your specialist as planned.  -for sore throat, salt water gargles can help  -follow up if you have fevers, facial pain, tooth pain, difficulty breathing or are worsening or symptoms persist longer then expected  Upper Respiratory Infection, Adult An upper respiratory infection (URI) is also known as the common cold. It is often caused by a type of germ (virus). Colds are easily spread (contagious). You can pass it to others by kissing, coughing, sneezing, or drinking out of the same glass. Usually, you get better in 1 to 3  weeks.  However, the cough can last for even longer. HOME CARE   Only take medicine as told by your doctor. Follow instructions provided above.  Drink enough water and fluids to keep your pee (urine) clear or pale yellow.  Get plenty of rest.  Return to work when your temperature is < 100 for 24 hours or as told by your doctor. You may use a face mask and wash your hands to stop your cold from spreading. GET HELP RIGHT AWAY IF:   After the first few days, you feel you are getting worse.  You have questions about your medicine.  You have chills, shortness of breath, or red spit (mucus).  You have pain in the face for more then 1-2 days, especially when you bend forward.  You have a fever, puffy (swollen) neck, pain when you swallow, or white spots in the back of your throat.  You have a bad headache, ear pain, sinus pain, or chest pain.  You have a high-pitched whistling sound when you breathe in and out (wheezing).  You cough up blood.  You have sore muscles or a  stiff neck. MAKE SURE YOU:   Understand these instructions.  Will watch your condition.  Will get help right away if you are not doing well or get worse. Document Released: 03/06/2008 Document Revised: 12/11/2011 Document Reviewed: 12/24/2013 [facility] Patient Information 2015 [place], [place]. This information is not intended to replace advice given to you by your health care provider. Make sure you discuss any questions you have with your health care provider.

## 2017-04-05 ENCOUNTER — Other Ambulatory Visit: Payer: Self-pay | Admitting: Family Medicine

## 2017-04-06 NOTE — Telephone Encounter (Signed)
Can we refill Lasix? Pt is due for cholesterol level.

## 2017-04-19 ENCOUNTER — Encounter: Payer: Self-pay | Admitting: Family Medicine

## 2017-04-19 ENCOUNTER — Ambulatory Visit (INDEPENDENT_AMBULATORY_CARE_PROVIDER_SITE_OTHER): Payer: Medicare PPO | Admitting: Family Medicine

## 2017-04-19 VITALS — BP 104/50 | HR 65 | Temp 97.7°F | Ht 64.0 in | Wt 174.6 lb

## 2017-04-19 DIAGNOSIS — M791 Myalgia, unspecified site: Secondary | ICD-10-CM

## 2017-04-19 DIAGNOSIS — Z634 Disappearance and death of family member: Secondary | ICD-10-CM

## 2017-04-19 DIAGNOSIS — J3089 Other allergic rhinitis: Secondary | ICD-10-CM | POA: Diagnosis not present

## 2017-04-19 DIAGNOSIS — R0982 Postnasal drip: Secondary | ICD-10-CM | POA: Diagnosis not present

## 2017-04-19 NOTE — Patient Instructions (Signed)
BEFORE YOU LEAVE: -follow up: with PCP in 2-3 weeks  I am so so very sorry about your loss.   Start zyrtec nightly.  See allergist as planned.  For muscle pains can try tiger balm (topical), heat and would suggest starting gentle regular exercise - in water if possible.  I hope you are feeling better soon! Seek [facility]orner if worsening or new concerns.

## 2017-04-19 NOTE — Progress Notes (Signed)
HPI:  Acute visit for:  Nasal congestion: -chronic, several months -clear rhinorrhea, PND, sneezing, cough - treated for sinusitis earlier this summer -used to see allergist for shots, but has not in 6 months -no fevers, SOB, DOE  Chronic muscle soreness: -used to see chiropractor, but then stopped going and aches and pains returned -can have soreness in back, arms, shoulder muscles at times -no fevers, swelling, redness, trauma -saw chiropractor yesterday and now feeling better  Bereavement: -son passed early July -sad, but coping - getting counseling -feels is doing ok considering   ROS: See pertinent positives and negatives per HPI.  Past Medical History:  Diagnosis Date  . Allergy    sees Dr. [name]  . GERD (gastroesophageal reflux disease)   . Hyperlipidemia   . Hypertension   . Varicose veins     Past Surgical History:  Procedure Laterality Date  . APPENDECTOMY    . TONSILLECTOMY      Family History  Problem Relation Age of Onset  . Hypertension Unknown   . Heart disease Unknown     Social History   Social History  . Marital status: Widowed    Spouse name: N/A  . Number of children: N/A  . Years of education: N/A   Occupational History  . retired    Social History Main Topics  . Smoking status: Former Smoker    Packs/day: 0.50    Years: 10.00  . Smokeless tobacco: Never Used  . Alcohol use 0.0 oz/week     Comment: occ  . Drug use: No  . Sexual activity: Not Asked   Other Topics Concern  . None   Social History Narrative   Retired   Married           Current Outpatient Prescriptions:  .  acetaminophen (TYLENOL) 325 MG tablet, Take 2 tablets (650 mg total) by mouth every 4 (four) hours as needed for headache or mild pain., Disp: 30 tablet, Rfl: 0 .  aspirin 325 MG tablet, Take 325 mg by mouth at bedtime. , Disp: , Rfl:  .  atenolol (TENORMIN) 100 MG tablet, Take 100 mg by mouth daily., Disp: , Rfl:  .  b complex vitamins tablet,  Take 1 tablet by mouth daily., Disp: , Rfl:  .  benzonatate (TESSALON PERLES) 100 MG capsule, Take 1 capsule (100 mg total) by mouth 3 (three) times daily as needed., Disp: 20 capsule, Rfl: 0 .  Biotin 1 MG CAPS, Take 1 tablet by mouth daily., Disp: , Rfl:  .  co-enzyme Q-10 30 MG capsule, Take 30 mg by mouth daily., Disp: , Rfl:  .  dextromethorphan-guaiFENesin (MUCINEX DM) 30-600 MG 12hr tablet, Take 1 tablet by mouth 2 (two) times daily as needed for cough., Disp: , Rfl:  .  furosemide (LASIX) 40 MG tablet, TAKE 1 TABLET THREE TIMES DAILY, Disp: 270 tablet, Rfl: 3 .  losartan (COZAAR) 100 MG tablet, Take 1 tablet (100 mg total) by mouth daily., Disp: 90 tablet, Rfl: 3 .  Magnesium 250 MG TABS, Take 1 tablet by mouth daily., Disp: , Rfl:  .  Multiple Vitamin (MULTIVITAMIN WITH MINERALS) TABS tablet, Take 1 tablet by mouth daily. Women's One a Day, Disp: , Rfl:  .  omeprazole (PRILOSEC) 40 MG capsule, TAKE 1 CAPSULE EVERY DAY, Disp: 90 capsule, Rfl: 3 .  Probiotic Product (PROBIOTIC FORMULA PO), Take 1 tablet by mouth daily. , Disp: , Rfl:  .  rosuvastatin (CRESTOR) 10 MG tablet, TAKE 1 TABLET AT  BEDTIME, Disp: 90 tablet, Rfl: 3 .  spironolactone (ALDACTONE) 25 MG tablet, Take 1 tablet (25 mg total) by mouth 2 (two) times daily., Disp: 180 tablet, Rfl: 3  EXAM:  Vitals:   04/19/17 1619  BP: (!) 104/50  Pulse: 65  Temp: 97.7 F (36.5 C)    Body mass index is 29.97 kg/m.  GENERAL: vitals reviewed and listed above, alert, oriented, appears well hydrated and in no acute distress  HEENT: atraumatic, allergic shiners no obvious abnormalities on inspection of external nose and ears, normal appearance of ear canals and TMs, clear nasal congestion, boggy turbinates, mild post oropharyngeal erythema with PND, no tonsillar edema or exudate, no sinus TTP  NECK: no obvious masses on inspection  LUNGS: clear to auscultation bilaterally, no wheezes, rales or rhonchi, good air movement  CV: HRRR,  no peripheral edema  MS: moves all extremities without noticeable abnormality, gait normal  PSYCH: pleasant and cooperative, no obvious depression or anxiety  ASSESSMENT AND PLAN:  Discussed the following assessment and plan:  Non-seasonal allergic rhinitis, unspecified trigger PND (post-nasal drip) -given ongoing symptoms and hx, suspect allergies -not taking anything for allergies, doesn't want to use nasal sprays -trial zyrtec at night and may help sleep as well -advised f/u with allergist  Bereavement Muscle soreness -conservative care per pt instructions, she plans to continue chiropractic as she feels it really helped -she is getting bereavement counseling and feels is coping well -improved sleep and treating allergies may help too -follow up with PCP advised in a few weeks to ensure doing ok  -Patient advised to return or notify a doctor immediately if symptoms worsen or persist or new concerns arise.  Patient Instructions  BEFORE YOU LEAVE: -follow up: with PCP in 2-3 weeks  I am so so very sorry about your loss.   Start zyrtec nightly.  See allergist as planned.  For muscle pains can try tiger balm (topical), heat and would suggest starting gentle regular exercise - in water if possible.  I hope you are feeling better soon! Seek soorner if worsening or new concerns.      Colin Benton R., DO

## 2017-04-24 ENCOUNTER — Ambulatory Visit (INDEPENDENT_AMBULATORY_CARE_PROVIDER_SITE_OTHER): Payer: Medicare PPO | Admitting: Family Medicine

## 2017-04-24 VITALS — BP 134/65 | HR 64 | Ht 64.0 in | Wt 174.0 lb

## 2017-04-24 DIAGNOSIS — M129 Arthropathy, unspecified: Secondary | ICD-10-CM

## 2017-04-24 DIAGNOSIS — J301 Allergic rhinitis due to pollen: Secondary | ICD-10-CM

## 2017-04-24 MED ORDER — DICLOFENAC SODIUM 75 MG PO TBEC: 75.0000 mg | DELAYED_RELEASE_TABLET | Freq: Two times a day (BID) | ORAL | 2 refills | Status: DC

## 2017-04-24 NOTE — Patient Instructions (Signed)
WE NOW OFFER   [facility] FAST TRACK!!!  SAME DAY Appointments for ACUTE CARE  Such as: Sprains, Injuries, cuts, abrasions, rashes, muscle pain, joint pain, back pain Colds, flu, sore throats, headache, allergies, cough, fever  Ear pain, sinus and eye infections Abdominal pain, nausea, vomiting, diarrhea, upset stomach Animal/insect bites  3 Easy Ways to Schedule: Walk-In Scheduling Call in scheduling Mychart Sign-up: [email or web address]         

## 2017-04-25 ENCOUNTER — Encounter: Payer: Self-pay | Admitting: Family Medicine

## 2017-04-25 NOTE — Progress Notes (Signed)
   Subjective:    Patient ID: Bianca [facility], female    DOB: 12/22/1927, 83 y.o.   MRN: [id number]  HPI Here for 2 issues. First she has been having a lot of joint pains for the past year. She sees a Restaurant manager, fast food for adjustments of her spine but she still has stiffness and pain. She has pain in both shoulders, the right hip, and the right knee. She has received a steroid shot in the right knee with mixed results. She has tried Aleve and Ibuprofen with mixed results. Second she has struggled with itchy eyes, sneezing, PND, and a dry cough for several months. No fever. Zyrtec helps but it makes her sleepy.    Review of Systems  Constitutional: Negative.   HENT: Positive for postnasal drip, rhinorrhea and sneezing. Negative for congestion, sinus pain, sinus pressure and sore throat.   Eyes: Negative.   Respiratory: Positive for cough.   Musculoskeletal: Positive for arthralgias.       Objective:   Physical Exam  Constitutional: She is oriented to person, place, and time. She appears well-developed and well-nourished.  HENT:  Right Ear: External ear normal.  Left Ear: External ear normal.  Nose: Nose normal.  Mouth/Throat: Oropharynx is clear and moist.  Eyes: Conjunctivae are normal.  Neck: No thyromegaly present.  Pulmonary/Chest: Effort normal and breath sounds normal. No respiratory distress. She has no wheezes. She has no rales.  Musculoskeletal: Normal range of motion. She exhibits no edema.  Lymphadenopathy:    She has no cervical adenopathy.  Neurological: She is alert and oriented to person, place, and time.          Assessment & Plan:  She has widespread osteoporosis, so she will try Diclofenac bid. She has allergies so she will try Xyzal daily.  Alysia Penna, MD

## 2017-04-27 ENCOUNTER — Encounter: Payer: Self-pay | Admitting: Internal Medicine

## 2017-04-27 ENCOUNTER — Ambulatory Visit (INDEPENDENT_AMBULATORY_CARE_PROVIDER_SITE_OTHER): Payer: Medicare PPO | Admitting: Internal Medicine

## 2017-04-27 VITALS — BP 132/78 | HR 55 | Temp 97.4°F | Ht 64.0 in | Wt 172.0 lb

## 2017-04-27 DIAGNOSIS — J9611 Chronic respiratory failure with hypoxia: Secondary | ICD-10-CM

## 2017-04-27 DIAGNOSIS — J9612 Chronic respiratory failure with hypercapnia: Secondary | ICD-10-CM

## 2017-04-27 DIAGNOSIS — J309 Allergic rhinitis, unspecified: Secondary | ICD-10-CM

## 2017-04-27 NOTE — Assessment & Plan Note (Signed)
On 02 since d/c 10/11/16 - HCO3  11/03/2016  = 35  - ONO RA   01/23/17   desat < 89% x 2h 89min > rec continue noct 02 2lpm hs > pt canceled all 02  - HC03   01/26/17  = 30    This problem has resolved and note she has lost wt which is encouraging > pulmonary f/u is prn

## 2017-04-27 NOTE — Assessment & Plan Note (Signed)
No better/ worse on immunotherapy > d/c'd 10/2016  - Trial of dymista 01/26/2017 > did not try as of 04/27/2017 > referred back to [facility] as she said his med worked - Allergy profile  01/26/17  >  Eos 0.2 /  IgE  47 neg RAST   No further pulmonary f/u for this problem

## 2017-04-27 NOTE — Patient Instructions (Addendum)
dymista one twice daily each nostril -  Set up follow up with DR [name] and let him know if this helped  If you are satisfied with your treatment plan,  let your doctor know and he/she can either refill your medications or you can return here when your prescription runs out.     If in any way you are not 100% satisfied,  please tell us.  If 100% better, tell your friends!  Pulmonary follow up is as needed

## 2017-04-27 NOTE — Progress Notes (Signed)
Subjective:    Patient ID: Bianca [facility], female    DOB: [birth year]     MRN: [id number]    Brief patient profile:  66   [place] from the [place] quit smoking 1970s with problems with upper resp  allergies in [place] prior to arrival in in [place] in 2002 and worse ever since here but never dx of copd/asthma then admit and on 02 since:   No better on immunotherapy which she stopped in Jan 2018     Admit date: 10/08/2016 Discharge date: 10/11/2016  Primary Care Physician:  Alysia Penna, MD  Discharge Diagnoses:    . Skull fracture with concussion ([name])   Acute hypoxic respiratory failure . acute Grade 2 diastolic CHF . Hypokalemia . Mechanical fall . Essential hypertension   Community-acquired pneumonia right lung     Recommendations for Outpatient Follow-up:  1. DME rolling walker, 3 and 1 arranged by case management 2. Home O2 evaluation done, 3 L with exertion to be arranged by case management 3. Please repeat CBC/BMET at next visit 4. Referral has been sent to [facility] cardiology office, patient will be scheduled for appointment (i sent email message to Trish to schedule appointment)  Patient has been given the prescription of Toprol-XL by her PCP but she wants to finish the prescription of atenolol at home before she starts the Toprol-XL.   DIET: Heart healthy diet    Allergies:  No Known Allergies   DISCHARGE MEDICATIONS:     Current Discharge Medication List        START taking these medications   Details  acetaminophen (TYLENOL) 325 MG tablet Take 2 tablets (650 mg total) by mouth every 4 (four) hours as needed for headache or mild pain. Qty: 30 tablet, Refills: 0    guaiFENesin (MUCINEX) 600 MG 12 hr tablet Take 2 tablets (1,200 mg total) by mouth 2 (two) times daily. Qty: 60 tablet, Refills: 0    guaiFENesin-dextromethorphan (ROBITUSSIN DM) 100-10 MG/5ML syrup Take 5 mLs by mouth every 4 (four) hours as needed for cough. Qty: 118 mL, Refills: 0      levofloxacin (LEVAQUIN) 750 MG tablet Take 1 tablet (750 mg total) by mouth every other day. Qty: 5 tablet, Refills: 0    losartan (COZAAR) 100 MG tablet Take 1 tablet (100 mg total) by mouth daily. Qty: 30 tablet, Refills: 4    meclizine (ANTIVERT) 12.5 MG tablet Take 1 tablet (12.5 mg total) by mouth 3 (three) times daily as needed for dizziness. Also available over the counter Qty: 90 tablet, Refills: 2    ondansetron (ZOFRAN ODT) 4 MG disintegrating tablet Take 1 tablet (4 mg total) by mouth every 8 (eight) hours as needed for nausea or vomiting. Qty: 20 tablet, Refills: 0          CONTINUE these medications which have CHANGED   Details  metoprolol succinate (TOPROL-XL) 100 MG 24 hr tablet Take 1 tablet (100 mg total) by mouth daily. Take with or immediately following a meal. Start after atenolol is finished. Qty: 90 tablet, Refills: 3          CONTINUE these medications which have NOT CHANGED   Details  Ascorbic Acid (VITAMIN C PO) Take 1 tablet by mouth daily.    aspirin 325 MG tablet Take 325 mg by mouth every evening. 4pm    atenolol (TENORMIN) 100 MG tablet Take 100 mg by mouth 2 (two) times daily.    co-enzyme Q-10 30 MG capsule Take 30 mg  by mouth daily.    furosemide (LASIX) 40 MG tablet Take 1 tablet (40 mg total) by mouth 3 (three) times daily. Qty: 270 tablet, Refills: 3    Multiple Vitamin (MULTIVITAMIN WITH MINERALS) TABS tablet Take 1 tablet by mouth daily. Women's One a Day    omeprazole (PRILOSEC) 40 MG capsule TAKE 1 CAPSULE EVERY DAY Qty: 90 capsule, Refills: 3    potassium chloride SA (K-DUR,KLOR-CON) 20 MEQ tablet TAKE 1 TABLET EVERY DAY Qty: 90 tablet, Refills: 3    PRESCRIPTION MEDICATION Inject into the skin every Monday. Weekly allergy injections done at Dr. Renne Musca office    Probiotic Product (PROBIOTIC FORMULA PO) Take 1 tablet by mouth daily.     rosuvastatin (CRESTOR) 10 MG tablet Take 1 tablet (10 mg total) by  mouth at bedtime. Qty: 90 tablet, Refills: 3         STOP taking these medications     losartan-hydrochlorothiazide (HYZAAR) 100-12.5 MG tablet          Brief H and P: For complete details please refer to admission H and P, but in brief Patient is a 83 year old female with hypertension, hyperlipidemia, GERD presented after a mechanical fall. She hit her head hard on the concrete with subsequent dizziness, nausea and concussion after the fall. In the ER, patient was noted to be in mild respiratory distress with hypoxia and she reported having episodes of shortness of breath, dyspnea on exertion. She was diagnosed with bronchitis on 10/12 and finished a course of a Zithromax.  CT head and cervical spine was significant for a nondisplaced and non-depressed right parietal toe occipital skull fracture. No head bleed or cervical fracture.  Patient was noted to have O2 sats of 84% in ED and was placed on supplemental O2. Patient was admitted for fall/head injury, concussion and acute hypoxic respiratory failure.   Hospital Course:  Skull fracture with concussion (HCC)With dizziness - CT head and cervical spine was significant for a nondisplaced and non-depressed right parietal toe occipital skull fracture. No head bleed or cervical fracture.  - Patient had significant dizziness on getting up, possibly had syncopal episode that led to the fall however patient could not recall the events. Lives alone - PTOT consult recommended home health PT - Initially placed on meclizine scheduled due to significant dizziness, Zofran as needed.  - PTOT evaluation recommended 24 7 supervision. Patient feels a lot improved today. I spoke with Dr. Kary Kos, neurosurgery who did not feel patient needed any neurosurgical intervention as this is expected post concussive symptoms. However patient can see him in the office as needed if she has long protracted course of her symptoms.  Acute hypoxic  respiratory failure, community acquired pneumonia - BNP was elevated at 296 with chest x-ray showing right lung opacity may reflect atelectasis or possibility of pneumonia. Cardiomegaly but no convincing pulmonary edema. - Patient received Lasix in ED. Patient reporting significant cough and productive phlegm, placed on Levaquin, Mucinex, Robitussin  Acute diastolic CHF - Per patient's family, she has been taking Lasix for a long time for peripheral edema however she did not have any prior echo. She was being worked up for CHF outpatient. 2-D echo was done which showed preserved EF however has grade 2 diastolic dysfunction. - She received IV Lasix in patient and will continue outpatient dose of Lasix 40mg  TID at discharge.   Mechanical fall - PTOT consult recommended home health PT  Hypokalemia - Resolved with supplementation  Essential hypertension - Currently stable,  continue Tenormin (changing to metoprolol once she runs out of her current supply) -Discontinued hydrochlorothiazide and Lasix dosing (concern for renal stress)  Hyperglycemia - No listed history of diabetes mellitus, will check hemoglobin A1c    11/03/2016 1st [facility] office visit/ Bianca [facility]   Chief Complaint  Patient presents with  . Pulmonary Consult    Referred by Dr. Hulan Fess, Pt. here today c/o increase sob with exertion, Pt.  here today to discuss if she needs to be on oxygen as needed or 24/7   doe since d/c = MMRC3 = can't walk 100 yards even at a slow pace at a flat grade s stopping due to sob  Even on 02  Leg swelling x years/ no better on lasix  Breathing was not really limiting her prior to her admit but she was becoming much more sedentary per her son pta rec Please remember to go to the lab and x-ray department downstairs in the basement  for your tests - we will call you with the results when they are available. Goal with 02 is to keep  over sat 90% at all times Continue 02 2lpm  24/7 but ok to turn up if needed to maintain over 90%  Aldactone 25 mg twice daily   Please schedule a follow up office visit in  2 weeks, sooner if needed with all active medications in hand    12/15/16   NP / med calendar Changed spironolactone to 25 mg each am and lasix down to 40 mg twice daily     01/26/2017  f/u ov/Bianca [facility] re: hypercarbic resp failure c/w ohs/ Grade II diastolic dysfunction / noct 02 dep only  Chief Complaint  Patient presents with  . Follow-up    Breathing is still doing well. She has minimal cough with clear sputum.    Walking up to 5-10 min then stop due to feet not sob  Main complaint is longstanding daily watery rhinitis no better or worse since  off immunotherapy 10/2016  rec dymista one twice daily each nostril - cross off the list if don't think it's helping your drippy nose  Please remember to go to the lab  department downstairs in the basement  for your tests - we will call you with the results when they are available. See calendar for specific medication instructions   Please schedule a follow up visit in 3 months but call sooner if needed     04/27/2017  f/u ov/B[facility]ca [facility] re:   OHS/ GII diastolic dysfunction  No onger on any 02 at all/ no med calendar / no resp rx at all  Chief Complaint  Patient presents with  . Follow-up    Pt states she is coughing less. She having lots of PND and coughs up large amounts of clear sputum in the am's. She has had some sinus pressure.     still walking 5-10 min in front of house s stopping  Never tried the dymista "too many side effects"  Previously under care of [name] "his sprary worked" but has not seen him again since the nasal symptoms flared   Not limited by breathing from desired activities    No obvious day to day or daytime variability or assoc excess/ purulent sputum or mucus plugs or hemoptysis or cp or chest tightness, subjective wheeze or overt sinus or hb symptoms. No unusual exp hx or h/o childhood pna/ asthma  or knowledge of premature birth.  Sleeping ok without nocturnal  or early am  exacerbation  of respiratory  c/o's or need for noct saba. Also denies any obvious fluctuation of symptoms with weather or environmental changes or other aggravating or alleviating factors except as outlined above   Current Medications, Allergies, Complete Past Medical History, Past Surgical History, Family History, and Social History were reviewed in [employer] record.  ROS  The following are not active complaints unless bolded sore throat, dysphagia, dental problems, itching, sneezing,  nasal congestion or excess/ purulent secretions, ear ache,   fever, chills, sweats, unintended wt loss, classically pleuritic or exertional cp,  orthopnea pnd or leg swelling, presyncope, palpitations, abdominal pain, anorexia, nausea, vomiting, diarrhea  or change in bowel or bladder habits, change in stools or urine, dysuria,hematuria,  rash, arthralgias, visual complaints, headache, numbness, weakness or ataxia or problems with walking or coordination,  change in mood/affect or memory.                   Objective:   Physical Exam  amb anxious wf multiple non-pulmonary issues   04/27/2017        172  01/26/2017        182   11/17/2016       188   11/03/16 194 lb 9.6 oz (88.3 kg)  10/31/16 191 lb (86.6 kg)  10/27/16 195 lb 9.6 oz (88.7 kg)    Vital signs reviewed - Note on arrival 02 sats 98% with RA    HEENT: nl dentition,  and oropharynx. Nl external ear canals without cough reflex - moderate bilateral non-specific turbinate edema    NECK :  without JVD/Nodes/TM/ nl carotid upstrokes bilaterally   LUNGS: no acc muscle use,  Nl contour chest with moderately distant bs bilaterally but no wheeze    CV:  RRR  no s3 or murmur or increase in P2,  And  Very minimal  lower ext sym  Edema   ABD:  soft and nontender with nl inspiratory excursion in the supine position. No bruits or organomegaly  appreciated, bowel sounds nl  MS:  Nl gait/ ext warm without deformities, calf tenderness, cyanosis or clubbing No obvious joint restrictions   SKIN: warm and dry without lesions    NEURO:  alert, approp, nl sensorium with  no motor or cerebellar deficits apparent.            Assessment & Plan:   Outpatient Encounter Prescriptions as of 04/27/2017  Medication Sig  . aspirin 325 MG tablet Take 325 mg by mouth at bedtime.   Marland Kitchen atenolol (TENORMIN) 100 MG tablet Take 100 mg by mouth daily.  . Biotin 1 MG CAPS Take 1 tablet by mouth daily.  Marland Kitchen co-enzyme Q-10 30 MG capsule Take 30 mg by mouth daily.  . diclofenac (VOLTAREN) 75 MG EC tablet Take 1 tablet (75 mg total) by mouth 2 (two) times daily.  . furosemide (LASIX) 40 MG tablet TAKE 1 TABLET THREE TIMES DAILY  . guaiFENesin-dextromethorphan (ROBITUSSIN DM) 100-10 MG/5ML syrup Take 5 mLs by mouth every 4 (four) hours as needed for cough.  . losartan (COZAAR) 100 MG tablet Take 1 tablet (100 mg total) by mouth daily.  . Magnesium 250 MG TABS Take 1 tablet by mouth daily.  . Multiple Vitamin (MULTIVITAMIN WITH MINERALS) TABS tablet Take 1 tablet by mouth daily. Women's One a Day  . omeprazole (PRILOSEC) 40 MG capsule TAKE 1 CAPSULE EVERY DAY  . rosuvastatin (CRESTOR) 10 MG tablet TAKE 1 TABLET AT BEDTIME  . spironolactone (ALDACTONE) 25  MG tablet Take 1 tablet (25 mg total) by mouth 2 (two) times daily.  . [DISCONTINUED] acetaminophen (TYLENOL) 325 MG tablet Take 2 tablets (650 mg total) by mouth every 4 (four) hours as needed for headache or mild pain.  Marland Kitchen b complex vitamins tablet Take 1 tablet by mouth daily.  . Probiotic Product (PROBIOTIC FORMULA PO) Take 1 tablet by mouth daily.   . [DISCONTINUED] benzonatate (TESSALON PERLES) 100 MG capsule Take 1 capsule (100 mg total) by mouth 3 (three) times daily as needed. (Patient not taking: Reported on 04/24/2017)  . [DISCONTINUED] dextromethorphan-guaiFENesin (MUCINEX DM) 30-600 MG 12hr  tablet Take 1 tablet by mouth 2 (two) times daily as needed for cough.   No facility-administered encounter medications on file as of 04/27/2017.

## 2017-04-30 ENCOUNTER — Ambulatory Visit (INDEPENDENT_AMBULATORY_CARE_PROVIDER_SITE_OTHER): Payer: Medicare PPO | Admitting: Family Medicine

## 2017-04-30 ENCOUNTER — Other Ambulatory Visit: Payer: Self-pay

## 2017-04-30 ENCOUNTER — Encounter: Payer: Self-pay | Admitting: Family Medicine

## 2017-04-30 VITALS — BP 128/60 | Temp 97.7°F | Ht 64.0 in | Wt 175.0 lb

## 2017-04-30 DIAGNOSIS — J018 Other acute sinusitis: Secondary | ICD-10-CM

## 2017-04-30 MED ORDER — LEVOFLOXACIN 500 MG PO TABS: 500.0000 mg | ORAL_TABLET | Freq: Every day | ORAL | 0 refills | Status: DC

## 2017-04-30 NOTE — Progress Notes (Signed)
   Subjective:    Patient ID: Bianca [facility], female    DOB: 1928/02/21, 83 y.o.   MRN: [id number]  HPI Here for 3 days of sinus pressure, PND, a ST, and a foul taste in the mouth. No fever or coughing. She was here recently with allergy type symptoms and we suggested taking Xyzal. Her current symptoms are different however.    Review of Systems  Constitutional: Negative.   HENT: Positive for congestion, postnasal drip, sinus pain, sinus pressure and sore throat.   Eyes: Negative.   Respiratory: Negative.        Objective:   Physical Exam  Constitutional: She appears well-developed and well-nourished.  HENT:  Right Ear: External ear normal.  Left Ear: External ear normal.  Nose: Nose normal.  Mouth/Throat: Oropharynx is clear and moist.  Eyes: Conjunctivae are normal.  Neck: No thyromegaly present.  Pulmonary/Chest: Effort normal and breath sounds normal. No respiratory distress. She has no wheezes. She has no rales.  Lymphadenopathy:    She has no cervical adenopathy.          Assessment & Plan:  Sinusitis, treat with Levaquin.  Alysia Penna, MD

## 2017-04-30 NOTE — Addendum Note (Signed)
Addended by: Alysia Penna A on: 04/30/2017 05:03 PM   Modules accepted: Orders

## 2017-04-30 NOTE — Patient Instructions (Signed)
WE NOW OFFER   [facility] FAST TRACK!!!  SAME DAY Appointments for ACUTE CARE  Such as: Sprains, Injuries, cuts, abrasions, rashes, muscle pain, joint pain, back pain Colds, flu, sore throats, headache, allergies, cough, fever  Ear pain, sinus and eye infections Abdominal pain, nausea, vomiting, diarrhea, upset stomach Animal/insect bites  3 Easy Ways to Schedule: Walk-In Scheduling Call in scheduling Mychart Sign-up: [email or web address]         

## 2017-05-05 ENCOUNTER — Emergency Department (HOSPITAL_COMMUNITY): Payer: Medicare PPO

## 2017-05-05 ENCOUNTER — Encounter (HOSPITAL_COMMUNITY): Payer: Self-pay | Admitting: Emergency Medicine

## 2017-05-05 ENCOUNTER — Inpatient Hospital Stay (HOSPITAL_COMMUNITY)
Admission: EM | Admit: 2017-05-05 | Discharge: 2017-05-08 | DRG: 682 | Disposition: A | Discharge status: Home Health | Payer: Medicare PPO | Attending: Internal Medicine | Admitting: Internal Medicine

## 2017-05-05 DIAGNOSIS — R410 Disorientation, unspecified: Secondary | ICD-10-CM | POA: Diagnosis not present

## 2017-05-05 DIAGNOSIS — Z66 Do not resuscitate: Secondary | ICD-10-CM | POA: Diagnosis present

## 2017-05-05 DIAGNOSIS — Z87891 Personal history of nicotine dependence: Secondary | ICD-10-CM | POA: Diagnosis not present

## 2017-05-05 DIAGNOSIS — R339 Retention of urine, unspecified: Secondary | ICD-10-CM | POA: Diagnosis present

## 2017-05-05 DIAGNOSIS — I10 Essential (primary) hypertension: Secondary | ICD-10-CM | POA: Diagnosis not present

## 2017-05-05 DIAGNOSIS — G9341 Metabolic encephalopathy: Secondary | ICD-10-CM | POA: Diagnosis present

## 2017-05-05 DIAGNOSIS — K219 Gastro-esophageal reflux disease without esophagitis: Secondary | ICD-10-CM | POA: Diagnosis present

## 2017-05-05 DIAGNOSIS — J019 Acute sinusitis, unspecified: Secondary | ICD-10-CM | POA: Diagnosis not present

## 2017-05-05 DIAGNOSIS — W19XXXA Unspecified fall, initial encounter: Secondary | ICD-10-CM

## 2017-05-05 DIAGNOSIS — N179 Acute kidney failure, unspecified: Principal | ICD-10-CM | POA: Diagnosis present

## 2017-05-05 DIAGNOSIS — E871 Hypo-osmolality and hyponatremia: Secondary | ICD-10-CM | POA: Diagnosis present

## 2017-05-05 DIAGNOSIS — Z6828 Body mass index (BMI) 28.0-28.9, adult: Secondary | ICD-10-CM

## 2017-05-05 DIAGNOSIS — I5032 Chronic diastolic (congestive) heart failure: Secondary | ICD-10-CM | POA: Diagnosis present

## 2017-05-05 DIAGNOSIS — Z7982 Long term (current) use of aspirin: Secondary | ICD-10-CM

## 2017-05-05 DIAGNOSIS — E861 Hypovolemia: Secondary | ICD-10-CM | POA: Diagnosis present

## 2017-05-05 DIAGNOSIS — I110 Hypertensive heart disease with heart failure: Secondary | ICD-10-CM | POA: Diagnosis present

## 2017-05-05 DIAGNOSIS — E785 Hyperlipidemia, unspecified: Secondary | ICD-10-CM | POA: Diagnosis present

## 2017-05-05 LAB — CBC
HCT: 33 % — ABNORMAL LOW (ref 36.0–46.0)
Hemoglobin: 12 g/dL (ref 12.0–15.0)
MCH: 31.4 pg (ref 26.0–34.0)
MCHC: 36.4 g/dL — ABNORMAL HIGH (ref 30.0–36.0)
MCV: 86.4 fL (ref 78.0–100.0)
Platelets: 313 10*3/uL (ref 150–400)
RBC: 3.82 MIL/uL — ABNORMAL LOW (ref 3.87–5.11)
RDW: 13.4 % (ref 11.5–15.5)
WBC: 8.8 10*3/uL (ref 4.0–10.5)

## 2017-05-05 LAB — BASIC METABOLIC PANEL
Anion gap: 17 — ABNORMAL HIGH (ref 5–15)
BUN: 94 mg/dL — ABNORMAL HIGH (ref 6–20)
CO2: 19 mmol/L — ABNORMAL LOW (ref 22–32)
Calcium: 9.6 mg/dL (ref 8.9–10.3)
Chloride: 87 mmol/L — ABNORMAL LOW (ref 101–111)
Creatinine, Ser: 3.46 mg/dL — ABNORMAL HIGH (ref 0.44–1.00)
GFR calc Af Amer: 13 mL/min — ABNORMAL LOW (ref >60)
GFR calc non Af Amer: 11 mL/min — ABNORMAL LOW (ref >60)
Glucose, Bld: 102 mg/dL — ABNORMAL HIGH (ref 65–99)
Potassium: 5.3 mmol/L — ABNORMAL HIGH (ref 3.5–5.1)
Sodium: 123 mmol/L — ABNORMAL LOW (ref 135–145)

## 2017-05-05 LAB — I-STAT TROPONIN, ED: Troponin i, poc: 0.01 ng/mL (ref 0.00–0.08)

## 2017-05-05 LAB — I-STAT CG4 LACTIC ACID, ED: Lactic Acid, Venous: 0.61 mmol/L (ref 0.5–1.9)

## 2017-05-05 LAB — CBG MONITORING, ED: Glucose-Capillary: 98 mg/dL (ref 65–99)

## 2017-05-05 MED ORDER — SODIUM CHLORIDE 0.9 % IV BOLUS (SEPSIS)
500.0000 mL | Freq: Once | INTRAVENOUS | Status: AC
  Administered 2017-05-05: 500 mL via INTRAVENOUS

## 2017-05-05 MED ORDER — SODIUM CHLORIDE 0.9 % IV SOLN
INTRAVENOUS | Status: DC
  Administered 2017-05-05: 125 mL/h via INTRAVENOUS
  Administered 2017-05-06: 1000 mL via INTRAVENOUS
  Administered 2017-05-07 – 2017-05-08 (×2): via INTRAVENOUS

## 2017-05-05 NOTE — ED Triage Notes (Signed)
Pt family states pt has had a sinus infection for the past week and has been taking Levofloxin 500mg  once a day  States pt has not been eating well and has not slept for the past 2 days  Pt today has been disoriented today with dizzy spells  Pt went to lay down tonight and rolled out of bed onto the floor  Denies injury  Pt has some noted confusion at this time

## 2017-05-05 NOTE — ED Notes (Signed)
Patient in xray 

## 2017-05-05 NOTE — ED Notes (Signed)
Pt had a fall today due to dizziness without aura, and had a fall this January where she was placed on oxygen at home. Pt not on oxygen at home now. Currently under treatment for sinus infection.

## 2017-05-05 NOTE — ED Provider Notes (Signed)
[facility] DEPT Provider Note   CSN: [id number] Arrival date & time: 05/05/17  1921     History   Chief Complaint Chief Complaint  Patient presents with  . Fall  . Altered Mental Status    HPI Bianca Matthews is a 83 y.o. female with history of CHF presenting with increased confusion over the past week which has been worse today. She also had an unwitnessed fall prior to arrival where she denies hitting her head on losing consciousness. Denies any anticoagulant use. Son is in the room helping with history. He reports that she had a sinus infection and has been treated with Levaquin over the past week. He noticed that over the last 2 days she hasn't been sleeping and has been increasingly confused. He also explained that she recently lost her son in July 2018 and has been under a lot of stress. As she was waiting here in the emergency department she started experiencing tremors and involuntary jerking movements. She denies any congestion at this time, facial pain or pressure, nausea, vomiting, dizziness, change in vision, headache, cough, chest pain, shortness of breath, fever or chills.  HPI  Past Medical History:  Diagnosis Date  . Allergy    sees Dr. [name]  . GERD (gastroesophageal reflux disease)   . Hyperlipidemia   . Hypertension   . Varicose veins     Patient Active Problem List   Diagnosis Date Noted  . AKI (acute kidney injury) ([place]) 05/05/2017  . Hyponatremia 05/05/2017  . Sinusitis 05/05/2017  . Chronic respiratory failure with hypoxia and hypercapnia ([place]) 11/04/2016  . Morbid obesity due to excess calories ([place]) 11/04/2016  . Hypoxia 10/31/2016  . Syncope 10/27/2016  . Skull fracture with concussion ([place]) 10/08/2016  . Diastolic CHF, chronic ([place]) 10/08/2016  . Hypokalemia 10/08/2016  . Hyperlipidemia 12/02/2015  . Hyperglycemia 02/24/2015  . Other and unspecified hyperlipidemia 06/16/2014  . Low back pain 06/16/2014  . Anxiety state 01/30/2014  . Rash  03/14/2011  . ANKLE PAIN 08/31/2010  . Arthropathy, multiple sites 06/27/2010  . Allergic rhinitis 05/06/2008  . DEPENDENT EDEMA 01/29/2008  . IDIOPATHIC URTICARIA 09/16/2007  . Essential hypertension 06/11/2007  . GERD 06/11/2007    Past Surgical History:  Procedure Laterality Date  . APPENDECTOMY    . TONSILLECTOMY      OB History    Gravida Para Term Preterm AB Living   3 3           SAB TAB Ectopic Multiple Live Births                   Home Medications    Prior to Admission medications   Medication Sig Start Date End Date Taking? Authorizing Provider  aspirin 325 MG tablet Take 325 mg by mouth at bedtime.    Yes [provider]  atenolol (TENORMIN) 100 MG tablet Take 100 mg by mouth 2 (two) times daily.    Yes [provider]  b complex vitamins tablet Take 1 tablet by mouth daily.   Yes [provider]  furosemide (LASIX) 40 MG tablet TAKE 1 TABLET THREE TIMES DAILY 04/06/17  Yes Laurey Morale, MD  levocetirizine (XYZAL) 2.5 MG/5ML solution Take 2.5 mg by mouth daily.   Yes [provider]  losartan (COZAAR) 100 MG tablet Take 1 tablet (100 mg total) by mouth daily. 10/31/16  Yes Laurey Morale, MD  Magnesium 250 MG TABS Take 1 tablet by mouth daily.   Yes [provider]  Multiple Vitamin (MULTIVITAMIN WITH MINERALS) TABS tablet Take 1 tablet by mouth daily. Women's One a Day   Yes [provider]  omeprazole (PRILOSEC) 40 MG capsule TAKE 1 CAPSULE EVERY DAY 12/21/15  Yes Laurey Morale, MD  Probiotic Product (PROBIOTIC FORMULA PO) Take 1 tablet by mouth daily.    Yes [provider]  rosuvastatin (CRESTOR) 10 MG tablet TAKE 1 TABLET AT BEDTIME 04/06/17  Yes Laurey Morale, MD  spironolactone (ALDACTONE) 25 MG tablet Take 1 tablet (25 mg total) by mouth 2 (two) times daily. 12/06/16  Yes Laurey Morale, MD  Azelastine-Fluticasone [facility]) 137-50 MCG/ACT SUSP Place 1 spray into the nose daily.    [provider]    Family History Family History  Problem Relation Age of Onset  . Hypertension Unknown   . Heart disease Unknown     Social History Social History  Substance Use Topics  . Smoking status: Former Smoker    Packs/day: 0.50    Years: 10.00  . Smokeless tobacco: Never Used  . Alcohol use No     Allergies   Patient has no known allergies.   Review of Systems Review of Systems  Constitutional: Negative for chills, diaphoresis and fever.  HENT: Negative for congestion, ear pain, sinus pain, sinus pressure, sore throat and trouble swallowing.   Eyes: Negative for photophobia, pain, redness and visual disturbance.  Respiratory: Negative for cough, choking, chest tightness, shortness of breath, wheezing and stridor.   Cardiovascular: Positive for leg swelling. Negative for chest pain and palpitations.       Patient reports bilateral leg swelling at baseline due to varicose veins  Gastrointestinal: Negative for abdominal distention, abdominal pain, diarrhea, nausea and vomiting.  Genitourinary: Negative for difficulty urinating, dysuria, flank pain and hematuria.  Musculoskeletal: Negative for arthralgias, back pain, joint swelling, myalgias, neck pain and neck stiffness.  Skin: Negative for color change, pallor and rash.  Neurological: Negative for dizziness, tremors, seizures, syncope, facial asymmetry, speech difficulty, weakness, light-headedness, numbness and headaches.  Psychiatric/Behavioral: Positive for confusion and sleep disturbance.     Physical Exam Updated Vital Signs BP (!) 145/56 (BP Location: Left Arm)   Pulse 73   Temp 98 F (36.7 C) (Oral)   Resp 18   Ht 5\' 4"  (1.626 m)   Wt 75 kg (165 lb 5.5 oz)   SpO2 100%   BMI 28.38 kg/m   Physical Exam  Constitutional: She is oriented to person, place, and time. She appears well-developed and well-nourished. No distress.  Afebrile, nontoxic-appearing, lying comfortably in bed in no acute distress.    HENT:  Head: Normocephalic and atraumatic.  Right Ear: External ear normal.  Left Ear: External ear normal.  Nose: Nose normal.  Mouth/Throat: No oropharyngeal exudate.  Mucous membranes appear dry. No tenderness palpation of frontal maxillary sinuses.  Eyes: Pupils are equal, round, and reactive to light. Conjunctivae and EOM are normal. Right eye exhibits no discharge. Left eye exhibits no discharge.  No nystagmus  Neck: Normal range of motion. Neck supple.  Cardiovascular: Normal rate, regular rhythm, normal heart sounds and intact distal pulses.   No murmur heard. No lower extremity pitting edema. Patient does not appear fluid overloaded  Pulmonary/Chest: Effort normal and breath sounds normal. No respiratory distress. She has no wheezes. She has no rales. She exhibits no tenderness.  Abdominal: Soft. She exhibits no distension and no mass. There is no tenderness. There is no rebound and no guarding.  Musculoskeletal:  Normal range of motion. She exhibits no edema, tenderness or deformity.  Neurological: She is alert and oriented to person, place, and time. No cranial nerve deficit or sensory deficit.  Neurologic Exam:  - Mental status: Patient is alert and cooperative. Fluent speech and words are clear. Coherent thought processes and insight is good. Patient is oriented x 3 to person, time and event.  - Cranial nerves:  CN III, IV, VI: pupils equally round, reactive to light both direct and conscensual. Full extra-ocular movement. CN V: motor temporalis and masseter strength intact. CN VII : muscles of facial expression intact. CN X :  midline uvula. XI strength of sternocleidomastoid and trapezius muscles 5/5, XII: tongue is midline when protruded. - Motor:  Muscle tone and bulk normal throughout. Muscle strength is 5/5 in bilateral shoulder abduction, elbow flexion and extension, grip, hip extension, flexion, leg flexion and extension, ankle dorsiflexion and plantar flexion.  - Sensory:  Proprioception, light tough sensation intact in all extremities. - Cerebellar: rapid alternating movements and point to point movement slow but intact in upper and lower extremities.   Skin: Skin is warm and dry. No rash noted. She is not diaphoretic. No erythema. No pallor.  Psychiatric: She has a normal mood and affect.  Nursing note and vitals reviewed.    ED Treatments / Results  Labs (all labs ordered are listed, but only abnormal results are displayed) Labs Reviewed  BASIC METABOLIC PANEL - Abnormal; Notable for the following:       Result Value   Sodium 123 (*)    Potassium 5.3 (*)    Chloride 87 (*)    CO2 19 (*)    Glucose, Bld 102 (*)    BUN 94 (*)    Creatinine, Ser 3.46 (*)    GFR calc non Af Amer 11 (*)    GFR calc Af Amer 13 (*)    Anion gap 17 (*)    All other components within normal limits  CBC - Abnormal; Notable for the following:    RBC 3.82 (*)    HCT 33.0 (*)    MCHC 36.4 (*)    All other components within normal limits  BRAIN NATRIURETIC PEPTIDE - Abnormal; Notable for the following:    B Natriuretic Peptide 123.9 (*)    All other components within normal limits  HEPATIC FUNCTION PANEL - Abnormal; Notable for the following:    Total Protein 6.3 (*)    Albumin 3.3 (*)    AST 72 (*)    ALT 13 (*)    Total Bilirubin 1.8 (*)    Bilirubin, Direct 0.8 (*)    Indirect Bilirubin 1.0 (*)    All other components within normal limits  MAGNESIUM  PROTIME-INR  SODIUM, URINE, RANDOM  CREATININE, URINE, RANDOM  UREA NITROGEN, URINE  PROTEIN / CREATININE RATIO, URINE  OSMOLALITY, URINE  BASIC METABOLIC PANEL  OSMOLALITY  BASIC METABOLIC PANEL  BASIC METABOLIC PANEL  CBC  TSH  CBG MONITORING, ED  CBG MONITORING, ED  I-STAT TROPONIN, ED  I-STAT CG4 LACTIC ACID, ED  I-STAT CG4 LACTIC ACID, ED    EKG  EKG Interpretation  Date/Time:  Saturday May 05 2017 20:46:41 EDT Ventricular Rate:  52 PR Interval:    QRS Duration: 100 QT  Interval:  425 QTC Calculation: 396 R Axis:   49 Text Interpretation:  Sinus rhythm Low voltage, precordial leads Borderline ST elevation, inferior leads Baseline wander in lead(s) II When compared to prior, no significant changes.  No STEMI Confirmed by Antony Blackbird [phone]) on 05/05/2017 10:05:01 PM       Radiology Dg Chest 2 View  Result Date: 05/05/2017 CLINICAL DATA:  83 year old female with altered mental status. EXAM: CHEST  2 VIEW COMPARISON:  Chest radiograph dated 11/03/2016 FINDINGS: There is mild eventration of the right hemidiaphragm. There bibasilar atelectatic changes. No focal consolidation, pleural effusion, or pneumothorax. The cardiac silhouette is within normal limits. There is atherosclerotic calcification of the aorta. No acute osseous pathology. IMPRESSION: No acute cardiopulmonary process.  No interval change. Electronically Signed   By: Anner Crete M.D.   On: 05/05/2017 22:50   Ct Head Wo Contrast  Result Date: 05/05/2017 CLINICAL DATA:  Acute onset of altered level of consciousness. Initial encounter. EXAM: CT HEAD WITHOUT CONTRAST TECHNIQUE: Contiguous axial images were obtained from the base of the skull through the vertex without intravenous contrast. COMPARISON:  CT of the head performed 10/08/2016 FINDINGS: Brain: No evidence of acute infarction, hemorrhage, hydrocephalus, extra-axial collection or mass lesion/mass effect. Prominence of the ventricles and sulci reflects mild to moderate cortical volume loss. Cerebellar atrophy is noted. Scattered periventricular and subcortical white matter change likely reflects small vessel ischemic microangiopathy. The brainstem and fourth ventricle are within normal limits. The basal ganglia are unremarkable in appearance. The cerebral hemispheres demonstrate grossly normal gray-white differentiation. No mass effect or midline shift is seen. Vascular: No hyperdense vessel or unexpected calcification. Skull: There is no evidence of  fracture; visualized osseous structures are unremarkable in appearance. Sinuses/Orbits: The visualized portions of the orbits are within normal limits. The paranasal sinuses and mastoid air cells are well-aerated. Other: No significant soft tissue abnormalities are seen. IMPRESSION: 1. No acute intracranial pathology seen on CT. 2. Mild to moderate cortical volume loss and scattered small vessel ischemic microangiopathy. Electronically Signed   By: Garald Balding M.D.   On: 05/05/2017 23:19    Procedures Procedures (including critical care time)  Medications Ordered in ED Medications  rosuvastatin (CRESTOR) tablet 10 mg (10 mg Oral Given 05/06/17 0104)  atenolol (TENORMIN) tablet 100 mg (100 mg Oral Given 05/06/17 0104)  acidophilus (RISAQUAD) capsule 1 capsule (not administered)  enoxaparin (LOVENOX) injection 30 mg (not administered)  0.9 %  sodium chloride infusion (125 mL/hr Intravenous New Bag/Given 05/05/17 2331)  acetaminophen (TYLENOL) tablet 650 mg (not administered)    Or  acetaminophen (TYLENOL) suppository 650 mg (not administered)  polyethylene glycol (MIRALAX / GLYCOLAX) packet 17 g (not administered)  ondansetron (ZOFRAN) tablet 4 mg (not administered)    Or  ondansetron (ZOFRAN) injection 4 mg (not administered)  loratadine (CLARITIN) tablet 10 mg (not administered)  sodium chloride 0.9 % bolus 500 mL (500 mLs Intravenous New Bag/Given 05/05/17 2331)     Initial Impression / Assessment and Plan / ED Course  I have reviewed the triage vital signs and the nursing notes.  Pertinent labs & imaging results that were available during my care of the patient were reviewed by me and considered in my medical decision making (see chart for details).    Patient presenting with increased confusion, hyponatremia, hyperkalemia, and AKI. Onset of involuntary jerking movements and tremors while in the emergency department. Ordered magnesium  Some difficulties following commands may be due to  hearing difficulties but otherwise neuro exam normal. Son reports that she can follow commands and is typically more coherent at baseline. No chest pain, shortness of breath, or signs of fluid overload.   Will call for admission for acute renal failure with  electrolyte disturbances and altered mental status.  Patient was discussed with Dr. Sherry Ruffing who has seen patient and agrees with assessment and plan.  Spoke to admitting physician Dr. Carmina Miller who will be admitting patient. Final Clinical Impressions(s) / ED Diagnoses   Final diagnoses:  AKI (acute kidney injury) ([place])  Disorientation  Fall, initial encounter  Hyponatremia    New Prescriptions Current Discharge Medication List       Dossie Der 05/06/17 0216    Emeline General, PA-C 05/06/17 0304    Tegeler, Gwenyth Allegra, MD 05/07/17 1034

## 2017-05-06 ENCOUNTER — Inpatient Hospital Stay (HOSPITAL_COMMUNITY): Payer: Medicare PPO

## 2017-05-06 DIAGNOSIS — G9341 Metabolic encephalopathy: Secondary | ICD-10-CM

## 2017-05-06 LAB — CBC
HCT: 35 % — ABNORMAL LOW (ref 36.0–46.0)
Hemoglobin: 12.3 g/dL (ref 12.0–15.0)
MCH: 30.5 pg (ref 26.0–34.0)
MCHC: 35.1 g/dL (ref 30.0–36.0)
MCV: 86.8 fL (ref 78.0–100.0)
Platelets: 317 10*3/uL (ref 150–400)
RBC: 4.03 MIL/uL (ref 3.87–5.11)
RDW: 13.4 % (ref 11.5–15.5)
WBC: 8.2 10*3/uL (ref 4.0–10.5)

## 2017-05-06 LAB — TSH: TSH: 3.962 u[IU]/mL (ref 0.350–4.500)

## 2017-05-06 LAB — URINALYSIS, ROUTINE W REFLEX MICROSCOPIC
Bacteria, UA: NONE SEEN
Bilirubin Urine: NEGATIVE
Glucose, UA: NEGATIVE mg/dL
Hgb urine dipstick: NEGATIVE
Ketones, ur: 5 mg/dL — AB
Leukocytes, UA: NEGATIVE
Nitrite: NEGATIVE
Protein, ur: NEGATIVE mg/dL
Specific Gravity, Urine: 1.008 (ref 1.005–1.030)
Squamous Epithelial / LPF: NONE SEEN
pH: 5 (ref 5.0–8.0)

## 2017-05-06 LAB — BASIC METABOLIC PANEL
Anion gap: 17 — ABNORMAL HIGH (ref 5–15)
Anion gap: 11 (ref 5–15)
Anion gap: 10 (ref 5–15)
BUN: 82 mg/dL — ABNORMAL HIGH (ref 6–20)
BUN: 70 mg/dL — ABNORMAL HIGH (ref 6–20)
BUN: 54 mg/dL — ABNORMAL HIGH (ref 6–20)
CO2: 16 mmol/L — ABNORMAL LOW (ref 22–32)
CO2: 22 mmol/L (ref 22–32)
CO2: 22 mmol/L (ref 22–32)
Calcium: 9.3 mg/dL (ref 8.9–10.3)
Calcium: 9.3 mg/dL (ref 8.9–10.3)
Calcium: 9.1 mg/dL (ref 8.9–10.3)
Chloride: 94 mmol/L — ABNORMAL LOW (ref 101–111)
Chloride: 100 mmol/L — ABNORMAL LOW (ref 101–111)
Chloride: 104 mmol/L (ref 101–111)
Creatinine, Ser: 2.64 mg/dL — ABNORMAL HIGH (ref 0.44–1.00)
Creatinine, Ser: 1.97 mg/dL — ABNORMAL HIGH (ref 0.44–1.00)
Creatinine, Ser: 1.64 mg/dL — ABNORMAL HIGH (ref 0.44–1.00)
GFR calc Af Amer: 18 mL/min — ABNORMAL LOW (ref >60)
GFR calc Af Amer: 25 mL/min — ABNORMAL LOW (ref >60)
GFR calc Af Amer: 31 mL/min — ABNORMAL LOW (ref >60)
GFR calc non Af Amer: 15 mL/min — ABNORMAL LOW (ref >60)
GFR calc non Af Amer: 22 mL/min — ABNORMAL LOW (ref >60)
GFR calc non Af Amer: 27 mL/min — ABNORMAL LOW (ref >60)
Glucose, Bld: 110 mg/dL — ABNORMAL HIGH (ref 65–99)
Glucose, Bld: 119 mg/dL — ABNORMAL HIGH (ref 65–99)
Glucose, Bld: 125 mg/dL — ABNORMAL HIGH (ref 65–99)
Potassium: 5.1 mmol/L (ref 3.5–5.1)
Potassium: 4.5 mmol/L (ref 3.5–5.1)
Potassium: 4.3 mmol/L (ref 3.5–5.1)
Sodium: 127 mmol/L — ABNORMAL LOW (ref 135–145)
Sodium: 133 mmol/L — ABNORMAL LOW (ref 135–145)
Sodium: 136 mmol/L (ref 135–145)

## 2017-05-06 LAB — PROTIME-INR
INR: 1.1
Prothrombin Time: 14.3 seconds (ref 11.4–15.2)

## 2017-05-06 LAB — HEPATIC FUNCTION PANEL
ALT: 13 U/L — ABNORMAL LOW (ref 14–54)
AST: 72 U/L — ABNORMAL HIGH (ref 15–41)
Albumin: 3.3 g/dL — ABNORMAL LOW (ref 3.5–5.0)
Alkaline Phosphatase: 51 U/L (ref 38–126)
Bilirubin, Direct: 0.8 mg/dL — ABNORMAL HIGH (ref 0.1–0.5)
Indirect Bilirubin: 1 mg/dL — ABNORMAL HIGH (ref 0.3–0.9)
Total Bilirubin: 1.8 mg/dL — ABNORMAL HIGH (ref 0.3–1.2)
Total Protein: 6.3 g/dL — ABNORMAL LOW (ref 6.5–8.1)

## 2017-05-06 LAB — OSMOLALITY, URINE: Osmolality, Ur: 283 mOsm/kg — ABNORMAL LOW (ref 300–900)

## 2017-05-06 LAB — SODIUM, URINE, RANDOM: Sodium, Ur: 67 mmol/L

## 2017-05-06 LAB — AMMONIA: Ammonia: 13 umol/L (ref 9–35)

## 2017-05-06 LAB — MAGNESIUM: Magnesium: 2.1 mg/dL (ref 1.7–2.4)

## 2017-05-06 LAB — BRAIN NATRIURETIC PEPTIDE: B Natriuretic Peptide: 123.9 pg/mL — ABNORMAL HIGH (ref 0.0–100.0)

## 2017-05-06 LAB — OSMOLALITY: Osmolality: 288 mOsm/kg (ref 275–295)

## 2017-05-06 LAB — CREATININE, URINE, RANDOM: Creatinine, Urine: 33.27 mg/dL

## 2017-05-06 MED ORDER — LEVOCETIRIZINE DIHYDROCHLORIDE 2.5 MG/5ML PO SOLN: 2.5000 mg | Freq: Every day | ORAL | Status: DC

## 2017-05-06 MED ORDER — ONDANSETRON HCL 4 MG/2ML IJ SOLN: 4.0000 mg | Freq: Four times a day (QID) | INTRAMUSCULAR | Status: DC | PRN

## 2017-05-06 MED ORDER — QUETIAPINE FUMARATE 25 MG PO TABS
25.0000 mg | ORAL_TABLET | Freq: Every day | ORAL | Status: DC
  Administered 2017-05-06: 25 mg via ORAL
  Filled 2017-05-06: qty 1

## 2017-05-06 MED ORDER — ACETAMINOPHEN 650 MG RE SUPP: 650.0000 mg | Freq: Four times a day (QID) | RECTAL | Status: DC | PRN

## 2017-05-06 MED ORDER — ONDANSETRON HCL 4 MG PO TABS: 4.0000 mg | ORAL_TABLET | Freq: Four times a day (QID) | ORAL | Status: DC | PRN

## 2017-05-06 MED ORDER — POLYETHYLENE GLYCOL 3350 17 G PO PACK: 17.0000 g | PACK | Freq: Every day | ORAL | Status: DC | PRN

## 2017-05-06 MED ORDER — ENOXAPARIN SODIUM 30 MG/0.3ML ~~LOC~~ SOLN
30.0000 mg | SUBCUTANEOUS | Status: DC
  Administered 2017-05-06 – 2017-05-07 (×2): 30 mg via SUBCUTANEOUS
  Filled 2017-05-06 (×5): qty 0.3

## 2017-05-06 MED ORDER — LORATADINE 10 MG PO TABS
10.0000 mg | ORAL_TABLET | Freq: Every day | ORAL | Status: DC
  Administered 2017-05-06 – 2017-05-07 (×2): 10 mg via ORAL
  Filled 2017-05-06 (×3): qty 1

## 2017-05-06 MED ORDER — ACETAMINOPHEN 325 MG PO TABS: 650.0000 mg | ORAL_TABLET | Freq: Four times a day (QID) | ORAL | Status: DC | PRN

## 2017-05-06 MED ORDER — HALOPERIDOL LACTATE 5 MG/ML IJ SOLN
2.0000 mg | Freq: Four times a day (QID) | INTRAMUSCULAR | Status: DC | PRN
  Administered 2017-05-06 – 2017-05-07 (×3): 2 mg via INTRAVENOUS
  Filled 2017-05-06 (×3): qty 1

## 2017-05-06 MED ORDER — LIP MEDEX EX OINT
TOPICAL_OINTMENT | CUTANEOUS | Status: AC
  Administered 2017-05-06: 1
  Filled 2017-05-06: qty 7

## 2017-05-06 MED ORDER — ROSUVASTATIN CALCIUM 10 MG PO TABS
10.0000 mg | ORAL_TABLET | Freq: Every day | ORAL | Status: DC
  Administered 2017-05-06: 10 mg via ORAL
  Filled 2017-05-06: qty 1

## 2017-05-06 MED ORDER — ATENOLOL 50 MG PO TABS
100.0000 mg | ORAL_TABLET | Freq: Two times a day (BID) | ORAL | Status: DC
  Administered 2017-05-06 – 2017-05-08 (×5): 100 mg via ORAL
  Filled 2017-05-06 (×6): qty 2

## 2017-05-06 MED ORDER — RISAQUAD PO CAPS
1.0000 | ORAL_CAPSULE | Freq: Every day | ORAL | Status: DC
  Administered 2017-05-06 – 2017-05-08 (×3): 1 via ORAL
  Filled 2017-05-06 (×3): qty 1

## 2017-05-06 NOTE — Progress Notes (Signed)
PROGRESS NOTE    Bianca Matthews  IOE:[id number] DOB: 08/14/1928 DOA: 05/05/2017 PCP: Laurey Morale, MD     Brief Narrative:  Bianca Matthews is a 83 yo female with past medical history significant for HFpEF and HTN who presents with altered mental status, weakness, malaise, and fell out of bed. History is gathered from previous note, son at bedside. Normally, she lives by herself, very functional at home, no history of dementia. Her oldest son died in early 05-03-2023 due to cancer. She has been dealing with grief. She also has had some back and neck pain, was prescribed Voltaren by her primary care physician. Over the past 2 days, she hasn't slept at all, has been pacing around her house. She took a brief nap, then rolled out of bed and fell and could not get up. Evaluation in the ED revealed AKI.   Assessment & Plan:   Principal Problem:   AKI (acute kidney injury) ([place]) Active Problems:   Essential hypertension   Diastolic CHF, chronic ([facility])   Morbid obesity due to excess calories ([facility])   Hyponatremia   Sinusitis   Acute metabolic encephalopathy -Combination of insomnia, grief, AKI, hyponatremia  -CT head: No acute intracranial pathology  -Supportive care -May require haldol IV if continues to be agitated, will also trial seroquel qhs   AKI -In setting of NSAID, lasix, spironolactone, cozaar use, likely ATN vs obstruction. FeNa 4.22%  -Holding lasix, cozaar, spironolactone, stop NSAID -Cr 3.46 --> 2.64 with IVF -Check renal US r/o obstruction -Check UA  -Trend BMP  -IVF   Hypovolemic hyponatremia -IVF, improving, trend BMP  Chronic diastolic heart failure -Stable -Hold lasix, spironolactone   HTN -Continue atenolol  -Hold lasix, spironolactone, cozaar   HLD -Hold crestor   Acute urinary retention -Foley placed  Grief -Appropriate grief given her son passing in 05-03-23     DVT prophylaxis: lovenox Code Status: DNR Family Communication: Son at bedside Disposition  Plan: pending improvement   Consultants:   None  Procedures:   None   Antimicrobials:  Anti-infectives    None       Subjective: Patient was evaluated on 2 occasions this morning. In the morning, patient was seen sitting at the side of bed, eating breakfast. She became very tearful speaking of her son who had recently passed away. She was able to tell me that she has not slept in 2 days, has been pacing around, and finally took a short nap and found herself rolled out of bed onto the floor. She had no other complaints.   She was again evaluated with son at bedside. She is in bed, more confused than on my previous exam. She continued to try to climb out of bed, did not understand foley in place, and continued to speak about going to the bathroom.   Objective: Vitals:   05/05/17 2200 05/05/17 2337 05/06/17 0033 05/06/17 0459  BP: (!) 131/52 94/62 (!) 145/56 136/62  Pulse: 60 72 73 86  Resp: 17 15 18 18   Temp:   98 F (36.7 C) 99 F (37.2 C)  TempSrc:   Oral Oral  SpO2: (!) 79% 95% 100% 99%  Weight:   75 kg (165 lb 5.5 oz)   Height:   5\' 4"  (1.626 m)     Intake/Output Summary (Last 24 hours) at 05/06/17 1128 Last data filed at 05/06/17 0955  Gross per 24 hour  Intake              870  ml  Output             2000 ml  Net            -1130 ml   Filed Weights   05/05/17 1950 05/06/17 0033  Weight: 76.7 kg (169 lb 3.2 oz) 75 kg (165 lb 5.5 oz)    Examination:  General exam: Appears calm and comfortable  Respiratory system: Clear to auscultation. Respiratory effort normal. Cardiovascular system: S1 & S2 heard, RRR. No JVD, murmurs, rubs, gallops or clicks. No pedal edema. Gastrointestinal system: Abdomen is nondistended, soft and nontender. No organomegaly or masses felt. Normal bowel sounds heard. Central nervous system: Alert, speech fluent, nonfocal exam  Extremities: Symmetric  Skin: No rashes, lesions or ulcers Psychiatry: Confused this morning   Data Reviewed:  I have personally reviewed following labs and imaging studies  CBC:  Recent Labs Lab 05/05/17 2041 05/06/17 0347  WBC 8.8 8.2  HGB 12.0 12.3  HCT 33.0* 35.0*  MCV 86.4 86.8  PLT 313 161   Basic Metabolic Panel:  Recent Labs Lab 05/05/17 2041 05/05/17 2219 05/06/17 0347  NA 123*  --  127*  K 5.3*  --  5.1  CL 87*  --  94*  CO2 19*  --  16*  GLUCOSE 102*  --  110*  BUN 94*  --  82*  CREATININE 3.46*  --  2.64*  CALCIUM 9.6  --  9.3  MG  --  2.1  --    GFR: Estimated Creatinine Clearance: 14.9 mL/min (A) (by C-G formula based on SCr of 2.64 mg/dL (H)). Liver Function Tests:  Recent Labs Lab 05/05/17 2219  AST 72*  ALT 13*  ALKPHOS 51  BILITOT 1.8*  PROT 6.3*  ALBUMIN 3.3*   No results for input(s): LIPASE, AMYLASE in the last 168 hours. No results for input(s): AMMONIA in the last 168 hours. Coagulation Profile:  Recent Labs Lab 05/05/17 2219  INR 1.10   Cardiac Enzymes: No results for input(s): CKTOTAL, CKMB, CKMBINDEX, TROPONINI in the last 168 hours. BNP (last 3 results)  Recent Labs  11/03/16 1302  PROBNP 239.0*   HbA1C: No results for input(s): HGBA1C in the last 72 hours. CBG:  Recent Labs Lab 05/05/17 1953  GLUCAP 98   Lipid Profile: No results for input(s): CHOL, HDL, LDLCALC, TRIG, CHOLHDL, LDLDIRECT in the last 72 hours. Thyroid Function Tests:  Recent Labs  05/06/17 0347  TSH 3.962   Anemia Panel: No results for input(s): VITAMINB12, FOLATE, FERRITIN, TIBC, IRON, RETICCTPCT in the last 72 hours. Sepsis Labs:  Recent Labs Lab 05/05/17 2349  LATICACIDVEN 0.61    No results found for this or any previous visit (from the past 240 hour(s)).     Radiology Studies: Dg Chest 2 View  Result Date: 05/05/2017 CLINICAL DATA:  83 year old female with altered mental status. EXAM: CHEST  2 VIEW COMPARISON:  Chest radiograph dated 11/03/2016 FINDINGS: There is mild eventration of the right hemidiaphragm. There bibasilar  atelectatic changes. No focal consolidation, pleural effusion, or pneumothorax. The cardiac silhouette is within normal limits. There is atherosclerotic calcification of the aorta. No acute osseous pathology. IMPRESSION: No acute cardiopulmonary process.  No interval change. Electronically Signed   By: Anner Crete M.D.   On: 05/05/2017 22:50   Ct Head Wo Contrast  Result Date: 05/05/2017 CLINICAL DATA:  Acute onset of altered level of consciousness. Initial encounter. EXAM: CT HEAD WITHOUT CONTRAST TECHNIQUE: Contiguous axial images were obtained from the base of  the skull through the vertex without intravenous contrast. COMPARISON:  CT of the head performed 10/08/2016 FINDINGS: Brain: No evidence of acute infarction, hemorrhage, hydrocephalus, extra-axial collection or mass lesion/mass effect. Prominence of the ventricles and sulci reflects mild to moderate cortical volume loss. Cerebellar atrophy is noted. Scattered periventricular and subcortical white matter change likely reflects small vessel ischemic microangiopathy. The brainstem and fourth ventricle are within normal limits. The basal ganglia are unremarkable in appearance. The cerebral hemispheres demonstrate grossly normal gray-white differentiation. No mass effect or midline shift is seen. Vascular: No hyperdense vessel or unexpected calcification. Skull: There is no evidence of fracture; visualized osseous structures are unremarkable in appearance. Sinuses/Orbits: The visualized portions of the orbits are within normal limits. The paranasal sinuses and mastoid air cells are well-aerated. Other: No significant soft tissue abnormalities are seen. IMPRESSION: 1. No acute intracranial pathology seen on CT. 2. Mild to moderate cortical volume loss and scattered small vessel ischemic microangiopathy. Electronically Signed   By: Garald Balding M.D.   On: 05/05/2017 23:19      Scheduled Meds: . acidophilus  1 capsule Oral Daily  . atenolol  100 mg  Oral BID  . enoxaparin (LOVENOX) injection  30 mg Subcutaneous Q24H  . loratadine  10 mg Oral Daily  . QUEtiapine  25 mg Oral QHS   Continuous Infusions: . sodium chloride 125 mL/hr (05/05/17 2331)     LOS: 1 day    Time spent: 76 minutes   Dessa Phi, DO [facility] [email or web address] Password [email or web address] 05/06/2017, 11:28 AM

## 2017-05-06 NOTE — Progress Notes (Signed)
Pt becoming increasingly confused and teary eyed about son's recent death. Her other son came in this AM and states his mother is much more confused . She normally lives  alone and he checks on her. Dr.Choi contacted and made aware of situation. She came to speak with son. Appetite poor. Family at bedside

## 2017-05-06 NOTE — Progress Notes (Signed)
PT Cancellation Note  Patient Details Name: Bianca Matthews MRN: [id number] DOB: 1928/04/27   Cancelled Treatment:    Reason Eval/Treat Not Completed: Order received. Chart reviewed. Spoke with RN who recommended PT be held today to allow pt to rest.  Pt also recently medicated with haldol. Will check back another day.    Weston Anna, MPT Pager: [phone]

## 2017-05-06 NOTE — H&P (Signed)
History and Physical  Patient Name: Bianca Matthews     TKZ:[id number]    DOB: 12/10/1927    DOA: 05/05/2017 PCP: Laurey Morale, MD  Patient coming from: Home  Chief Complaint: [place] out of bed      HPI: Bianca Matthews is a 83 y.o. female with a past medical history significant for HFpEF and HTN who presents with altered mental status, weakness and malaise and fell out of bed.  Caveat that the patient is somewhat altered, so all history is corrected from son at the bedside. Evidently she lives by herself, still drives, rarely uses a cane or walker to get around, is very functional, doesn't have any dementia.  Recently her oldest son died, and she has been somewhat distraught since then. 2 weeks ago, she saw her PCP and was complaining of back pain and neck pain and shoulder pain, and so was prescribed Voltaren by mouth, which she's been taking since then. Subsequently she developed symptoms of a sinus infection and was prescribed Levaquin which she has been taking as well since Monday.  Over the last two days, she "hasn't slept at all", then today, she was not doing well, so her son spent the day with her.  He noticed she had generalized weakness, hardly any appetite, seemed very tired and somewhat confused.  Tonight, after he left, she fell out of bed and couldn't get up, and called EMS, and so was brought to the ER.  ED course: -Afebrile, heart rate 55, respirations pulse ox normal, blood pressure 105/88 -Na 123 (baseline 133), K 5.3, Cr 3.46 (baseline 1.0), WBC 8.8K, Hgb 12 -Troponin negative -CT head and chest x-ray normal -ECG sinus bradycardia -TRH were asked to evaluate for admission for confusion and AKI   She has no previous history of renal disease.  She did have a new onset of diastolic CHF in January, was placed on fairly large doses of Lasix, but she has been on them since March and had only mild renal insufficiency (that resolved with lowering dose from TID to BID, which she  has been on since then).     ROS: Review of Systems  Constitutional: Positive for malaise/fatigue. Negative for fever.  Musculoskeletal: Positive for back pain and falls.  Neurological: Positive for weakness. Negative for seizures and loss of consciousness.  All other systems reviewed and are negative.         Past Medical History:  Diagnosis Date  . Allergy    sees Dr. [name]  . GERD (gastroesophageal reflux disease)   . Hyperlipidemia   . Hypertension   . Varicose veins     Past Surgical History:  Procedure Laterality Date  . APPENDECTOMY    . TONSILLECTOMY      Social History: Patient lives alone.  The patient walks unassisted .  Nonsmoker.  No dementia.  Still drives.  No Known Allergies  Family history: family history includes Heart disease in her unknown relative; Hypertension in her unknown relative.  Prior to Admission medications   Medication Sig Start Date End Date Taking? Authorizing Provider  aspirin 325 MG tablet Take 325 mg by mouth at bedtime.    Yes [provider]  atenolol (TENORMIN) 100 MG tablet Take 100 mg by mouth 2 (two) times daily.    Yes [provider]  b complex vitamins tablet Take 1 tablet by mouth daily.   Yes [provider]  furosemide (LASIX) 40 MG tablet TAKE 1 TABLET THREE TIMES DAILY 04/06/17  Yes Laurey Morale, MD  levocetirizine Harlow Ohms) 2.5 MG/5ML solution Take 2.5 mg by mouth daily.   Yes [provider]  losartan (COZAAR) 100 MG tablet Take 1 tablet (100 mg total) by mouth daily. 10/31/16  Yes Laurey Morale, MD  Magnesium 250 MG TABS Take 1 tablet by mouth daily.   Yes [provider]  Multiple Vitamin (MULTIVITAMIN WITH MINERALS) TABS tablet Take 1 tablet by mouth daily. Women's One a Day   Yes [provider]  omeprazole (PRILOSEC) 40 MG capsule TAKE 1 CAPSULE EVERY DAY 12/21/15  Yes Laurey Morale, MD  Probiotic Product (PROBIOTIC FORMULA PO) Take 1 tablet by mouth daily.     Yes [provider]  rosuvastatin (CRESTOR) 10 MG tablet TAKE 1 TABLET AT BEDTIME 04/06/17  Yes Laurey Morale, MD  spironolactone (ALDACTONE) 25 MG tablet Take 1 tablet (25 mg total) by mouth 2 (two) times daily. 12/06/16  Yes Laurey Morale, MD  Azelastine-Fluticasone [facility]) 137-50 MCG/ACT SUSP Place 1 spray into the nose daily.    [provider]       Physical Exam: BP (!) 145/56 (BP Location: Left Arm)   Pulse 73   Temp 98 F (36.7 C) (Oral)   Resp 18   Ht 5\' 4"  (1.626 m)   Wt 75 kg (165 lb 5.5 oz)   SpO2 100%   BMI 28.38 kg/m  General appearance: Well-developed, overweightadult female, awake, sometimes in mild distress for an unclear reason (she will close her eyes and moan).   Eyes: Anicteric, conjunctiva pink, lids and lashes normal. PERRL.    ENT: No nasal deformity, discharge, epistaxis.  Hearing normal. OP dry without lesions.   Neck: No neck masses.  Trachea midline.  No thyromegaly/tenderness. Lymph: No cervical or supraclavicular lymphadenopathy. Skin: Warm and dry.  No jaundice.  No suspicious rashes or lesions. Cardiac: Slow, regular, nl S1-S2, no murmurs appreciated.  Capillary refill is brisk.  JVP not visible.  No LE edema, legs in stockings.  Radial pulses 2+ and symmetric. Respiratory: Normal respiratory rate and rhythm.  CTAB without rales or wheezes. Abdomen: Abdomen soft.  No TTP. No ascites, distension, hepatosplenomegaly.   MSK: No deformities or effusions.  No cyanosis or clubbing. Neuro: Cranial nerves 3-12 intact.  Sensation intact to light touch. Speech is fluent.  Muscle strength 5-/5 and symmetric.  Has occasional myoclonic jerks when she closes her eyes.    Psych: Sensorium intact and responding to questions, attention somewhat diminished but knows date, knows she is in the hospital.  Behavior appropriate.  Affect blunted, anxious.  Judgment and insight appear slightly impaired.     Labs on Admission:  I have personally reviewed  following labs and imaging studies: CBC:  Recent Labs Lab 05/05/17 2041  WBC 8.8  HGB 12.0  HCT 33.0*  MCV 86.4  PLT 161   Basic Metabolic Panel:  Recent Labs Lab 05/05/17 2041 05/05/17 2219  NA 123*  --   K 5.3*  --   CL 87*  --   CO2 19*  --   GLUCOSE 102*  --   BUN 94*  --   CREATININE 3.46*  --   CALCIUM 9.6  --   MG  --  2.1   GFR: Estimated Creatinine Clearance: 11.4 mL/min (A) (by C-G formula based on SCr of 3.46 mg/dL (H)).  Liver Function Tests:  Recent Labs Lab 05/05/17 2219  AST 72*  ALT 13*  ALKPHOS 51  BILITOT 1.8*  PROT 6.3*  ALBUMIN 3.3*   Coagulation Profile:  Recent Labs Lab 05/05/17 2219  INR 1.10   BNP (last 3 results)  Recent Labs  11/03/16 1302  PROBNP 239.0*   CBG:  Recent Labs Lab 05/05/17 1953  GLUCAP 20         Radiological Exams on Admission: Personally reviewed CXR shows no focal opacity or edema; CT head report reviewed: Dg Chest 2 View  Result Date: 05/05/2017 CLINICAL DATA:  83 year old female with altered mental status. EXAM: CHEST  2 VIEW COMPARISON:  Chest radiograph dated 11/03/2016 FINDINGS: There is mild eventration of the right hemidiaphragm. There bibasilar atelectatic changes. No focal consolidation, pleural effusion, or pneumothorax. The cardiac silhouette is within normal limits. There is atherosclerotic calcification of the aorta. No acute osseous pathology. IMPRESSION: No acute cardiopulmonary process.  No interval change. Electronically Signed   By: Anner Crete M.D.   On: 05/05/2017 22:50   Ct Head Wo Contrast  Result Date: 05/05/2017 CLINICAL DATA:  Acute onset of altered level of consciousness. Initial encounter. EXAM: CT HEAD WITHOUT CONTRAST TECHNIQUE: Contiguous axial images were obtained from the base of the skull through the vertex without intravenous contrast. COMPARISON:  CT of the head performed 10/08/2016 FINDINGS: Brain: No evidence of acute infarction, hemorrhage, hydrocephalus,  extra-axial collection or mass lesion/mass effect. Prominence of the ventricles and sulci reflects mild to moderate cortical volume loss. Cerebellar atrophy is noted. Scattered periventricular and subcortical white matter change likely reflects small vessel ischemic microangiopathy. The brainstem and fourth ventricle are within normal limits. The basal ganglia are unremarkable in appearance. The cerebral hemispheres demonstrate grossly normal gray-white differentiation. No mass effect or midline shift is seen. Vascular: No hyperdense vessel or unexpected calcification. Skull: There is no evidence of fracture; visualized osseous structures are unremarkable in appearance. Sinuses/Orbits: The visualized portions of the orbits are within normal limits. The paranasal sinuses and mastoid air cells are well-aerated. Other: No significant soft tissue abnormalities are seen. IMPRESSION: 1. No acute intracranial pathology seen on CT. 2. Mild to moderate cortical volume loss and scattered small vessel ischemic microangiopathy. Electronically Signed   By: Garald Balding M.D.   On: 05/05/2017 23:19    EKG: Independently reviewed. Rate 52, QTc normal, sinus bradycardia.    Echocardiogram January 2018: Report reviewed EF 60-65% Grade 2 DD PAP 33 No significant valvular disease         Assessment/Plan Principal Problem:   AKI (acute kidney injury) ([place]) Active Problems:   Essential hypertension   Diastolic CHF, chronic ([place])   Morbid obesity due to excess calories (HCC)   Hyponatremia   Sinusitis  1. Acute kidney injury:  Likely Voltaren induced, in setting of ARB, diuretics.   -Hold diuretics, ARB -Stop NSAIDs -Check FeNA, UA -Check urine protein -IV fluids overnight and trend Creatinine   2. Hyponatremia:  Acute on chronic.  Appears quite hypovolemic. -Hold diuretics -IV fluids -Check urine osms, sodium, check serum osms  3. HTN, HFpEF:  Appears hypovolemic. -Continue  Crestor -Continue atenolol -Hold full dose aspirin for now -Hold diuretics, ARB  4. Sinusitis:  -Hold Levaquin in the hospital        DVT prophylaxis: Lovenox  Code Status: DO NOT RESUSCITATE  Family Communication: Son at bedside  Disposition Plan: Anticipate IV fluids, monitor creatinine Consults called: None Admission status: INPATIENT    Medical decision making: Patient seen at 10:20 PM on 05/05/2017.  The patient was discussed with Maylon Peppers.  What exists of the patient's  chart was reviewed in depth and summarized above.  Clinical condition: stable.        Edwin Dada [facility] Pager [phone]      At the time of admission, it appears that the appropriate admission status for this patient is INPATIENT. This is judged to be reasonable and necessary in order to provide the required intensity of service to ensure the patient's safety given the presenting symptoms, physical exam findings, and initial radiographic and laboratory data in the context of their chronic comorbidities.  Together, these circumstances are felt to place her at high risk for further clinical deterioration threatening life, limb, or organ.   Patient requires inpatient status due to high intensity of service, high risk for further deterioration and high frequency of surveillance required because of this acute illness that poses a threat to life, limb or bodily function.  I certify that at the point of admission it is my clinical judgment that the patient will require inpatient hospital care spanning beyond 2 midnights from the point of admission and that early discharge would result in unnecessary risk of decompensation and readmission or threat to life, limb or bodily function.

## 2017-05-07 ENCOUNTER — Telehealth: Payer: Self-pay | Admitting: *Deleted

## 2017-05-07 DIAGNOSIS — N179 Acute kidney failure, unspecified: Principal | ICD-10-CM

## 2017-05-07 LAB — CBC WITH DIFFERENTIAL/PLATELET
Basophils Absolute: 0 10*3/uL (ref 0.0–0.1)
Basophils Relative: 0 %
Eosinophils Absolute: 0.1 10*3/uL (ref 0.0–0.7)
Eosinophils Relative: 2 %
HCT: 31.2 % — ABNORMAL LOW (ref 36.0–46.0)
Hemoglobin: 10.5 g/dL — ABNORMAL LOW (ref 12.0–15.0)
Lymphocytes Relative: 12 %
Lymphs Abs: 0.8 10*3/uL (ref 0.7–4.0)
MCH: 30.3 pg (ref 26.0–34.0)
MCHC: 33.7 g/dL (ref 30.0–36.0)
MCV: 89.9 fL (ref 78.0–100.0)
Monocytes Absolute: 0.5 10*3/uL (ref 0.1–1.0)
Monocytes Relative: 8 %
Neutro Abs: 5.1 10*3/uL (ref 1.7–7.7)
Neutrophils Relative %: 78 %
Platelets: 291 10*3/uL (ref 150–400)
RBC: 3.47 MIL/uL — ABNORMAL LOW (ref 3.87–5.11)
RDW: 13.9 % (ref 11.5–15.5)
WBC: 6.5 10*3/uL (ref 4.0–10.5)

## 2017-05-07 LAB — HEPATIC FUNCTION PANEL
ALT: 20 U/L (ref 14–54)
AST: 30 U/L (ref 15–41)
Albumin: 3.6 g/dL (ref 3.5–5.0)
Alkaline Phosphatase: 53 U/L (ref 38–126)
Bilirubin, Direct: <0.1 mg/dL — ABNORMAL LOW (ref 0.1–0.5)
Total Bilirubin: 0.5 mg/dL (ref 0.3–1.2)
Total Protein: 7 g/dL (ref 6.5–8.1)

## 2017-05-07 LAB — URINE CULTURE: Culture: NO GROWTH

## 2017-05-07 LAB — UREA NITROGEN, URINE: Urea Nitrogen, Ur: 248 mg/dL

## 2017-05-07 LAB — PROTEIN / CREATININE RATIO, URINE
Creatinine, Urine: 33.28 mg/dL
Total Protein, Urine: <6 mg/dL

## 2017-05-07 MED ORDER — ROSUVASTATIN CALCIUM 10 MG PO TABS
10.0000 mg | ORAL_TABLET | Freq: Every day | ORAL | Status: DC
  Administered 2017-05-07: 10 mg via ORAL
  Filled 2017-05-07 (×2): qty 1

## 2017-05-07 NOTE — Telephone Encounter (Signed)
Pt is currently at [facility] ER for evaluation.

## 2017-05-07 NOTE — Telephone Encounter (Signed)
  Per chart pt is currently admitted as of 8/4.   PLEASE NOTE: All timestamps contained within this report are represented as [place] Standard Time. CONFIDENTIALTY NOTICE: This fax transmission is intended only for the addressee. It contains information that is legally privileged, confidential or otherwise protected from use or disclosure. If you are not the intended recipient, you are strictly prohibited from reviewing, disclosing, copying using or disseminating any of this information or taking any action in reliance on or regarding this information. If you have received this fax in error, please notify us immediately by telephone so that we can arrange for its return to [place]. Phone: [phone], Toll-Free: [phone], Fax: [phone] Page: 1 of 1 Call Id: [id number] [facility] Night - Client [place] Patient Name: Bianca Matthews Gender: Female DOB: 1928-01-12 Age: 59 Y 61 M 10 D Return Phone Number: [phone] (Primary) City/State/Zip: [place] Client [facility] Night - Client Client Site [facility] - Night Physician Alysia Penna - MD Who Is Calling Patient / Member / Family / Caregiver Call Type Triage / Clinical Caller Name Nija Koopman Relationship To Patient Son Return Phone Number [phone] (Primary) Chief Complaint CONFUSION - new onset Reason for Call Symptomatic / Request for [place] states that his mother is experiencing disorientation, oxygen has been fluctuating and doesn't feel right, having trouble moving. Nurse Assessment Nurse: Venetia Maxon, RN, Manuela Schwartz Date/Time (Eastern Time): 05/05/2017 4:17:37 PM Confirm and document reason for call. If symptomatic, describe symptoms. ---Caller states that his mother is experiencing disorientation, oxygen has been fluctuating and doesn't feel right, having trouble moving. she lives in her  home . Confusion began this am. she has been independent . color good . she has not slept well. last urine was now Does the PT have any chronic conditions? (i.e. diabetes, asthma, etc.) ---Yes List chronic conditions. ---HTN crestor , Tenormin, Losartan Baby ASA , Guidelines Guideline Title Affirmed Question Flank Pain Patient sounds very sick or weak to the triager Disp. Time Eilene Ghazi Time) Disposition Final User 05/05/2017 4:24:03 PM Go to ED Now (or PCP triage) Yes Venetia Maxon, RN, [facility] - ED Care Advice Given Per Guideline GO TO ED NOW (OR PCP TRIAGE): DRIVING: Another adult should drive. CARE ADVICE given per [place] Pain (Adult) guideline.

## 2017-05-07 NOTE — Progress Notes (Signed)
PROGRESS NOTE    Bianca Matthews  RFF:[id number] DOB: [birth year] DOA: 05/05/2017 PCP: Laurey Morale, MD     Brief Narrative:  Bianca Matthews is a 83 yo female with past medical history significant for HFpEF and HTN who presents with altered mental status, weakness, malaise, and fell out of bed. History is gathered from previous note, son at bedside. Normally, she lives by herself, very functional at home, no history of dementia. Her oldest son died in early 05/03/2023 due to cancer. She has been dealing with grief. She also has had some back and neck pain, was prescribed Voltaren by her primary care physician. Over the past 2 days, she hasn't slept at all, has been pacing around her house. She took a brief nap, then rolled out of bed and fell and could not get up. Evaluation in the ED revealed AKI.   Assessment & Plan:   Principal Problem:   AKI (acute kidney injury) ([place]) Active Problems:   Essential hypertension   Diastolic CHF, chronic (HCC)   Morbid obesity due to excess calories (HCC)   Hyponatremia   Sinusitis   Acute metabolic encephalopathy -Combination of insomnia, grief, AKI, hyponatremia  -CT head: No acute intracranial pathology  -Haldol IV if continues to be agitated, seroquel qhs   -Much improved today, worked with PT. Continue to monitor   AKI -In setting of NSAID, lasix, spironolactone, cozaar use, likely ATN vs obstruction. FeNa 4.22%  -Holding lasix, cozaar, spironolactone, stop NSAID -Renal US without acute findings  -UA unremarkable for infection  -Improving with IVF, continue to trend BMP   Hypovolemic hyponatremia -Resolved with IVF   Chronic diastolic heart failure -Stable -Hold lasix, spironolactone   HTN -Continue atenolol  -Hold lasix, spironolactone, cozaar   HLD -Crestor   Acute urinary retention -Foley placed. Remove foley today, voiding trial   Grief -Appropriate grief given her son passing in 05/03/23     DVT prophylaxis: lovenox Code  Status: DNR Family Communication: Son at bedside  Disposition Plan: pending improvement AMS. If continues to be stable without IV haldol over the next 24 hours, consider DC home with [facility] services    Consultants:   None  Procedures:   None   Antimicrobials:  Anti-infectives    None       Subjective: Much improved this morning. She is able to recognize me from yesterday, tells me why she came to the hospital, and denies any physical complaints. She worked with PT this morning as well. She did require 1 dose of IV haldol overnight due to agitation.   Objective: Vitals:   05/06/17 1500 05/06/17 2027 05/07/17 0029 05/07/17 0431  BP: 128/67 (!) 142/58  138/86  Pulse: 78 67  90  Resp: 18 18  18   Temp: 97.6 F (36.4 C) 98 F (36.7 C)  98.6 F (37 C)  TempSrc: Oral Oral  Oral  SpO2: 98% 99%  98%  Weight:   74.8 kg (164 lb 14.4 oz)   Height:        Intake/Output Summary (Last 24 hours) at 05/07/17 1127 Last data filed at 05/07/17 1000  Gross per 24 hour  Intake          1119.17 ml  Output             3350 ml  Net         -2230.83 ml   Filed Weights   05/05/17 1950 05/06/17 0033 05/07/17 0029  Weight: 76.7 kg (169 lb 3.2 oz)  75 kg (165 lb 5.5 oz) 74.8 kg (164 lb 14.4 oz)    Examination:  General exam: Appears calm and comfortable  Respiratory system: Clear to auscultation. Respiratory effort normal. Cardiovascular system: S1 & S2 heard, RRR. No JVD, murmurs, rubs, gallops or clicks. No pedal edema. Gastrointestinal system: Abdomen is nondistended, soft and nontender. No organomegaly or masses felt. Normal bowel sounds heard. Central nervous system: Alert, speech fluent, nonfocal exam  Extremities: Symmetric  Skin: No rashes, lesions or ulcers Psychiatry: Less confused, more appropriate this morning.   Data Reviewed: I have personally reviewed following labs and imaging studies  CBC:  Recent Labs Lab 05/05/17 2041 05/06/17 0347 05/07/17 0413  WBC 8.8 8.2 6.5    NEUTROABS  --   --  5.1  HGB 12.0 12.3 10.5*  HCT 33.0* 35.0* 31.2*  MCV 86.4 86.8 89.9  PLT 313 317 824   Basic Metabolic Panel:  Recent Labs Lab 05/05/17 2041 05/05/17 2219 05/06/17 0347 05/06/17 1304 05/06/17 2129  NA 123*  --  127* 133* 136  K 5.3*  --  5.1 4.5 4.3  CL 87*  --  94* 100* 104  CO2 19*  --  16* 22 22  GLUCOSE 102*  --  110* 119* 125*  BUN 94*  --  82* 70* 54*  CREATININE 3.46*  --  2.64* 1.97* 1.64*  CALCIUM 9.6  --  9.3 9.3 9.1  MG  --  2.1  --   --   --    GFR: Estimated Creatinine Clearance: 23.9 mL/min (A) (by C-G formula based on SCr of 1.64 mg/dL (H)). Liver Function Tests:  Recent Labs Lab 05/05/17 2219 05/07/17 0413  AST 72* 30  ALT 13* 20  ALKPHOS 51 53  BILITOT 1.8* 0.5  PROT 6.3* 7.0  ALBUMIN 3.3* 3.6   No results for input(s): LIPASE, AMYLASE in the last 168 hours.  Recent Labs Lab 05/06/17 1304  AMMONIA 13   Coagulation Profile:  Recent Labs Lab 05/05/17 2219  INR 1.10   Cardiac Enzymes: No results for input(s): CKTOTAL, CKMB, CKMBINDEX, TROPONINI in the last 168 hours. BNP (last 3 results)  Recent Labs  11/03/16 1302  PROBNP 239.0*   HbA1C: No results for input(s): HGBA1C in the last 72 hours. CBG:  Recent Labs Lab 05/05/17 1953  GLUCAP 98   Lipid Profile: No results for input(s): CHOL, HDL, LDLCALC, TRIG, CHOLHDL, LDLDIRECT in the last 72 hours. Thyroid Function Tests:  Recent Labs  05/06/17 0347  TSH 3.962   Anemia Panel: No results for input(s): VITAMINB12, FOLATE, FERRITIN, TIBC, IRON, RETICCTPCT in the last 72 hours. Sepsis Labs:  Recent Labs Lab 05/05/17 2349  LATICACIDVEN 0.61    Recent Results (from the past 240 hour(s))  Culture, Urine     Status: None   Collection Time: 05/06/17  4:37 AM  Result Value Ref Range Status   Specimen Description URINE, CATHETERIZED  Final   Special Requests NONE  Final   Culture   Final    NO GROWTH Performed at [facility], [address] [place], [place] [address]    Report Status 05/07/2017 FINAL  Final       Radiology Studies: Dg Chest 2 View  Result Date: 05/05/2017 CLINICAL DATA:  83 year old female with altered mental status. EXAM: CHEST  2 VIEW COMPARISON:  Chest radiograph dated 11/03/2016 FINDINGS: There is mild eventration of the right hemidiaphragm. There bibasilar atelectatic changes. No focal consolidation, pleural effusion, or pneumothorax. The  cardiac silhouette is within normal limits. There is atherosclerotic calcification of the aorta. No acute osseous pathology. IMPRESSION: No acute cardiopulmonary process.  No interval change. Electronically Signed   By: Anner Crete M.D.   On: 05/05/2017 22:50   Ct Head Wo Contrast  Result Date: 05/05/2017 CLINICAL DATA:  Acute onset of altered level of consciousness. Initial encounter. EXAM: CT HEAD WITHOUT CONTRAST TECHNIQUE: Contiguous axial images were obtained from the base of the skull through the vertex without intravenous contrast. COMPARISON:  CT of the head performed 10/08/2016 FINDINGS: Brain: No evidence of acute infarction, hemorrhage, hydrocephalus, extra-axial collection or mass lesion/mass effect. Prominence of the ventricles and sulci reflects mild to moderate cortical volume loss. Cerebellar atrophy is noted. Scattered periventricular and subcortical white matter change likely reflects small vessel ischemic microangiopathy. The brainstem and fourth ventricle are within normal limits. The basal ganglia are unremarkable in appearance. The cerebral hemispheres demonstrate grossly normal gray-white differentiation. No mass effect or midline shift is seen. Vascular: No hyperdense vessel or unexpected calcification. Skull: There is no evidence of fracture; visualized osseous structures are unremarkable in appearance. Sinuses/Orbits: The visualized portions of the orbits are within normal limits. The paranasal sinuses and mastoid air cells are well-aerated. Other: No  significant soft tissue abnormalities are seen. IMPRESSION: 1. No acute intracranial pathology seen on CT. 2. Mild to moderate cortical volume loss and scattered small vessel ischemic microangiopathy. Electronically Signed   By: Garald Balding M.D.   On: 05/05/2017 23:19   US Renal  Result Date: 05/06/2017 CLINICAL DATA:  Acute kidney injury.  Urinary retention. EXAM: RENAL / URINARY TRACT ULTRASOUND COMPLETE COMPARISON:  None. FINDINGS: Right Kidney: Length: 10.6 cm. Mildly increased renal parenchymal echogenicity. No mass or hydronephrosis visualized. Left Kidney: Length: 10.6 cm. Mildly increased renal parenchymal echogenicity. No mass or hydronephrosis visualized. Bladder: Appears normal for degree of bladder distention. IMPRESSION: Mildly increased renal parenchymal echogenicity, consistent with medical renal disease. No evidence of hydronephrosis. Electronically Signed   By: Earle Gell M.D.   On: 05/06/2017 11:37      Scheduled Meds: . acidophilus  1 capsule Oral Daily  . atenolol  100 mg Oral BID  . enoxaparin (LOVENOX) injection  30 mg Subcutaneous Q24H  . loratadine  10 mg Oral Daily  . QUEtiapine  25 mg Oral QHS  . rosuvastatin  10 mg Oral QHS   Continuous Infusions: . sodium chloride 125 mL/hr at 05/07/17 0141     LOS: 2 days    Time spent: 30 minutes   Dessa Phi, DO [facility] [email or web address] Password [email or web address] 05/07/2017, 11:27 AM

## 2017-05-07 NOTE — Progress Notes (Signed)
Pt's confusion is resolving. She has been alert and oriented and cooperative. Transferring well from bed to chair with one assist. Using call light for her needs. Sat up in chair x2 for a few hours and took a couple naps today. Pt keeps saying she's feeling much better today. Appetite is also improving.

## 2017-05-07 NOTE — Evaluation (Signed)
Physical Therapy Evaluation Patient Details Name: Bianca Matthews MRN: [id number] DOB: [birth year] Today's Date: 05/07/2017   History of Present Illness   83 y.o. female with a past medical history significant for HFpEF and HTN who presents with altered mental status, weakness and malaise and fell out of bed. Dx of AKI. Pt's son recently died.   Clinical Impression  Pt admitted with above diagnosis. Pt currently with functional limitations due to the deficits listed below (see PT Problem List). Min A for bed mobility, transfers, and to ambulate 12'x 2. Activity limited by confusion. Good progress expected if confusion resolves.  Pt will benefit from skilled PT to increase their independence and safety with mobility to allow discharge to the venue listed below.       Follow Up Recommendations Supervision/Assistance - 24 hour;Home health PT    Equipment Recommendations  None recommended by PT    Recommendations for Other Services       Precautions / Restrictions Precautions Precautions: Fall Precaution Comments: confusion Restrictions Weight Bearing Restrictions: No      Mobility  Bed Mobility Overal bed mobility: Needs Assistance Bed Mobility: Supine to Sit     Supine to sit: Min assist     General bed mobility comments: min A to raise trunk  Transfers Overall transfer level: Needs assistance Equipment used: 1 person hand held assist Transfers: Sit to/from Stand Sit to Stand: Min assist         General transfer comment: min A to rise/steady  Ambulation/Gait Ambulation/Gait assistance: Min assist Ambulation Distance (Feet): 24 Feet Assistive device: 1 person hand held assist Gait Pattern/deviations: Decreased step length - right;Decreased step length - left   Gait velocity interpretation: Below normal speed for age/gender General Gait Details: min HHA to steady, 12' x 2 to/from bathroom, distance limited by confusion, manual cues to direct pt back to bed (pt  perseverating on needing to urinate, wanted to sit on commode for extended time, has foley catheter)  Stairs            Wheelchair Mobility    Modified Rankin (Stroke Patients Only)       Balance Overall balance assessment: Needs assistance   Sitting balance-Leahy Scale: Good       Standing balance-Leahy Scale: Fair                               Pertinent Vitals/Pain Pain Assessment: Faces Faces Pain Scale: No hurt    Home Living Family/patient expects to be discharged to:: Private residence Living Arrangements: Alone   Type of Home: House Home Access: Level entry     Home Layout: One level Home Equipment: None Additional Comments: pt unable to recall if she has stairs in her home or if she drives. Above info obtained from PT/OT eval October 10, 2016    Prior Function           Comments: Pt was living alone and independently.      Hand Dominance   Dominant Hand: Left    Extremity/Trunk Assessment   Upper Extremity Assessment Upper Extremity Assessment: Defer to OT evaluation    Lower Extremity Assessment Lower Extremity Assessment: Generalized weakness (-4/5 B knees)    Cervical / Trunk Assessment Cervical / Trunk Assessment: Normal  Communication   Communication: HOH  Cognition Arousal/Alertness: Awake/alert Behavior During Therapy: WFL for tasks assessed/performed Overall Cognitive Status: Impaired/Different from baseline Area of Impairment: Memory;Problem solving  Problem Solving: Slow processing General Comments: oriented to place, self, month but not year. cannot recall how she gets groceries, if she drives, or if there are stairs in her home. Foley catheter is very confusing to pt ("how can I pee in a bag? I only know how to pee in a toilet.")      General Comments      Exercises     Assessment/Plan    PT Assessment Patient needs continued PT services  PT Problem List  Decreased strength;Decreased activity tolerance;Decreased balance;Decreased mobility;Decreased cognition       PT Treatment Interventions Gait training;Functional mobility training;Therapeutic activities;Therapeutic exercise;Patient/family education    PT Goals (Current goals can be found in the Care Plan section)  Acute Rehab PT Goals PT Goal Formulation: Patient unable to participate in goal setting Time For Goal Achievement: 05/21/17 Potential to Achieve Goals: Good    Frequency Min 3X/week   Barriers to discharge        Co-evaluation               AM-PAC PT "6 Clicks" Daily Activity  Outcome Measure Difficulty turning over in bed (including adjusting bedclothes, sheets and blankets)?: Total Difficulty moving from lying on back to sitting on the side of the bed? : Total Difficulty sitting down on and standing up from a chair with arms (e.g., wheelchair, bedside commode, etc,.)?: Total Help needed moving to and from a bed to chair (including a wheelchair)?: A Little Help needed walking in hospital room?: A Little Help needed climbing 3-5 steps with a railing? : A Lot 6 Click Score: 11    End of Session Equipment Utilized During Treatment: Gait belt Activity Tolerance: Patient tolerated treatment well Patient left: in bed;with bed alarm set;with call bell/phone within reach Nurse Communication: Mobility status PT Visit Diagnosis: Unsteadiness on feet (R26.81);Muscle weakness (generalized) (M62.81);Difficulty in walking, not elsewhere classified (R26.2)    Time: 0156-1537 PT Time Calculation (min) (ACUTE ONLY): 30 min   Charges:   PT Evaluation $PT Eval Low Complexity: 1 Low PT Treatments $Gait Training: 8-22 mins   PT G Codes:          Philomena Doheny 05/07/2017, 9:12 AM [phone]

## 2017-05-07 NOTE — Progress Notes (Addendum)
Pt. Kept trying to get out of bed. I called the [facility] to request a sitter. She said since we have 2 techs, one can sit with her. My tech sat with her when able, otherwise we both sat near her room and kept a close watch.  Bed alarm is on. Will pass on information to day shift.

## 2017-05-08 LAB — CBC WITH DIFFERENTIAL/PLATELET
Basophils Absolute: 0 10*3/uL (ref 0.0–0.1)
Basophils Relative: 0 %
Eosinophils Absolute: 0.3 10*3/uL (ref 0.0–0.7)
Eosinophils Relative: 4 %
HCT: 29 % — ABNORMAL LOW (ref 36.0–46.0)
Hemoglobin: 9.7 g/dL — ABNORMAL LOW (ref 12.0–15.0)
Lymphocytes Relative: 18 %
Lymphs Abs: 1.2 10*3/uL (ref 0.7–4.0)
MCH: 29.9 pg (ref 26.0–34.0)
MCHC: 33.4 g/dL (ref 30.0–36.0)
MCV: 89.5 fL (ref 78.0–100.0)
Monocytes Absolute: 0.5 10*3/uL (ref 0.1–1.0)
Monocytes Relative: 7 %
Neutro Abs: 4.9 10*3/uL (ref 1.7–7.7)
Neutrophils Relative %: 71 %
Platelets: 247 10*3/uL (ref 150–400)
RBC: 3.24 MIL/uL — ABNORMAL LOW (ref 3.87–5.11)
RDW: 14 % (ref 11.5–15.5)
WBC: 6.8 10*3/uL (ref 4.0–10.5)

## 2017-05-08 LAB — BASIC METABOLIC PANEL
Anion gap: 8 (ref 5–15)
BUN: 38 mg/dL — ABNORMAL HIGH (ref 6–20)
CO2: 22 mmol/L (ref 22–32)
Calcium: 8.9 mg/dL (ref 8.9–10.3)
Chloride: 109 mmol/L (ref 101–111)
Creatinine, Ser: 0.89 mg/dL (ref 0.44–1.00)
GFR calc Af Amer: >60 mL/min (ref >60)
GFR calc non Af Amer: 57 mL/min — ABNORMAL LOW (ref >60)
Glucose, Bld: 106 mg/dL — ABNORMAL HIGH (ref 65–99)
Potassium: 4.3 mmol/L (ref 3.5–5.1)
Sodium: 139 mmol/L (ref 135–145)

## 2017-05-08 MED ORDER — QUETIAPINE FUMARATE 25 MG PO TABS: 25.0000 mg | ORAL_TABLET | Freq: Every evening | ORAL | 0 refills | Status: DC | PRN

## 2017-05-08 MED ORDER — POLYETHYLENE GLYCOL 3350 17 G PO PACK: 17.0000 g | PACK | Freq: Every day | ORAL | 0 refills | Status: AC | PRN

## 2017-05-08 NOTE — Care Management Note (Signed)
Case Management Note  Patient Details  Name: Trevia Nop MRN: [id number] Date of Birth: Nov 21, 1927  Subjective/Objective: 83 yo admitted with AKI                   Action/Plan: From home alone. Met with pt and family at bedside to offer choice for home health services. [facility] chosen and [facility] alerted of referral. Will need MD order for HHPT/OT.  Expected Discharge Date:                  Expected Discharge Plan:  [place]  In-House Referral:     Discharge planning Services  CM Consult  Post Acute Care Choice:  Home Health Choice offered to:  Adult Children, Patient  DME Arranged:    DME Agency:     HH Arranged:  PT, OT [place] Agency:  [place]  Status of Service:  In process, will continue to follow  If discussed at Long Length of Stay Meetings, dates discussed:    Additional CommentsLynnell Catalan, RN 05/08/2017, 11:14 AM  [phone]

## 2017-05-08 NOTE — Discharge Summary (Signed)
Physician Discharge Summary  Bianca Matthews HEN:[id number] DOB: 1928-01-06 DOA: 05/05/2017  PCP: Laurey Morale, MD  Admit date: 05/05/2017 Discharge date: 05/08/2017  Admitted From: Home Disposition:  Home  Recommendations for Outpatient Follow-up:  1. Follow up with PCP in 1 week 2. Please obtain BMP in 1 week 3. Stop cozaar, spironolactone, lasix until follow up BMP to ensure stability in kidney function. Resume as directed by your PCP  4. Stop NSAID in setting of AKI   Home Health: PT OT RN Aide  Equipment/Devices: None   Discharge Condition: Stable CODE STATUS: DNR  Diet recommendation: Heart healthy   Brief/Interim Summary: Bianca Matthews is a 83 yo female with past medical history significant for HFpEF and HTNwho presents with altered mental status, weakness, malaise, and fell out of bed. History is gathered from previous note, son at bedside. Normally, she lives by herself, very functional at home, no history of dementia. Her oldest son died in early 04-20-2023 due to cancer. She has been dealing with grief. She also has had some back and neck pain, was prescribed Voltaren by her primary care physician. Over the past 2 days, she hasn't slept at all, has been pacing around her house. She took a brief nap, then rolled out of bed and fell and could not get up. Evaluation in the ED revealed AKI.   Acute metabolic encephalopathy -Combination of insomnia, grief, AKI, hyponatremia  -CT head: No acute intracranial pathology  -Resolved. Has not required haldol last night. Alert, oriented, appropriate this morning. Will send home with seroquel qhs prn   AKI -In setting of NSAID, lasix, spironolactone, cozaar use, likely ATN vs obstruction. FeNa 4.22%  -Holding lasix, cozaar, spironolactone, stop NSAID -Renal US without acute findings  -UA unremarkable for infection  -Resolved with IVF   Hypovolemic hyponatremia -Resolved with IVF   Chronic diastolic heart failure -Stable -Hold lasix,  spironolactone   HTN -Continue atenolol  -Hold lasix, spironolactone, cozaar   HLD -Crestor   Acute urinary retention -Foley placed. Remove foley yesterday and voiding spontaneously now.   Grief -Appropriate grief given her son passing in 20-Apr-2023    Discharge Instructions  Discharge Instructions    Call MD for:    Complete by:  As directed    Confusion   Call MD for:  difficulty breathing, headache or visual disturbances    Complete by:  As directed    Call MD for:  extreme fatigue    Complete by:  As directed    Call MD for:  hives    Complete by:  As directed    Call MD for:  persistant dizziness or light-headedness    Complete by:  As directed    Call MD for:  persistant nausea and vomiting    Complete by:  As directed    Call MD for:  severe uncontrolled pain    Complete by:  As directed    Call MD for:  temperature >100.4    Complete by:  As directed    Diet - low sodium heart healthy    Complete by:  As directed    Discharge instructions    Complete by:  As directed    You were cared for by a hospitalist during your hospital stay. If you have any questions about your discharge medications or the care you received while you were in the hospital after you are discharged, you can call the unit and asked to speak with the hospitalist on call if the  hospitalist that took care of you is not available. Once you are discharged, your primary care physician will handle any further medical issues. Please note that NO REFILLS for any discharge medications will be authorized once you are discharged, as it is imperative that you return to your primary care physician (or establish a relationship with a primary care physician if you do not have one) for your aftercare needs so that they can reassess your need for medications and monitor your lab values.   Discharge instructions    Complete by:  As directed    Avoid all ibuprofen, NSAIDs   Increase activity slowly    Complete by:   As directed      Allergies as of 05/08/2017   No Known Allergies     Medication List    STOP taking these medications   furosemide 40 MG tablet Commonly known as:  LASIX   losartan 100 MG tablet Commonly known as:  COZAAR   spironolactone 25 MG tablet Commonly known as:  ALDACTONE     TAKE these medications   aspirin 325 MG tablet Take 325 mg by mouth at bedtime.   atenolol 100 MG tablet Commonly known as:  TENORMIN Take 100 mg by mouth 2 (two) times daily.   b complex vitamins tablet Take 1 tablet by mouth daily.   DYMISTA 137-50 MCG/ACT Susp Generic drug:  Azelastine-Fluticasone Place 1 spray into the nose daily.   levocetirizine 2.5 MG/5ML solution Commonly known as:  XYZAL Take 2.5 mg by mouth daily.   Magnesium 250 MG Tabs Take 1 tablet by mouth daily.   multivitamin with minerals Tabs tablet Take 1 tablet by mouth daily. Women's One a Day   omeprazole 40 MG capsule Commonly known as:  PRILOSEC TAKE 1 CAPSULE EVERY DAY   polyethylene glycol packet Commonly known as:  MIRALAX / GLYCOLAX Take 17 g by mouth daily as needed for mild constipation.   PROBIOTIC FORMULA PO Take 1 tablet by mouth daily.   QUEtiapine 25 MG tablet Commonly known as:  SEROQUEL Take 1 tablet (25 mg total) by mouth at bedtime as needed (for agitation, insomnia).   rosuvastatin 10 MG tablet Commonly known as:  CRESTOR TAKE 1 TABLET AT BEDTIME      Follow-up Information    [facility].   Why:  For home health services Contact information: [place] [address] [phone]        Laurey Morale, MD. Schedule an appointment as soon as possible for a visit in 1 week(s).   Specialty:  Family Medicine Why:  Follow up in the next week for repeat blood work  Contact information: [place] [address] [phone]          No Known  Allergies  Consultations:  None   Procedures/Studies: Dg Chest 2 View  Result Date: 05/05/2017 CLINICAL DATA:  83 year old female with altered mental status. EXAM: CHEST  2 VIEW COMPARISON:  Chest radiograph dated 11/03/2016 FINDINGS: There is mild eventration of the right hemidiaphragm. There bibasilar atelectatic changes. No focal consolidation, pleural effusion, or pneumothorax. The cardiac silhouette is within normal limits. There is atherosclerotic calcification of the aorta. No acute osseous pathology. IMPRESSION: No acute cardiopulmonary process.  No interval change. Electronically Signed   By: Anner Crete M.D.   On: 05/05/2017 22:50   Ct Head Wo Contrast  Result Date: 05/05/2017 CLINICAL DATA:  Acute onset of altered level of consciousness.  Initial encounter. EXAM: CT HEAD WITHOUT CONTRAST TECHNIQUE: Contiguous axial images were obtained from the base of the skull through the vertex without intravenous contrast. COMPARISON:  CT of the head performed 10/08/2016 FINDINGS: Brain: No evidence of acute infarction, hemorrhage, hydrocephalus, extra-axial collection or mass lesion/mass effect. Prominence of the ventricles and sulci reflects mild to moderate cortical volume loss. Cerebellar atrophy is noted. Scattered periventricular and subcortical white matter change likely reflects small vessel ischemic microangiopathy. The brainstem and fourth ventricle are within normal limits. The basal ganglia are unremarkable in appearance. The cerebral hemispheres demonstrate grossly normal gray-white differentiation. No mass effect or midline shift is seen. Vascular: No hyperdense vessel or unexpected calcification. Skull: There is no evidence of fracture; visualized osseous structures are unremarkable in appearance. Sinuses/Orbits: The visualized portions of the orbits are within normal limits. The paranasal sinuses and mastoid air cells are well-aerated. Other: No significant soft tissue abnormalities are  seen. IMPRESSION: 1. No acute intracranial pathology seen on CT. 2. Mild to moderate cortical volume loss and scattered small vessel ischemic microangiopathy. Electronically Signed   By: Garald Balding M.D.   On: 05/05/2017 23:19   US Renal  Result Date: 05/06/2017 CLINICAL DATA:  Acute kidney injury.  Urinary retention. EXAM: RENAL / URINARY TRACT ULTRASOUND COMPLETE COMPARISON:  None. FINDINGS: Right Kidney: Length: 10.6 cm. Mildly increased renal parenchymal echogenicity. No mass or hydronephrosis visualized. Left Kidney: Length: 10.6 cm. Mildly increased renal parenchymal echogenicity. No mass or hydronephrosis visualized. Bladder: Appears normal for degree of bladder distention. IMPRESSION: Mildly increased renal parenchymal echogenicity, consistent with medical renal disease. No evidence of hydronephrosis. Electronically Signed   By: Earle Gell M.D.   On: 05/06/2017 11:37       Discharge Exam: Vitals:   05/07/17 2054 05/08/17 0434  BP: 124/67 (!) 146/63  Pulse: (!) 57 69  Resp: 16 16  Temp: 98.1 F (36.7 C) 97.9 F (36.6 C)   Vitals:   05/07/17 0029 05/07/17 0431 05/07/17 2054 05/08/17 0434  BP:  138/86 124/67 (!) 146/63  Pulse:  90 (!) 57 69  Resp:  18 16 16   Temp:  98.6 F (37 C) 98.1 F (36.7 C) 97.9 F (36.6 C)  TempSrc:  Oral Oral Oral  SpO2:  98% 98% 93%  Weight: 74.8 kg (164 lb 14.4 oz)   74.8 kg (165 lb)  Height:        General: Pt is alert, awake, not in acute distress Cardiovascular: RRR, S1/S2 +, no rubs, no gallops Respiratory: CTA bilaterally, no wheezing, no rhonchi Abdominal: Soft, NT, ND, bowel sounds + Extremities: no edema, no cyanosis    The results of significant diagnostics from this hospitalization (including imaging, microbiology, ancillary and laboratory) are listed below for reference.     Microbiology: Recent Results (from the past 240 hour(s))  Culture, Urine     Status: None   Collection Time: 05/06/17  4:37 AM  Result Value Ref Range  Status   Specimen Description URINE, CATHETERIZED  Final   Special Requests NONE  Final   Culture   Final    NO GROWTH Performed at [facility], [address], [place], [place] [address]    Report Status 05/07/2017 FINAL  Final     Labs: BNP (last 3 results)  Recent Labs  10/08/16 1838 05/05/17 2219  BNP 296.9* 938.1*   Basic Metabolic Panel:  Recent Labs Lab 05/05/17 2041 05/05/17 2219 05/06/17 0347 05/06/17 1304 05/06/17 2129 05/08/17 0410  NA 123*  --  127* 133* 136 139  K 5.3*  --  5.1 4.5 4.3 4.3  CL 87*  --  94* 100* 104 109  CO2 19*  --  16* 22 22 22   GLUCOSE 102*  --  110* 119* 125* 106*  BUN 94*  --  82* 70* 54* 38*  CREATININE 3.46*  --  2.64* 1.97* 1.64* 0.89  CALCIUM 9.6  --  9.3 9.3 9.1 8.9  MG  --  2.1  --   --   --   --    Liver Function Tests:  Recent Labs Lab 05/05/17 2219 05/07/17 0413  AST 72* 30  ALT 13* 20  ALKPHOS 51 53  BILITOT 1.8* 0.5  PROT 6.3* 7.0  ALBUMIN 3.3* 3.6   No results for input(s): LIPASE, AMYLASE in the last 168 hours.  Recent Labs Lab 05/06/17 1304  AMMONIA 13   CBC:  Recent Labs Lab 05/05/17 2041 05/06/17 0347 05/07/17 0413 05/08/17 0410  WBC 8.8 8.2 6.5 6.8  NEUTROABS  --   --  5.1 4.9  HGB 12.0 12.3 10.5* 9.7*  HCT 33.0* 35.0* 31.2* 29.0*  MCV 86.4 86.8 89.9 89.5  PLT 313 317 291 247   Cardiac Enzymes: No results for input(s): CKTOTAL, CKMB, CKMBINDEX, TROPONINI in the last 168 hours. BNP: Invalid input(s): POCBNP CBG:  Recent Labs Lab 05/05/17 1953  GLUCAP 98   D-Dimer No results for input(s): DDIMER in the last 72 hours. Hgb A1c No results for input(s): HGBA1C in the last 72 hours. Lipid Profile No results for input(s): CHOL, HDL, LDLCALC, TRIG, CHOLHDL, LDLDIRECT in the last 72 hours. Thyroid function studies  Recent Labs  05/06/17 0347  TSH 3.962   Anemia work up No results for input(s): VITAMINB12, FOLATE, FERRITIN, TIBC, IRON, RETICCTPCT in the last 72  hours. Urinalysis    Component Value Date/Time   COLORURINE STRAW (A) 05/06/2017 1430   APPEARANCEUR CLEAR 05/06/2017 1430   LABSPEC 1.008 05/06/2017 1430   PHURINE 5.0 05/06/2017 1430   GLUCOSEU NEGATIVE 05/06/2017 1430   HGBUR NEGATIVE 05/06/2017 1430   BILIRUBINUR NEGATIVE 05/06/2017 1430   BILIRUBINUR n 02/24/2015 0937   KETONESUR 5 (A) 05/06/2017 1430   PROTEINUR NEGATIVE 05/06/2017 1430   UROBILINOGEN 0.2 02/24/2015 0937   NITRITE NEGATIVE 05/06/2017 1430   LEUKOCYTESUR NEGATIVE 05/06/2017 1430   Sepsis Labs Invalid input(s): PROCALCITONIN,  WBC,  LACTICIDVEN Microbiology Recent Results (from the past 240 hour(s))  Culture, Urine     Status: None   Collection Time: 05/06/17  4:37 AM  Result Value Ref Range Status   Specimen Description URINE, CATHETERIZED  Final   Special Requests NONE  Final   Culture   Final    NO GROWTH Performed at [facility], [place] [address], [place], [place] [address]    Report Status 05/07/2017 FINAL  Final     Time coordinating discharge: 40 minutes  SIGNED:  Dessa Phi, DO [facility] Pager [phone]  If 7PM-7AM, please contact night-coverage [email or web address] Password [email or web address] 05/08/2017, 12:30 PM

## 2017-05-08 NOTE — Evaluation (Signed)
Occupational Therapy Evaluation Patient Details Name: Bianca Matthews MRN: [id number] DOB: Aug 31, 1928 Today's Date: 05/08/2017    History of Present Illness  83 y.o. female with a past medical history significant for HFpEF and HTN who presents with altered mental status, weakness and malaise and fell out of bed. Dx of AKI. Pt's son recently died.    Clinical Impression   Pt was admitted for the above.  Pt's cognition WFLs during OT evaluation this am.  She overall needs supervision to min guard when ambulating for adls.  Goals in acute are for mod I level. Recommend HHOT for IADLs as pt lived alone prior to admission    Follow Up Recommendations  Home health OT;Supervision/Assistance - 24 hour    Equipment Recommendations  None recommended by OT    Recommendations for Other Services       Precautions / Restrictions Precautions Precautions: Fall Restrictions Weight Bearing Restrictions: No      Mobility Bed Mobility               General bed mobility comments: OOB  Transfers Overall transfer level: Needs assistance Equipment used: None   Sit to Stand: Supervision              Balance                                           ADL either performed or assessed with clinical judgement   ADL Overall ADL's : Needs assistance/impaired     Grooming: Oral care;Standing;Supervision/safety   Upper Body Bathing: Set up;Sitting   Lower Body Bathing: Supervison/ safety;Sit to/from stand;Set up   Upper Body Dressing : Set up;Sitting   Lower Body Dressing: Supervision/safety;Sit to/from stand;Set up   Toilet Transfer: Supervision/safety;Min guard;Ambulation   Toileting- Clothing Manipulation and Hygiene: Supervision/safety;Sit to/from stand         General ADL Comments: pt was in the bathroom when I arrived.  Ambulated in hall after using toilet and brushing teeth     Vision         Perception     Praxis      Pertinent Vitals/Pain  Pain Assessment: No/denies pain     Hand Dominance     Extremity/Trunk Assessment Upper Extremity Assessment Upper Extremity Assessment: Generalized weakness           Communication Communication Communication: HOH   Cognition Arousal/Alertness: Awake/alert Behavior During Therapy: WFL for tasks assessed/performed Overall Cognitive Status: Within Functional Limits for tasks assessed                                 General Comments: oriented x 3   General Comments       Exercises     Shoulder Instructions      Home Living Family/patient expects to be discharged to:: Private residence Living Arrangements: Alone Available Help at Discharge: Family;Available 24 hours/day               Bathroom Shower/Tub: Occupational psychologist: Standard     Home Equipment: Bedside commode;Shower seat          Prior Functioning/Environment Level of Independence: Independent                 OT Problem List: Decreased strength;Decreased activity tolerance;Decreased knowledge of use of DME or  AE      OT Treatment/Interventions: Self-care/ADL training;DME and/or AE instruction;Patient/family education;Therapeutic activities    OT Goals(Current goals can be found in the care plan section) Acute Rehab OT Goals Patient Stated Goal: home; daughter from [facility] is staying x 2 weeks OT Goal Formulation: With patient Time For Goal Achievement: 05/15/17 Potential to Achieve Goals: Good ADL Goals Pt Will Transfer to Toilet: with modified independence;ambulating Additional ADL Goal #1: pt will gather clothes and complete adl at mod I level  OT Frequency: Min 2X/week   Barriers to D/C:            Co-evaluation              AM-PAC PT "6 Clicks" Daily Activity     Outcome Measure Help from another person eating meals?: None Help from another person taking care of personal grooming?: A Little Help from another person toileting, which includes  using toliet, bedpan, or urinal?: A Little Help from another person bathing (including washing, rinsing, drying)?: A Little Help from another person to put on and taking off regular upper body clothing?: A Little Help from another person to put on and taking off regular lower body clothing?: A Little 6 Click Score: 19   End of Session    Activity Tolerance: Patient tolerated treatment well Patient left: in chair;with call bell/phone within reach;with chair alarm set  OT Visit Diagnosis: Muscle weakness (generalized) (M62.81)                Time: 6015-6153 OT Time Calculation (min): 16 min Charges:  OT General Charges $OT Visit: 1 Procedure OT Evaluation $OT Eval Low Complexity: 1 Procedure G-Codes:     [place], OTR/L [phone] 05/08/2017  Decorian Schuenemann 05/08/2017, 9:58 AM

## 2017-05-08 NOTE — Progress Notes (Signed)
Discharge instructions reviewed with patient and daughter.  Patient discharged to home in the care of her daughrer and son.

## 2017-05-10 ENCOUNTER — Telehealth: Payer: Self-pay

## 2017-05-10 NOTE — Telephone Encounter (Signed)
D/C 05/08/17 To: home  Spoke with pt's daughter, with pt present, and she states that patient is doing well. Home health nurse came to house today for first visit and that went well. Pt denies any pain or urinary issues. She has been trying to stay well hydrated. They have no questions or concerns at this time.  Appt scheduled with Dr. Sarajane Jews 05/11/17, pt aware.   Transition Care Management Follow-up Telephone Call  How have you been since you were released from the hospital? Improving slowly   Do you understand why you were in the hospital? yes   Do you understand the discharge instrcutions? yes  Items Reviewed:  Medications reviewed: yes  Allergies reviewed: yes  Dietary changes reviewed: yes  Referrals reviewed: yes   Functional Questionnaire:   Activities of Daily Living (ADLs):   She states they are independent in the following: feeding and toileting States they require assistance with the following: ambulation, bathing and hygiene, continence, grooming and dressing   Any transportation issues/concerns?: no   Any patient concerns? no   Confirmed importance and date/time of follow-up visits scheduled: yes   Confirmed with patient if condition begins to worsen call PCP or go to the ER.  Patient was given the Call-a-Nurse line [phone]: yes

## 2017-05-11 ENCOUNTER — Ambulatory Visit (INDEPENDENT_AMBULATORY_CARE_PROVIDER_SITE_OTHER): Payer: Medicare PPO | Admitting: Family Medicine

## 2017-05-11 ENCOUNTER — Telehealth: Payer: Self-pay | Admitting: Family Medicine

## 2017-05-11 ENCOUNTER — Encounter: Payer: Self-pay | Admitting: Family Medicine

## 2017-05-11 VITALS — BP 136/76 | HR 64 | Temp 97.6°F | Ht 64.0 in | Wt 177.0 lb

## 2017-05-11 DIAGNOSIS — I10 Essential (primary) hypertension: Secondary | ICD-10-CM | POA: Diagnosis not present

## 2017-05-11 DIAGNOSIS — R609 Edema, unspecified: Secondary | ICD-10-CM | POA: Diagnosis not present

## 2017-05-11 DIAGNOSIS — Z87448 Personal history of other diseases of urinary system: Secondary | ICD-10-CM

## 2017-05-11 DIAGNOSIS — N179 Acute kidney failure, unspecified: Secondary | ICD-10-CM

## 2017-05-11 DIAGNOSIS — I5032 Chronic diastolic (congestive) heart failure: Secondary | ICD-10-CM

## 2017-05-11 LAB — BASIC METABOLIC PANEL
BUN: 19 mg/dL (ref 6–23)
CO2: 27 mEq/L (ref 19–32)
Calcium: 9.1 mg/dL (ref 8.4–10.5)
Chloride: 106 mEq/L (ref 96–112)
Creatinine, Ser: 0.82 mg/dL (ref 0.40–1.20)
GFR: 70.03 mL/min (ref >60.00)
Glucose, Bld: 97 mg/dL (ref 70–99)
Potassium: 4.6 mEq/L (ref 3.5–5.1)
Sodium: 137 mEq/L (ref 135–145)

## 2017-05-11 MED ORDER — FUROSEMIDE 40 MG PO TABS: 40.0000 mg | ORAL_TABLET | Freq: Two times a day (BID) | ORAL | 0 refills | Status: DC

## 2017-05-11 NOTE — Patient Instructions (Signed)
WE NOW OFFER   [facility] FAST TRACK!!!  SAME DAY Appointments for ACUTE CARE  Such as: Sprains, Injuries, cuts, abrasions, rashes, muscle pain, joint pain, back pain Colds, flu, sore throats, headache, allergies, cough, fever  Ear pain, sinus and eye infections Abdominal pain, nausea, vomiting, diarrhea, upset stomach Animal/insect bites  3 Easy Ways to Schedule: Walk-In Scheduling Call in scheduling Mychart Sign-up: [email or web address]         

## 2017-05-11 NOTE — Addendum Note (Signed)
Addended by: Alysia Penna A on: 05/11/2017 04:51 PM   Modules accepted: Orders

## 2017-05-11 NOTE — Telephone Encounter (Signed)
I spoke with Clair Gull[facility] at [facility] and went over below information, also faxed a copy of medication list to [phone].

## 2017-05-11 NOTE — Progress Notes (Signed)
   Subjective:    Patient ID: Bianca Matthews, female    DOB: 31-Oct-1927, 84 y.o.   MRN: [id number]  HPI Here for a transitional follow up of a hospital stay from 05-05-17 to 05-08-17 for acute renal failure. She presented with confusion, hallucinations, and falling and was brought in by EMS. Her creatinine was as high as 1.97 but this had decreased to 0.89 by her DC day. This was felt to be from a combination of medications like Losartan, Spironolactone, Lasix, and Diclofenac, along with some dehydration. All these meds were stopped. After she was rehydrated with IV lfuids she felt better and her mental status returned to her baseline. Now at home she feels fine except for severe swelling in both lower legs. She has a long hx of venous insufficiency and she had been taking Lasix for years. The legs are now painful from the swelling. She keeps them wrapped with compression stockings. No SOB.    Review of Systems  Constitutional: Negative.   Respiratory: Negative.   Cardiovascular: Positive for leg swelling. Negative for chest pain and palpitations.  Neurological: Negative.        Objective:   Physical Exam  Constitutional: She is oriented to person, place, and time. She appears well-developed and well-nourished. No distress.  Neck: No thyromegaly present.  Cardiovascular: Normal rate, regular rhythm, normal heart sounds and intact distal pulses.   Pulmonary/Chest: Effort normal and breath sounds normal. No respiratory distress. She has no wheezes. She has no rales.  Abdominal: Soft. Bowel sounds are normal. She exhibits no distension. There is no tenderness. There is no rebound and no guarding.  Musculoskeletal:  4+ edema in both legs below the knees   Lymphadenopathy:    She has no cervical adenopathy.  Neurological: She is alert and oriented to person, place, and time.          Assessment & Plan:  She has recovered from an acute bout of renal insufficiency. This resulted from side  effects of medications and dehydration. We agreed to stay off Losartan, Spironolactone, and Diclofenac. She will start back on Lasix 40 mg BID. She will also get back on potassium chloride 20 mEq daily. Check a BMET today. Wear the compression stockings. Recheck in 2 weeks. Alysia Penna, MD

## 2017-05-11 NOTE — Telephone Encounter (Signed)
PT eval today preformed, recommend additional visit  2wk/ 4 Requesting updated medication list  Pt has +2 lower extremity edema  Pt states no change from when she was in the office today.  Please call Clair Gulling at [facility] with verbal for all.

## 2017-05-11 NOTE — Telephone Encounter (Signed)
Please OK any additional therapy requests. She started back on Lasix today for he ankle edema but it will take until next week to see any results

## 2017-05-14 ENCOUNTER — Telehealth: Payer: Self-pay | Admitting: Family Medicine

## 2017-05-14 MED ORDER — POTASSIUM CHLORIDE ER 10 MEQ PO TBCR: 10.0000 meq | EXTENDED_RELEASE_TABLET | Freq: Two times a day (BID) | ORAL | 3 refills | Status: DC

## 2017-05-14 NOTE — Telephone Encounter (Signed)
I spoke with pt and sent new script e-scribe to [facility].

## 2017-05-14 NOTE — Telephone Encounter (Signed)
I spoke pt and went over recent lab results. She said that you were going to start her on potassium?

## 2017-05-14 NOTE — Telephone Encounter (Signed)
To discuss personal matter.

## 2017-05-14 NOTE — Telephone Encounter (Signed)
I spoke with pt and went over recent lab results.

## 2017-05-14 NOTE — Telephone Encounter (Signed)
Correct. Call in Klor-con 10 mEq to take BID, #180 with 3 rf

## 2017-05-24 ENCOUNTER — Telehealth: Payer: Self-pay

## 2017-05-24 NOTE — Telephone Encounter (Signed)
Patty @ [facility] had home visit with pt today. Pt reports pain to Right elbow. Patty observed area is warm and red, minimally swollen.   Pt is scheduled to see you in the morning.   Dr. Sarajane Jews - FYI. Thanks!

## 2017-05-25 ENCOUNTER — Ambulatory Visit (INDEPENDENT_AMBULATORY_CARE_PROVIDER_SITE_OTHER): Payer: Medicare PPO | Admitting: Family Medicine

## 2017-05-25 ENCOUNTER — Encounter: Payer: Self-pay | Admitting: Family Medicine

## 2017-05-25 VITALS — BP 116/70 | HR 61 | Ht 64.0 in | Wt 173.0 lb

## 2017-05-25 DIAGNOSIS — I10 Essential (primary) hypertension: Secondary | ICD-10-CM

## 2017-05-25 DIAGNOSIS — E876 Hypokalemia: Secondary | ICD-10-CM

## 2017-05-25 DIAGNOSIS — Z23 Encounter for immunization: Secondary | ICD-10-CM

## 2017-05-25 DIAGNOSIS — R609 Edema, unspecified: Secondary | ICD-10-CM

## 2017-05-25 DIAGNOSIS — N179 Acute kidney failure, unspecified: Secondary | ICD-10-CM | POA: Diagnosis not present

## 2017-05-25 NOTE — Progress Notes (Signed)
   Subjective:    Patient ID: Bianca Matthews, female    DOB: 1927/11/26, 82 y.o.   MRN: [id number]  HPI Here to follow up on a bout of acute renal failure 3 weeks ago and then for ankle edema 2 weeks ago. On her last visit here on 05-11-17 her renal function had returned to normal., her GFR was 70 and her creatinine was 0.82. She is off all NSAID medications now. We started her back on Lasix 40 mg bid and today her weight is down 4 lbs from that day. She feels great and the swelling is down a bit.    Review of Systems  Constitutional: Negative.   Respiratory: Negative.   Cardiovascular: Positive for leg swelling. Negative for chest pain and palpitations.  Neurological: Negative.        Objective:   Physical Exam  Constitutional: She is oriented to person, place, and time. She appears well-developed and well-nourished.  Neck: No thyromegaly present.  Cardiovascular: Normal rate, regular rhythm, normal heart sounds and intact distal pulses.   Pulmonary/Chest: Effort normal and breath sounds normal. No respiratory distress. She has no wheezes. She has no rales.  Musculoskeletal:  3+ edema in both lower legs. She is wearing compression stockings.   Lymphadenopathy:    She has no cervical adenopathy.  Neurological: She is alert and oriented to person, place, and time.          Assessment & Plan:  She has recovered from a bout of acute renal failure. The leg edema is responding to Lasix. Her HTN is stable. She will stay on the current regimen. Given vaccines for flu and pneumonia. Recheck in 4 weeks.  Alysia Penna, MD

## 2017-05-25 NOTE — Addendum Note (Signed)
Addended by: Aggie Hacker A on: 05/25/2017 10:49 AM   Modules accepted: Orders

## 2017-05-25 NOTE — Patient Instructions (Signed)
WE NOW OFFER   [facility] FAST TRACK!!!  SAME DAY Appointments for ACUTE CARE  Such as: Sprains, Injuries, cuts, abrasions, rashes, muscle pain, joint pain, back pain Colds, flu, sore throats, headache, allergies, cough, fever  Ear pain, sinus and eye infections Abdominal pain, nausea, vomiting, diarrhea, upset stomach Animal/insect bites  3 Easy Ways to Schedule: Walk-In Scheduling Call in scheduling Mychart Sign-up: [email or web address]         

## 2017-06-01 ENCOUNTER — Telehealth: Payer: Self-pay

## 2017-06-01 NOTE — Telephone Encounter (Signed)
noted 

## 2017-06-01 NOTE — Telephone Encounter (Signed)
Pt's son, [name], called to report that pt has possibly been experiencing sundowner's. She has been having issues with mood swings and using foul language. He is concerned because this behavior is not normal for her. He does not think she has any issues such as UTI, but he will look for any s/s. Recommended he keep an activity log to note when pt has these abnormal moods to see if they would correlate with possible sundowner's. Also advised him to contact office if things take a drastic turn or if he starts to notice any s/s consistent with UTI or other physical problem. He voiced understanding.   Dr. Sarajane Jews - FYI. Thanks!

## 2017-06-06 ENCOUNTER — Encounter: Payer: Self-pay | Admitting: Podiatry

## 2017-06-06 ENCOUNTER — Ambulatory Visit (INDEPENDENT_AMBULATORY_CARE_PROVIDER_SITE_OTHER): Payer: Medicare PPO | Admitting: Podiatry

## 2017-06-06 DIAGNOSIS — B351 Tinea unguium: Secondary | ICD-10-CM

## 2017-06-06 DIAGNOSIS — M79676 Pain in unspecified toe(s): Secondary | ICD-10-CM

## 2017-06-06 NOTE — Progress Notes (Signed)
Patient ID: Bianca Matthews, female   DOB: 1928/01/02, 83 y.o.   MRN: [id number] Complaint:  Visit Type: Patient returns to my office for continued preventative foot care services. Complaint: Patient states" my nails have grown long and thick and become painful to walk and wear shoes" . The patient presents for preventative foot care services. No changes to ROS  Podiatric Exam: Vascular: dorsalis pedis and posterior tibial pulses are palpable bilateral. Capillary return is immediate. Temperature gradient is WNL. Skin turgor WNL  Sensorium: Normal Semmes Weinstein monofilament test. Normal tactile sensation bilaterally. Nail Exam: Pt has thick disfigured discolored nails with subungual debris noted bilateral entire nail hallux  Ulcer Exam: There is no evidence of ulcer or pre-ulcerative changes or infection. Orthopedic Exam: Muscle tone and strength are WNL. No limitations in general ROM. No crepitus or effusions noted. Foot type and digits show no abnormalities. Bony prominences are unremarkable. Skin: No Porokeratosis. No infection or ulcers  Diagnosis:  Onychomycosis, , Pain in right toe, pain in left toes  Treatment & Plan Procedures and Treatment: Consent by patient was obtained for treatment procedures. The patient understood the discussion of treatment and procedures well. All questions were answered thoroughly reviewed. Debridement of mycotic and hypertrophic toenails, 1 through 5 bilateral and clearing of subungual debris. No ulceration, no infection noted.  Return Visit-Office Procedure: Patient instructed to return to the office for a follow up visit 10 weeks for continued evaluation and treatment.  Gardiner Barefoot DPM

## 2017-06-11 ENCOUNTER — Ambulatory Visit (INDEPENDENT_AMBULATORY_CARE_PROVIDER_SITE_OTHER): Payer: Medicare PPO | Admitting: Family Medicine

## 2017-06-11 ENCOUNTER — Encounter: Payer: Self-pay | Admitting: Family Medicine

## 2017-06-11 VITALS — BP 152/77 | HR 71 | Temp 97.7°F | Ht 64.0 in | Wt 169.0 lb

## 2017-06-11 DIAGNOSIS — R609 Edema, unspecified: Secondary | ICD-10-CM

## 2017-06-11 DIAGNOSIS — I5032 Chronic diastolic (congestive) heart failure: Secondary | ICD-10-CM

## 2017-06-11 DIAGNOSIS — J9611 Chronic respiratory failure with hypoxia: Secondary | ICD-10-CM

## 2017-06-11 DIAGNOSIS — I10 Essential (primary) hypertension: Secondary | ICD-10-CM | POA: Diagnosis not present

## 2017-06-11 DIAGNOSIS — J9612 Chronic respiratory failure with hypercapnia: Secondary | ICD-10-CM

## 2017-06-11 DIAGNOSIS — F411 Generalized anxiety disorder: Secondary | ICD-10-CM | POA: Diagnosis not present

## 2017-06-11 NOTE — Patient Instructions (Signed)
WE NOW OFFER   [facility] FAST TRACK!!!  SAME DAY Appointments for ACUTE CARE  Such as: Sprains, Injuries, cuts, abrasions, rashes, muscle pain, joint pain, back pain Colds, flu, sore throats, headache, allergies, cough, fever  Ear pain, sinus and eye infections Abdominal pain, nausea, vomiting, diarrhea, upset stomach Animal/insect bites  3 Easy Ways to Schedule: Walk-In Scheduling Call in scheduling Mychart Sign-up: [email or web address]         

## 2017-06-11 NOTE — Progress Notes (Signed)
   Subjective:    Patient ID: Bianca Matthews, female    DOB: 1928/02/23, 83 y.o.   MRN: [id number]  HPI Here to follow up. She feels great and has no complaints. She brings a form from the [facility] to certify an eye exam. She has been taking Lasix 40 mg bid and this has worked well. She denies any SOB, and he leg swelling has been well controlled. Her weight is down 4 lbs from her last visit a few weeks ago. She recently saw Dr. [name] and her allergies have been well controlled. In fact he released her and she only needs to go back if needed.    Review of Systems  Constitutional: Negative.   Respiratory: Negative.   Cardiovascular: Positive for leg swelling. Negative for chest pain and palpitations.  Neurological: Negative.        Objective:   Physical Exam  Constitutional: She is oriented to person, place, and time. She appears well-developed and well-nourished.  Walks with a tripod cane   Neck: No thyromegaly present.  Cardiovascular: Normal rate, regular rhythm and intact distal pulses.   2/6 SM as usual   Pulmonary/Chest: Effort normal and breath sounds normal. No respiratory distress. She has no wheezes. She has no rales.  Musculoskeletal:  1+ edema in both lower legs, she is wearing compression stockings.  Lymphadenopathy:    She has no cervical adenopathy.  Neurological: She is alert and oriented to person, place, and time.          Assessment & Plan:  She is doing well. Her CHF is well controlled. Her HTN is stable at home. Her allergies have diminished over time. She had a full eye exam with Bianca Matthews OD last December, so I asked her to get with Dr. Laban Matthews about filling out the [facility] form. We will see Bianca Matthews again in 2 months.  Bianca Penna, MD

## 2017-06-25 ENCOUNTER — Other Ambulatory Visit: Payer: Self-pay | Admitting: Family Medicine

## 2017-06-29 ENCOUNTER — Telehealth: Payer: Self-pay | Admitting: Family Medicine

## 2017-06-29 NOTE — Telephone Encounter (Signed)
Bianca Matthews from [facility] called, wanted to clarify over the counter ASA, Motrin, & tylenol advice. Pt has recently started Motrin 200 mg bid for back pain, has stopped the Tylenol and aspirin. Per Dr. Sarajane Jews okay to continue this, just need to let office know when she stops the Motrin.

## 2017-07-18 ENCOUNTER — Ambulatory Visit (INDEPENDENT_AMBULATORY_CARE_PROVIDER_SITE_OTHER): Payer: Medicare PPO | Admitting: Family Medicine

## 2017-07-18 ENCOUNTER — Encounter: Payer: Self-pay | Admitting: Family Medicine

## 2017-07-18 VITALS — BP 153/77 | HR 67 | Temp 97.8°F | Ht 64.0 in | Wt 166.0 lb

## 2017-07-18 DIAGNOSIS — M545 Low back pain, unspecified: Secondary | ICD-10-CM

## 2017-07-18 MED ORDER — MELOXICAM 15 MG PO TABS
15.0000 mg | ORAL_TABLET | Freq: Every day | ORAL | 3 refills | Status: DC
Start: 2017-07-18 — End: 2017-09-26

## 2017-07-18 NOTE — Patient Instructions (Signed)
WE NOW OFFER   [facility] FAST TRACK!!!  SAME DAY Appointments for ACUTE CARE  Such as: Sprains, Injuries, cuts, abrasions, rashes, muscle pain, joint pain, back pain Colds, flu, sore throats, headache, allergies, cough, fever  Ear pain, sinus and eye infections Abdominal pain, nausea, vomiting, diarrhea, upset stomach Animal/insect bites  3 Easy Ways to Schedule: Walk-In Scheduling Call in scheduling Mychart Sign-up: [email or web address]         

## 2017-07-18 NOTE — Progress Notes (Signed)
   Subjective:    Patient ID: Bianca Matthews, female    DOB: [birth year], 83 y.o.   MRN: [id number]  HPI Here for one month of pain in the lower back with stiffness. She also has pain in multiple other joints like shoulders, hips, and knees. She gets a little relief with Motrin. No radiation of pain to the legs. No numbness or weakness in the legs.    Review of Systems  Constitutional: Negative.   Respiratory: Negative.   Cardiovascular: Negative.   Musculoskeletal: Positive for arthralgias and back pain.       Objective:   Physical Exam  Constitutional: She appears well-developed and well-nourished.  Walks with a cane   Cardiovascular: Normal rate, regular rhythm, normal heart sounds and intact distal pulses.   Pulmonary/Chest: Effort normal and breath sounds normal. No respiratory distress. She has no wheezes. She has no rales.  Musculoskeletal:  Tender in the lower back with reduced ROM. Negative SLR.           Assessment & Plan:  Osteoarthritis in multiple joints, including the lumbar spine. Try Meloxicam 15 mg daily.  Alysia Penna, MD

## 2017-07-23 ENCOUNTER — Other Ambulatory Visit: Payer: Self-pay | Admitting: Family Medicine

## 2017-07-27 ENCOUNTER — Telehealth: Payer: Self-pay | Admitting: Family Medicine

## 2017-07-27 NOTE — Telephone Encounter (Signed)
Pt needs refill on potassium 20 meq #90 w/refilss

## 2017-07-30 NOTE — Telephone Encounter (Signed)
Script was sent to [facility] for a 90 day supply in September 2018 with refills. I spoke with Winfred Burn and he is going to check with pharmacy.

## 2017-08-01 ENCOUNTER — Encounter (INDEPENDENT_AMBULATORY_CARE_PROVIDER_SITE_OTHER): Payer: Self-pay | Admitting: Physical Medicine and Rehabilitation

## 2017-08-01 ENCOUNTER — Ambulatory Visit (INDEPENDENT_AMBULATORY_CARE_PROVIDER_SITE_OTHER): Payer: Medicare PPO

## 2017-08-01 ENCOUNTER — Ambulatory Visit (INDEPENDENT_AMBULATORY_CARE_PROVIDER_SITE_OTHER): Payer: Medicare PPO | Admitting: Physical Medicine and Rehabilitation

## 2017-08-01 ENCOUNTER — Telehealth (INDEPENDENT_AMBULATORY_CARE_PROVIDER_SITE_OTHER): Payer: Self-pay | Admitting: Physical Medicine and Rehabilitation

## 2017-08-01 VITALS — BP 151/70 | HR 65

## 2017-08-01 DIAGNOSIS — I872 Venous insufficiency (chronic) (peripheral): Secondary | ICD-10-CM | POA: Diagnosis not present

## 2017-08-01 DIAGNOSIS — R600 Localized edema: Secondary | ICD-10-CM

## 2017-08-01 DIAGNOSIS — M79604 Pain in right leg: Secondary | ICD-10-CM | POA: Diagnosis not present

## 2017-08-01 DIAGNOSIS — M545 Low back pain, unspecified: Secondary | ICD-10-CM

## 2017-08-01 DIAGNOSIS — G8929 Other chronic pain: Secondary | ICD-10-CM | POA: Diagnosis not present

## 2017-08-01 NOTE — Progress Notes (Deleted)
Pain across lower back. Not worse on either side. Has swelling and pain in right leg- is going to have sclerotherapy for vericose veins.

## 2017-08-02 ENCOUNTER — Encounter (INDEPENDENT_AMBULATORY_CARE_PROVIDER_SITE_OTHER): Payer: Self-pay | Admitting: Physical Medicine and Rehabilitation

## 2017-08-02 NOTE — Progress Notes (Signed)
Bianca Matthews - 83 y.o. female MRN [id number]  Date of birth: 10-31-1927  Office Visit Note: Visit Date: 08/01/2017 PCP: Laurey Morale, MD Referred by: Laurey Morale, MD  Subjective: Chief Complaint  Patient presents with  . Lower Back - Pain   HPI: Bianca Matthews is an 83 year old who comes in today as a new patient for chronic back and hip and leg pain.  She is present with her son who provides a lot of the history.  She has had a history of chronic back pain for a number of years.  She reports that her biggest 2 problems is low back pain centered at the lumbosacral junction.  This is worse with standing and going from sit to stand.  It does radiate to the sides and is a burning quality of pain.  She gets some relief at rest or sitting.  She also has a secondary problem which is pain in the more anterior lateral thigh is a more of an L4 distribution.  No real paresthesias but worse with walking.  She feels better when she uses a grocery cart to lean forward on.  She denies any numbness tingling or paresthesias.  She also has sort of a third issue which is right hip and leg pain with more edema.  She is seeing a vascular specialist and it sounds like they have done some vein procedures may be on the left and will start doing the right soon.  If she has a history of syncope and heart failure.  She has had 2 falls over the last year that were pretty significant.  1 she sustained a skull fracture.  She has not had any hip replacements or spine surgery.  She has not noted any focal weakness although she feels weak in general.  Medications sedate have not helped.  She is not really tolerated most medications.  She has been taking meloxicam.  We did obtain plain film x-rays of the lumbar spine and this is reviewed below.  She did bring with her x-rays from a chiropractor but these were dated 2015.    Review of Systems  Constitutional: Positive for malaise/fatigue. Negative for chills, fever and weight  loss.  HENT: Negative for hearing loss and sinus pain.   Eyes: Negative for blurred vision, double vision and photophobia.  Respiratory: Negative for cough and shortness of breath.   Cardiovascular: Positive for leg swelling. Negative for chest pain and palpitations.  Gastrointestinal: Negative for abdominal pain, nausea and vomiting.  Genitourinary: Negative for flank pain.  Musculoskeletal: Positive for back pain, falls and joint pain. Negative for myalgias.  Skin: Negative for itching and rash.  Neurological: Negative for tremors, focal weakness and weakness.  Endo/Heme/Allergies: Negative.   Psychiatric/Behavioral: Negative for depression.  All other systems reviewed and are negative.  Otherwise per HPI.  Assessment & Plan: Visit Diagnoses:  1. Chronic midline low back pain without sciatica   2. Pain in right leg   3. Edema of right lower leg due to peripheral venous insufficiency     Plan: Findings:  Chronic worsening low back pain likely facet mediated low back pain.  Lumbar x-rays reveal listhesis of facet arthritis particularly at the lower lumbar levels.  She has some translational scoliosis.  She really has failed care over the years with chiropractic care and activity modification.  She is now having issues with falls and perhaps syncope due to vascular issues.  She continues to see a vascular specialist for her legs.  I think the best approach is bilateral facet joint blocks likely at the L4-5 and L5-S1 levels.  If this helped her greatly and we could watch her and see how she does overall.  If it helped but did not help the claudication type symptoms into the thighs we probably would look at an MRI of the lumbar spine at that point.  Obviously if it did not help much at all we would still consider the MRI at that point to see if there is significant stenosis.  Depending on the MRI findings we would probably look at epidural injection.  There is adjunctive medication for nerve type  pain that could be utilized as well.  We talked about staying active and trying to reduce falls.  She would benefit from skilled physical therapy as well.    Meds & Orders: No orders of the defined types were placed in this encounter.   Orders Placed This Encounter  Procedures  . XR Lumbar Spine 2-3 Views    Follow-up: Return for Bilateral L4-5 and L5-S1 facet joint block.   Procedures: No procedures performed  No notes on file   Clinical History: No specialty comments available.  She reports that she has quit smoking. She has a 5.00 pack-year smoking history. She has never used smokeless tobacco.   Recent Labs  10/09/16 1124  HGBA1C 5.8*    Objective:  VS:  HT:    WT:   BMI:     BP:(!) 151/70  HR:65bpm  TEMP: ( )  RESP:  Physical Exam  Constitutional: She is oriented to person, place, and time. She appears well-developed and well-nourished. No distress.  Obese  HENT:  Head: Normocephalic and atraumatic.  Nose: Nose normal.  Mouth/Throat: Oropharynx is clear and moist.  Eyes: Pupils are equal, round, and reactive to light. Conjunctivae are normal.  Neck: Normal range of motion. Neck supple.  Cardiovascular: Regular rhythm and intact distal pulses.   Pulmonary/Chest: Effort normal. No respiratory distress.  Abdominal: She exhibits no distension. There is no guarding.  Musculoskeletal:  Patient ambulates very slowly with a cane.  She is very slow to arise from a seated position.  She has mild paraspinal tenderness with tight musculature.  She has no pain over the greater trochanters.  She has no pain with hip rotation.  She does have pain with extension of the lumbar spine.  She has pitting edema right more than the left but bilaterally lower feet.  She has no clonus bilaterally.  She has good 5 out of 5 strength in all muscle groups of the lower extremities bilaterally.  She is poor effort with hip flexion.  Neurological: She is alert and oriented to person, place, and  time. She exhibits normal muscle tone. Coordination normal.  Skin: Skin is warm. No rash noted. No erythema.  Psychiatric: She has a normal mood and affect. Her behavior is normal.  Nursing note and vitals reviewed.   Ortho Exam Imaging: Xr Lumbar Spine 2-3 Views  Result Date: 08/01/2017 AP and lateral lumbar spine shows rightward translational scoliosis thoracolumbar.  There is diffuse degenerative change.  There is a grade 1 listhesis of L4 on L5 if you consider the L5 transitional.  It does appear that there is sacralization of the L5 vertebral body.  The sacroiliac joints look well maintained.  Both hips appear to only have very mild degenerative change.  No fractures or dislocations.   Past Medical/Family/Surgical/Social History: Medications & Allergies reviewed per EMR Patient Active Problem  List   Diagnosis Date Noted  . AKI (acute kidney injury) ([place]) 05/05/2017  . Hyponatremia 05/05/2017  . Chronic respiratory failure with hypoxia and hypercapnia ([place]) 11/04/2016  . Morbid obesity due to excess calories ([place]) 11/04/2016  . Syncope 10/27/2016  . Skull fracture with concussion ([name]) 10/08/2016  . Diastolic CHF, chronic ([place]) 10/08/2016  . Hypokalemia 10/08/2016  . Hyperlipidemia 12/02/2015  . Hyperglycemia 02/24/2015  . Other and unspecified hyperlipidemia 06/16/2014  . Low back pain 06/16/2014  . Anxiety state 01/30/2014  . Rash 03/14/2011  . ANKLE PAIN 08/31/2010  . Arthropathy, multiple sites 06/27/2010  . Allergic rhinitis 05/06/2008  . DEPENDENT EDEMA 01/29/2008  . Essential hypertension 06/11/2007  . GERD 06/11/2007   Past Medical History:  Diagnosis Date  . Allergy    sees Dr. [name]  . GERD (gastroesophageal reflux disease)   . Hyperlipidemia   . Hypertension   . Varicose veins    Family History  Problem Relation Age of Onset  . Hypertension Unknown   . Heart disease Unknown    Past Surgical History:  Procedure Laterality Date  . APPENDECTOMY    .  TONSILLECTOMY     Social History   Occupational History  . retired    Social History Main Topics  . Smoking status: Former Smoker    Packs/day: 0.50    Years: 10.00  . Smokeless tobacco: Never Used  . Alcohol use No  . Drug use: No  . Sexual activity: Not on file

## 2017-08-03 NOTE — Telephone Encounter (Signed)
Just facet. Sorry, must have been looking at the wrong code.

## 2017-08-03 NOTE — Telephone Encounter (Signed)
Does this pt need auth for the RFA ([id number]) or just facet/MBB ([id number])? Wanted to verify before I submitted for auth.

## 2017-08-03 NOTE — Telephone Encounter (Signed)
Submitted auth request on [facility] website for code [id number] and attached Dr. Romona Curls office note

## 2017-08-07 ENCOUNTER — Telehealth: Payer: Self-pay | Admitting: Family Medicine

## 2017-08-07 NOTE — Telephone Encounter (Signed)
Called pt and unable to leave a VM. Need to find out if pt is aware that they have refills at [facility] and if they want the Rx's at [facility] stopped so we can send in an Rx to [facility].

## 2017-08-07 NOTE — Telephone Encounter (Signed)
Scheduled for 11/19 at 0915. I told the patient's son that we would call him to get her in sooner if we get auth before then.

## 2017-08-07 NOTE — Telephone Encounter (Signed)
Pt need new Rx for potassium chloride 10 MEQ  Pharm:  [facility]

## 2017-08-07 NOTE — Telephone Encounter (Signed)
Rx was last refilled 05/14/2017 for 90 days with 3 refills. Sent to [facility] and [facility].

## 2017-08-09 NOTE — Telephone Encounter (Signed)
I spoke son [name], he will confirm with pt where she would prefer to get scripts from either mail order or [facility]?

## 2017-08-10 NOTE — Telephone Encounter (Signed)
Received auth by fax. Auth #[id number]/ eff 08/03/17-09/17/17. Pt has already been scheduled.

## 2017-08-10 NOTE — Telephone Encounter (Signed)
Pt still has refills at [facility], son will call us back if we need to change pharmacies.

## 2017-08-15 ENCOUNTER — Encounter: Payer: Self-pay | Admitting: Podiatry

## 2017-08-15 ENCOUNTER — Ambulatory Visit: Payer: Medicare PPO | Admitting: Podiatry

## 2017-08-15 DIAGNOSIS — B351 Tinea unguium: Secondary | ICD-10-CM

## 2017-08-15 DIAGNOSIS — M79676 Pain in unspecified toe(s): Secondary | ICD-10-CM | POA: Diagnosis not present

## 2017-08-15 NOTE — Progress Notes (Signed)
Patient ID: Bianca Matthews, female   DOB: [birth year], 83 y.o.   MRN: [id number] Complaint:  Visit Type: Patient returns to my office for continued preventative foot care services. Complaint: Patient states" my nails have grown long and thick and become painful to walk and wear shoes" . The patient presents for preventative foot care services. No changes to ROS  Podiatric Exam: Vascular: dorsalis pedis and posterior tibial pulses are palpable bilateral. Capillary return is immediate. Temperature gradient is WNL. Skin turgor WNL  Sensorium: Normal Semmes Weinstein monofilament test. Normal tactile sensation bilaterally. Nail Exam: Pt has thick disfigured discolored nails with subungual debris noted bilateral entire nail hallux  Ulcer Exam: There is no evidence of ulcer or pre-ulcerative changes or infection. Orthopedic Exam: Muscle tone and strength are WNL. No limitations in general ROM. No crepitus or effusions noted. Foot type and digits show no abnormalities. Bony prominences are unremarkable. Skin: No Porokeratosis. No infection or ulcers  Diagnosis:  Onychomycosis, , Pain in right toe, pain in left toes  Treatment & Plan Procedures and Treatment: Consent by patient was obtained for treatment procedures. The patient understood the discussion of treatment and procedures well. All questions were answered thoroughly reviewed. Debridement of mycotic and hypertrophic toenails, 1 through 5 bilateral and clearing of subungual debris. No ulceration, no infection noted.  Return Visit-Office Procedure: Patient instructed to return to the office for a follow up visit 10 weeks for continued evaluation and treatment.  Gardiner Barefoot DPM

## 2017-08-20 ENCOUNTER — Ambulatory Visit (INDEPENDENT_AMBULATORY_CARE_PROVIDER_SITE_OTHER): Payer: Medicare PPO | Admitting: Physical Medicine and Rehabilitation

## 2017-08-20 ENCOUNTER — Encounter (INDEPENDENT_AMBULATORY_CARE_PROVIDER_SITE_OTHER): Payer: Self-pay | Admitting: Physical Medicine and Rehabilitation

## 2017-08-20 ENCOUNTER — Ambulatory Visit (INDEPENDENT_AMBULATORY_CARE_PROVIDER_SITE_OTHER): Payer: Medicare PPO

## 2017-08-20 VITALS — BP 156/69 | HR 64 | Temp 97.8°F

## 2017-08-20 DIAGNOSIS — G8929 Other chronic pain: Secondary | ICD-10-CM

## 2017-08-20 DIAGNOSIS — M47816 Spondylosis without myelopathy or radiculopathy, lumbar region: Secondary | ICD-10-CM

## 2017-08-20 DIAGNOSIS — M545 Low back pain: Secondary | ICD-10-CM

## 2017-08-20 MED ORDER — LIDOCAINE HCL (PF) 1 % IJ SOLN: 2.0000 mL | Freq: Once | INTRAMUSCULAR | Status: DC

## 2017-08-20 MED ORDER — METHYLPREDNISOLONE ACETATE 80 MG/ML IJ SUSP
80.0000 mg | Freq: Once | INTRAMUSCULAR | Status: AC
  Administered 2017-09-04: 80 mg

## 2017-08-20 NOTE — Patient Instructions (Signed)

## 2017-08-20 NOTE — Progress Notes (Deleted)
Lower back pain, right side pain. Radiating pain from right foot to right hip. Walking increases pain, uses cane.  No contrast allergy. Has driver.

## 2017-08-20 NOTE — Procedures (Signed)
[name] is an 83 year old female who is accompanied by her son who provides a lot of the history.  We saw her evaluation recently and decided to complete diagnostic and hopefully therapeutic bilateral L4-5 facet joint blocks.  Please see our prior evaluation and management note for further details and justification.  Lumbar Facet Joint Intra-Articular Injection(s) with Fluoroscopic Guidance  Patient: Bianca Matthews      Date of Birth: 1928/05/10 MRN: [id number] PCP: Laurey Morale, MD      Visit Date: 08/20/2017   Universal Protocol:    Date/Time: 08/20/2017  Consent Given By: the patient  Position: PRONE   Additional Comments: Vital signs were monitored before and after the procedure. Patient was prepped and draped in the usual sterile fashion. The correct patient, procedure, and site was verified.   Injection Procedure Details:  Procedure Site One Meds Administered:  Meds ordered this encounter  Medications  . lidocaine (PF) (XYLOCAINE) 1 % injection 2 mL  . methylPREDNISolone acetate (DEPO-MEDROL) injection 80 mg     Laterality: Bilateral  Location/Site:  L4-L5  Needle size: 22 guage  Needle type: Spinal  Needle Placement: Articular  Findings:  -Contrast Used: 1 mL iohexol 180 mg iodine/mL   -Comments: Excellent flow of contrast producing a partial arthrogram.  Procedure Details: The fluoroscope beam is vertically oriented in AP, and the inferior recess is visualized beneath the lower pole of the inferior apophyseal process, which represents the target point for needle insertion. When direct visualization is difficult the target point is located at the medial projection of the vertebral pedicle. The region overlying each aforementioned target is locally anesthetized with a 1 to 2 ml. volume of 1% Lidocaine without Epinephrine.   The spinal needle was inserted into each of the above mentioned facet joints using biplanar fluoroscopic guidance. A 0.25 to 0.5 ml.  volume of Isovue-250 was injected and a partial facet joint arthrogram was obtained. A single spot film was obtained of the resulting arthrogram.    One to 1.25 ml of the steroid/anesthetic solution was then injected into each of the facet joints noted above.   Additional Comments:  The patient tolerated the procedure well Dressing: Band-Aid    Post-procedure details: Patient was observed during the procedure. Post-procedure instructions were reviewed.  Patient left the clinic in stable condition.

## 2017-08-28 ENCOUNTER — Telehealth (INDEPENDENT_AMBULATORY_CARE_PROVIDER_SITE_OTHER): Payer: Self-pay | Admitting: Physical Medicine and Rehabilitation

## 2017-08-28 DIAGNOSIS — M47816 Spondylosis without myelopathy or radiculopathy, lumbar region: Secondary | ICD-10-CM

## 2017-08-28 NOTE — Telephone Encounter (Signed)
Can you order lspine MRI, open MRI ok if cluastrophobic

## 2017-08-28 NOTE — Telephone Encounter (Signed)
Patient called and left a message saying she needed to schedule a follow up appointment with Dr. Ernestina Patches. She had L4-L5 facet injections 08/20/17. I called her back to get more information, and she did not answer and her voicemail has not ben set up.

## 2017-08-28 NOTE — Telephone Encounter (Signed)
Tried to call patient to discuss. No answer and no voicemail option.

## 2017-08-29 NOTE — Telephone Encounter (Signed)
Order placed

## 2017-09-07 ENCOUNTER — Ambulatory Visit: Payer: Medicare PPO | Admitting: Podiatry

## 2017-09-13 ENCOUNTER — Inpatient Hospital Stay: Admission: RE | Admit: 2017-09-13 | Payer: Medicare PPO | Source: Ambulatory Visit

## 2017-09-18 ENCOUNTER — Telehealth (INDEPENDENT_AMBULATORY_CARE_PROVIDER_SITE_OTHER): Payer: Self-pay | Admitting: Physical Medicine and Rehabilitation

## 2017-09-18 DIAGNOSIS — M47816 Spondylosis without myelopathy or radiculopathy, lumbar region: Secondary | ICD-10-CM

## 2017-09-18 NOTE — Telephone Encounter (Signed)
Son would like to try CT. Let me know what to order and I will get it put in.

## 2017-09-18 NOTE — Telephone Encounter (Signed)
CT scan maybe? Of lumbar spine.

## 2017-09-19 NOTE — Telephone Encounter (Signed)
T-L spine CT, no contrast, thanks

## 2017-09-19 NOTE — Telephone Encounter (Signed)
Order placed, awaiting cosign.

## 2017-09-26 ENCOUNTER — Ambulatory Visit: Payer: Medicare PPO | Admitting: Family Medicine

## 2017-09-26 ENCOUNTER — Encounter: Payer: Self-pay | Admitting: Family Medicine

## 2017-09-26 ENCOUNTER — Telehealth (INDEPENDENT_AMBULATORY_CARE_PROVIDER_SITE_OTHER): Payer: Self-pay | Admitting: *Deleted

## 2017-09-26 VITALS — BP 110/62 | HR 70 | Temp 97.6°F | Wt 151.6 lb

## 2017-09-26 DIAGNOSIS — M545 Low back pain, unspecified: Secondary | ICD-10-CM

## 2017-09-26 DIAGNOSIS — G8929 Other chronic pain: Secondary | ICD-10-CM | POA: Diagnosis not present

## 2017-09-26 MED ORDER — HYDROCODONE-ACETAMINOPHEN 10-325 MG PO TABS: 1.0000 | ORAL_TABLET | Freq: Two times a day (BID) | ORAL | 0 refills | Status: DC | PRN

## 2017-09-26 NOTE — Progress Notes (Signed)
   Subjective:    Patient ID: Bianca Matthews, female    DOB: 06/23/1928, 83 y.o.   MRN: [id number]  HPI Here for follow up on low back pain. Her son is driving her. She has been seeing a chiropractor and using a TENS unit with mixed results. She is seeing Dr. Laurence Spates and she has received several facet joint injections with no relief. He wants to set up a CT scan of the lower spine. She tried meloxicam and got no relief at all. She is having trouble sleeping due to the pain.    Review of Systems  Constitutional: Negative.   Cardiovascular: Negative.   Musculoskeletal: Positive for back pain.       Objective:   Physical Exam  Constitutional: She is oriented to person, place, and time.  Walks with a cane.   Cardiovascular: Normal rate, regular rhythm, normal heart sounds and intact distal pulses.  Pulmonary/Chest: Effort normal and breath sounds normal. No respiratory distress. She has no wheezes. She has no rales.  Neurological: She is alert and oriented to person, place, and time.          Assessment & Plan:  Low back pain. She has known facet arthritis but may also have other issues involved. I agree with getting the CT scan . We will stop Meloxicam and try Norco bid as needed. This should help her get some sleep. Recheck prn.  Alysia Penna, MD

## 2017-09-26 NOTE — Telephone Encounter (Signed)
Pt is scheduled for CT on Dec 31 at 930am at [facility] [address] [facility] location, I C left message on son vm to return call for appt information.

## 2017-09-27 ENCOUNTER — Telehealth: Payer: Self-pay | Admitting: Family Medicine

## 2017-09-27 NOTE — Telephone Encounter (Signed)
Copied from [place] [id number]. Topic: Quick Communication - See Telephone Encounter >> Sep 27, 2017  9:05 AM Cleaster Corin, NT wrote: CRM for notification. See Telephone encounter for:   09/27/17. Pt. Son calling to request another rx. For hydrocodone-acetaminophen [facility] only gave the pt. 5 pills and she will be out as of tomorrow son will be able to come by office to pick up rx. Today   [place], [place] - [address] [place] [place] [address] Phone: [phone] Fax: [phone]

## 2017-09-27 NOTE — Telephone Encounter (Signed)
Based on note  Should only be using twice a dau  As add on  Dr fry out of office .   Can rx 10 pills   I can send in laster pending  If that is  Acceptable plan

## 2017-09-27 NOTE — Telephone Encounter (Signed)
Pt son aware of appt

## 2017-09-27 NOTE — Telephone Encounter (Signed)
Last OV 09/26/2017. Rx was last refilled 09/26/2017 disp 60 with no refills. Call pt's pharmacy. Called and spoke to the pharmacist and they stated they yes they did only give the pt 10 pills due to the opioid crisis. [facility] will restrict initial acute opioid prescriptions to no more than a seven-day supply, with up to a 50 morphine milligram equivalent maximum per day.response act. They can refill the remainder amount but will need another RX to be sent into pharmacy. Sent to Dr. Regis Bill

## 2017-09-28 ENCOUNTER — Telehealth (INDEPENDENT_AMBULATORY_CARE_PROVIDER_SITE_OTHER): Payer: Self-pay | Admitting: Radiology

## 2017-09-28 MED ORDER — HYDROCODONE-ACETAMINOPHEN 10-325 MG PO TABS: 1.0000 | ORAL_TABLET | Freq: Two times a day (BID) | ORAL | 0 refills | Status: DC | PRN

## 2017-09-28 NOTE — Telephone Encounter (Signed)
I called Dorian Pod and new authorization has been done.

## 2017-09-28 NOTE — Telephone Encounter (Signed)
Pt advised Rx was sent for the short term supply. Will hold this until Monday for Dr. Sarajane Jews to be aware and resend the remainder of the Rx.

## 2017-09-28 NOTE — Telephone Encounter (Signed)
Dorian Pod called from [facility].  Patient is scheduled for a CT Lumbar spine Monday at [facility] and the auth that is done is for CT Cervical spine.  [employer] is insurance, can you please do the correct auth? Thanks.

## 2017-09-28 NOTE — Telephone Encounter (Signed)
Called and spoke with pt. Pt is NOT willing to wait and would like the 10 pills to be sent in. Sent to Dr. Regis Bill due to PCP being out of the office

## 2017-10-01 ENCOUNTER — Ambulatory Visit
Admission: RE | Admit: 2017-10-01 | Discharge: 2017-10-01 | Disposition: A | Discharge status: Home / Self Care | Payer: Medicare PPO | Source: Ambulatory Visit | Attending: Physical Medicine and Rehabilitation | Admitting: Physical Medicine and Rehabilitation

## 2017-10-01 ENCOUNTER — Telehealth (INDEPENDENT_AMBULATORY_CARE_PROVIDER_SITE_OTHER): Payer: Self-pay | Admitting: *Deleted

## 2017-10-01 DIAGNOSIS — M47816 Spondylosis without myelopathy or radiculopathy, lumbar region: Secondary | ICD-10-CM

## 2017-10-01 MED ORDER — HYDROCODONE-ACETAMINOPHEN 10-325 MG PO TABS: 1.0000 | ORAL_TABLET | Freq: Two times a day (BID) | ORAL | 0 refills | Status: DC | PRN

## 2017-10-01 NOTE — Telephone Encounter (Signed)
Called and spoke with pt. Pt advised and voiced understanding.  

## 2017-10-01 NOTE — Telephone Encounter (Signed)
I sent in an additional 50 tabs

## 2017-10-01 NOTE — Telephone Encounter (Signed)
Received call from [facility] radiology with CT results they are as followed:    New sclerotic lesions throughout the visualized spine, ribs and bony pelvis worrisome for metastatic disease. Breast cancer would be the most likely explanation. Correlation with mammography recommended if not recently performed. Bone scan may be helpful to ascertain the extent of osseous metastatic disease.

## 2017-10-01 NOTE — Telephone Encounter (Signed)
Dr. Ernestina Patches aware.

## 2017-10-03 ENCOUNTER — Encounter (INDEPENDENT_AMBULATORY_CARE_PROVIDER_SITE_OTHER): Payer: Self-pay | Admitting: Physical Medicine and Rehabilitation

## 2017-10-03 ENCOUNTER — Ambulatory Visit (INDEPENDENT_AMBULATORY_CARE_PROVIDER_SITE_OTHER): Payer: Medicare PPO | Admitting: Physical Medicine and Rehabilitation

## 2017-10-03 VITALS — BP 142/86 | HR 66 | Temp 97.5°F

## 2017-10-03 DIAGNOSIS — M545 Low back pain, unspecified: Secondary | ICD-10-CM

## 2017-10-03 DIAGNOSIS — G8929 Other chronic pain: Secondary | ICD-10-CM | POA: Diagnosis not present

## 2017-10-03 DIAGNOSIS — M4316 Spondylolisthesis, lumbar region: Secondary | ICD-10-CM | POA: Diagnosis not present

## 2017-10-03 DIAGNOSIS — C7951 Secondary malignant neoplasm of bone: Secondary | ICD-10-CM | POA: Diagnosis not present

## 2017-10-03 DIAGNOSIS — M47816 Spondylosis without myelopathy or radiculopathy, lumbar region: Secondary | ICD-10-CM

## 2017-10-03 DIAGNOSIS — M79604 Pain in right leg: Secondary | ICD-10-CM

## 2017-10-03 MED ORDER — BACLOFEN 10 MG PO TABS: 10.0000 mg | ORAL_TABLET | Freq: Three times a day (TID) | ORAL | 0 refills | Status: DC | PRN

## 2017-10-03 MED ORDER — HYDROCODONE-ACETAMINOPHEN 5-325 MG PO TABS: 1.0000 | ORAL_TABLET | Freq: Four times a day (QID) | ORAL | 0 refills | Status: DC | PRN

## 2017-10-03 NOTE — Progress Notes (Deleted)
Pt states extremely lower back pain and occasional pain in both knees. On a scale of 1-10 pt stated a 10. Pt states she can barely walk and feels numbness in both thighs. Pt states pain began a couple of months ago. Pt can barely walk anything pt does pain gets worse, nothing helps to better pain. Pt states last shot did not help.

## 2017-10-03 NOTE — Progress Notes (Signed)
Bianca Matthews - 84 y.o. female MRN [id number]  Date of birth: 1927-11-05  Office Visit Note: Visit Date: 10/03/2017 PCP: Laurey Morale, MD Referred by: Laurey Morale, MD  Subjective: Chief Complaint  Patient presents with  . Lower Back - Pain  . Right Knee - Pain  . Left Knee - Pain   HPI: Bianca Matthews is an 84 year old female who is present with her son history.  I have seen the patient and her son on 2 occasions for chronic worsening axial low back pain we completed facet joint block which was really not very beneficial in terms of her pain and so we did order an MRI of the lumbar spine.  She had not had any advanced imaging in the past.  She was really unable to tolerate the MRI so we did order a CT scan she is here today for review of the CT scan and according to the patient and her son she is actually worsened over the last 2 weeks since I have seen her.  CT scan was just completed ago.  He rates her pain as a 10 out of 10 especially with ambulating and standing it is chronic constant pain.  Reports lower back pain point where sleep at night states that she really can barely walk because of the pain at this point.  She does not feel like any other medications or the shot that we perform his help.  She has been in contact with her primary care physician Dr. Sarajane Jews who prescribed hydrocodone 10 mg.  Patient's son states that she really cannot tolerate that as it makes her disoriented.  Does seem to help the pain to a degree twice a day.  Fortunately the CT scan shows likely metastatic spread to the bone.  The radiologist commented that it is likely has cancer.  Patient has not had any recent mammograms.  She does not report any specific recent weight loss.  She does get night time pain.  She has had no fevers chills or night sweats.  Significant mostly lower back spinal pain he has pain in multiple joint areas as well.  She also shows me today to small areas of hemorrhaging bruising in the fourth  and fifth digit on the left hand report any specific trauma.  She also has a secondary issue that she has had complaints of bilateral lower extremity swelling this is not worsened but is not got any better.  She has been seeing a vein specialist for procedures.  I asked me today just to look at her legs because they cannot seem to easily get the compression stockings in place.  I did message Dr. Sarajane Jews concerning the CT scan.    Review of Systems  Constitutional: Positive for malaise/fatigue. Negative for chills, fever and weight loss.  HENT: Negative for hearing loss and sinus pain.   Eyes: Negative for blurred vision, double vision and photophobia.  Respiratory: Negative for cough and shortness of breath.   Cardiovascular: Negative for chest pain, palpitations and leg swelling.  Gastrointestinal: Positive for constipation. Negative for abdominal pain, nausea and vomiting.  Genitourinary: Negative for flank pain.  Musculoskeletal: Positive for back pain, falls and joint pain. Negative for myalgias.  Skin: Negative for itching and rash.  Neurological: Positive for weakness. Negative for tremors and focal weakness.  Endo/Heme/Allergies: Bruises/bleeds easily.  Psychiatric/Behavioral: Negative for depression.  All other systems reviewed and are negative.  Otherwise per HPI.  Assessment & Plan: Visit Diagnoses:  1.  Spondylosis without myelopathy or radiculopathy, lumbar region   2. Chronic midline low back pain without sciatica   3. Pain in right leg   4. Spondylolisthesis of lumbar region   5. Metastatic cancer to bone [facility])     Plan: Findings:  Chronic history of long-term back and joint pain now with several month history of progressively worsening severe low back pain.  2 weeks of very progressive fatigue and back pain to the point that she cannot find any position of relief.  Facet joint block was not effective and CT scan shows what is likely metastatic breast cancer to the bone  throughout the lumbar spine.  The radiology report suggested nuclear medicine bone scan as well as correlation with mammogram.  Patient has not had recent mammogram.  I did speak with the patient's primary care physician Dr. Sarajane Jews agreed to send her to Dr. Burr Medico for evaluation and management and also order bone scan.  In terms of pain relief did seem to help to some degree but she could not tolerated very well and her very disoriented.  Told her son that they could try cutting that in half and see if she still got pain relief but may be that would not make her is disoriented.  A new prescription just in case to reflect the fact that she can use this up to 3 or 4 times per day depending on her tolerance.  She is chronically constipated and this will not make it any better.  I did tell them to add Colace to her regimen of MiraLAX.  Also added he is said muscle relaxers have helped her in the past.  Ultimately someone who would be a good candidate for spine XRT this could obviously be managed by Dr. Burr Medico and really out of my expertise at that point.  The medications were thoroughly discussed with her and her son who keeps track of her medications.  She should also make an appointment to see Dr. Sarajane Jews.  In terms of her edema she is followed by vascular specialist.  1 of our staff members try to help put the compression hose on her and measure her to be fitted.    Meds & Orders:  Meds ordered this encounter  Medications  . baclofen (LIORESAL) 10 MG tablet    Sig: Take 1 tablet (10 mg total) by mouth every 8 (eight) hours as needed for muscle spasms (Pain).    Dispense:  60 tablet    Refill:  0  . HYDROcodone-acetaminophen (NORCO/VICODIN) 5-325 MG tablet    Sig: Take 1 tablet by mouth every 6 (six) hours as needed for moderate pain.    Dispense:  60 tablet    Refill:  0    Chronic condition    Orders Placed This Encounter  Procedures  . NM Bone Scan Whole Body  . Ambulatory referral to Hematology / Oncology      Follow-up: Return for Dr. Burr Medico for evaluation and management.   Procedures: No procedures performed  No notes on file   Clinical History: No specialty comments available.  She reports that she has quit smoking. She has a 5.00 pack-year smoking history. she has never used smokeless tobacco.  Recent Labs    10/09/16 1124  HGBA1C 5.8*    Objective:  VS:  HT:    WT:   BMI:     BP:(!) 142/86  HR:66bpm  TEMP:(!) 97.5 F (36.4 C)(tried twice with oral and did not get a reading.)(Axillary)  RESP:97 % Physical Exam  Constitutional: She is oriented to person, place, and time. She appears well-developed and well-nourished. No distress.  HENT:  Head: Normocephalic and atraumatic.  Nose: Nose normal.  Mouth/Throat: Oropharynx is clear and moist.  Eyes: Conjunctivae are normal. Pupils are equal, round, and reactive to light.  Neck: Normal range of motion. Neck supple.  Cardiovascular: Normal rate, regular rhythm and intact distal pulses.  Pulmonary/Chest: Effort normal. No respiratory distress. She has no wheezes.  Abdominal: Soft. She exhibits no distension. There is no guarding.  Musculoskeletal: She exhibits edema.  There is slow to rise from a seated position.  She has pain with extension of the lumbar spine.  No pain with hip rotation strength.  She has significant edema which is 3+ pitting edema lower extremities bilaterally.  There is no venous stasis changes.  Left hand fourth and fifth digit there shows distal bruising or hemorrhaging.  The right hand on the dorsal surface has a bruise.  Lymphadenopathy:    She has no cervical adenopathy.  Neurological: She is alert and oriented to person, place, and time. She exhibits normal muscle tone. Coordination normal.  Skin: Skin is warm. No rash noted. No erythema.  Psychiatric: She has a normal mood and affect. Her behavior is normal.  Nursing note and vitals reviewed.   Ortho Exam Imaging: No results found.  Past  Medical/Family/Surgical/Social History: Medications & Allergies reviewed per EMR Patient Active Problem List   Diagnosis Date Noted  . Spondylosis without myelopathy or radiculopathy, lumbar region 08/20/2017  . AKI (acute kidney injury) ([place]) 05/05/2017  . Hyponatremia 05/05/2017  . Chronic respiratory failure with hypoxia and hypercapnia ([place]) 11/04/2016  . Morbid obesity due to excess calories ([place]) 11/04/2016  . Syncope 10/27/2016  . Skull fracture with concussion ([place]) 10/08/2016  . Diastolic CHF, chronic ([place]) 10/08/2016  . Hypokalemia 10/08/2016  . Hyperlipidemia 12/02/2015  . Hyperglycemia 02/24/2015  . Other and unspecified hyperlipidemia 06/16/2014  . Chronic midline low back pain without sciatica 06/16/2014  . Anxiety state 01/30/2014  . Rash 03/14/2011  . ANKLE PAIN 08/31/2010  . Arthropathy, multiple sites 06/27/2010  . Allergic rhinitis 05/06/2008  . DEPENDENT EDEMA 01/29/2008  . Essential hypertension 06/11/2007  . GERD 06/11/2007   Past Medical History:  Diagnosis Date  . Allergy    sees Dr. [name]  . GERD (gastroesophageal reflux disease)   . Hyperlipidemia   . Hypertension   . Varicose veins    Family History  Problem Relation Age of Onset  . Hypertension Unknown   . Heart disease Unknown    Past Surgical History:  Procedure Laterality Date  . APPENDECTOMY    . TONSILLECTOMY     Social History   Occupational History  . Occupation: retired  Tobacco Use  . Smoking status: Former Smoker    Packs/day: 0.50    Years: 10.00    Pack years: 5.00  . Smokeless tobacco: Never Used  Substance and Sexual Activity  . Alcohol use: No    Alcohol/week: 0.0 oz  . Drug use: No  . Sexual activity: Not on file

## 2017-10-04 ENCOUNTER — Other Ambulatory Visit (INDEPENDENT_AMBULATORY_CARE_PROVIDER_SITE_OTHER): Payer: Self-pay | Admitting: Physical Medicine and Rehabilitation

## 2017-10-04 DIAGNOSIS — Z1231 Encounter for screening mammogram for malignant neoplasm of breast: Secondary | ICD-10-CM

## 2017-10-04 DIAGNOSIS — R9389 Abnormal findings on diagnostic imaging of other specified body structures: Secondary | ICD-10-CM

## 2017-10-05 ENCOUNTER — Encounter: Payer: Self-pay | Admitting: Family Medicine

## 2017-10-05 ENCOUNTER — Ambulatory Visit: Payer: Medicare PPO | Admitting: Family Medicine

## 2017-10-05 VITALS — BP 98/60 | HR 66 | Temp 96.4°F | Wt 150.6 lb

## 2017-10-05 DIAGNOSIS — R531 Weakness: Secondary | ICD-10-CM

## 2017-10-05 DIAGNOSIS — G8929 Other chronic pain: Secondary | ICD-10-CM

## 2017-10-05 DIAGNOSIS — I7300 Raynaud's syndrome without gangrene: Secondary | ICD-10-CM

## 2017-10-05 DIAGNOSIS — M545 Low back pain: Secondary | ICD-10-CM

## 2017-10-05 DIAGNOSIS — R937 Abnormal findings on diagnostic imaging of other parts of musculoskeletal system: Secondary | ICD-10-CM | POA: Diagnosis not present

## 2017-10-05 LAB — BASIC METABOLIC PANEL
BUN: 22 mg/dL (ref 6–23)
CO2: 30 mEq/L (ref 19–32)
Calcium: 9.2 mg/dL (ref 8.4–10.5)
Chloride: 89 mEq/L — ABNORMAL LOW (ref 96–112)
Creatinine, Ser: 0.74 mg/dL (ref 0.40–1.20)
GFR: 78.76 mL/min (ref >60.00)
Glucose, Bld: 91 mg/dL (ref 70–99)
Potassium: 4 mEq/L (ref 3.5–5.1)
Sodium: 132 mEq/L — ABNORMAL LOW (ref 135–145)

## 2017-10-05 LAB — CBC WITH DIFFERENTIAL/PLATELET
Basophils Absolute: 0 10*3/uL (ref 0.0–0.1)
Basophils Relative: 0.3 % (ref 0.0–3.0)
Eosinophils Absolute: 0.1 10*3/uL (ref 0.0–0.7)
Eosinophils Relative: 0.8 % (ref 0.0–5.0)
HCT: 33.8 % — ABNORMAL LOW (ref 36.0–46.0)
Hemoglobin: 10.9 g/dL — ABNORMAL LOW (ref 12.0–15.0)
Lymphocytes Relative: 11.5 % — ABNORMAL LOW (ref 12.0–46.0)
Lymphs Abs: 0.9 10*3/uL (ref 0.7–4.0)
MCHC: 32.2 g/dL (ref 30.0–36.0)
MCV: 82.5 fl (ref 78.0–100.0)
Monocytes Absolute: 0.6 10*3/uL (ref 0.1–1.0)
Monocytes Relative: 7.3 % (ref 3.0–12.0)
Neutro Abs: 6.5 10*3/uL (ref 1.4–7.7)
Neutrophils Relative %: 80.1 % — ABNORMAL HIGH (ref 43.0–77.0)
Platelets: 350 10*3/uL (ref 150.0–400.0)
RBC: 4.1 Mil/uL (ref 3.87–5.11)
RDW: 19 % — ABNORMAL HIGH (ref 11.5–15.5)
WBC: 8.1 10*3/uL (ref 4.0–10.5)

## 2017-10-05 LAB — HEPATIC FUNCTION PANEL
ALT: 11 U/L (ref 0–35)
AST: 18 U/L (ref 0–37)
Albumin: 3.4 g/dL — ABNORMAL LOW (ref 3.5–5.2)
Alkaline Phosphatase: 188 U/L — ABNORMAL HIGH (ref 39–117)
Bilirubin, Direct: 0.2 mg/dL (ref 0.0–0.3)
Total Bilirubin: 0.6 mg/dL (ref 0.2–1.2)
Total Protein: 7.2 g/dL (ref 6.0–8.3)

## 2017-10-05 LAB — TSH: TSH: 3.71 u[IU]/mL (ref 0.35–4.50)

## 2017-10-05 MED ORDER — HYDROCODONE-ACETAMINOPHEN 5-325 MG PO TABS: 1.0000 | ORAL_TABLET | ORAL | 0 refills | Status: DC | PRN

## 2017-10-05 NOTE — Progress Notes (Signed)
   Subjective:    Patient ID: Bianca Matthews, female    DOB: Dec 26, 1927, 84 y.o.   MRN: [id number]  HPI Here with her son to discuss results from a recent scan and the recent onset of a purple discoloration in all of her fingers. The fingers tingle and burn at times, though they are not numb. Her toes and fine. She has been seeing Dr. Laurence Spates for low back pain, and he obtained an CT of the lumbar spine on 09-21-17 which showed numerous sclerotic lesions throughout the lumbar spine, pelvis, and ribs that were worrisome for blastic metastases. A likely source was felt to be breast cancer. Junia has not had a mammogram in many years and has declined each time we have suggested she get one. Dr. Ernestina Patches spoke to me over the phone about this scan and we agreed for him to set up a bone scan, a mammogram, and a referral to Oncology. [place] and the family are aware of the implications of all this.    Review of Systems  Constitutional: Negative.   Respiratory: Negative.   Cardiovascular: Negative.   Musculoskeletal: Positive for back pain.  Skin: Positive for color change.       Objective:   Physical Exam  Constitutional: She is oriented to person, place, and time. She appears well-developed and well-nourished.  Neck: No thyromegaly present.  Cardiovascular: Normal rate, regular rhythm, normal heart sounds and intact distal pulses.  Pulmonary/Chest: Effort normal and breath sounds normal. No respiratory distress. She has no wheezes. She has no rales.  Lymphadenopathy:    She has no cervical adenopathy.  Neurological: She is alert and oriented to person, place, and time.  Skin:  All 10 fingers are purple in color but warm. No tenderness. Radial pulses are full bilaterally.           Assessment & Plan:  She has numerous lesions in the spine, pelvis, and ribs suggesting metastatic cancer and she has an appt to see Oncology pending. She has a mammogram set up for 10-09-17. A bone scan is pending.  Meanwhile her back pain is fairly well controlled on her current regimen. We refilled her Norco to use prn.she appears to have a Raynauds syndrome in the hands and I suggested she keep the hands warm by wearing gloves. We will get labs today to further evaluate.  Alysia Penna, MD

## 2017-10-08 ENCOUNTER — Telehealth: Payer: Self-pay | Admitting: Family Medicine

## 2017-10-08 NOTE — Telephone Encounter (Signed)
Copied from [place]. Topic: Quick Communication - Other Results >> Oct 08, 2017 10:31 AM Cecelia Byars, NT wrote: Reason for POL:[id number] would like a call from PCP or his nurse ,in regards and she would like her lab results

## 2017-10-09 ENCOUNTER — Ambulatory Visit
Admission: RE | Admit: 2017-10-09 | Discharge: 2017-10-09 | Disposition: A | Discharge status: Home / Self Care | Payer: Medicare PPO | Source: Ambulatory Visit | Attending: Physical Medicine and Rehabilitation | Admitting: Physical Medicine and Rehabilitation

## 2017-10-09 ENCOUNTER — Ambulatory Visit: Admission: RE | Admit: 2017-10-09 | Payer: Medicare PPO | Source: Ambulatory Visit

## 2017-10-09 ENCOUNTER — Ambulatory Visit: Payer: Medicare PPO

## 2017-10-09 DIAGNOSIS — R9389 Abnormal findings on diagnostic imaging of other specified body structures: Secondary | ICD-10-CM

## 2017-10-10 ENCOUNTER — Telehealth (INDEPENDENT_AMBULATORY_CARE_PROVIDER_SITE_OTHER): Payer: Self-pay | Admitting: Physical Medicine and Rehabilitation

## 2017-10-10 MED ORDER — TEMAZEPAM 30 MG PO CAPS: 30.0000 mg | ORAL_CAPSULE | Freq: Every evening | ORAL | 2 refills | Status: AC | PRN

## 2017-10-10 NOTE — Telephone Encounter (Signed)
You could just say that mammogram was normal, bone scan may identify different source of cancer, Oncology will be better abe to give him prognosis etc. She should follow up with Dr. Sarajane Jews her primary physician. Pain medication is ok to give. No other updates. Spine injection likely not going to help but could consider depending clinical course.

## 2017-10-10 NOTE — Telephone Encounter (Signed)
Per her son, she is not sleeping. Call in Temazepam 30 mg qhs, #30 with 2 rf

## 2017-10-10 NOTE — Telephone Encounter (Signed)
I called the patient's son to advise. They saw Dr. Sarajane Jews last week to discuss medication for pain. I advised him that we would have to results for the bone scan on Monday and would work to get her another referral to oncology then.

## 2017-10-10 NOTE — Telephone Encounter (Signed)
Called and spoke to pt's son he was advised and voiced understanding and asked for the Rx to go to [facility]. Will call in Rx.

## 2017-10-12 ENCOUNTER — Encounter (HOSPITAL_COMMUNITY)
Admission: RE | Admit: 2017-10-12 | Discharge: 2017-10-12 | Disposition: A | Discharge status: Home / Self Care | Payer: Medicare PPO | Source: Ambulatory Visit | Attending: Physical Medicine and Rehabilitation | Admitting: Physical Medicine and Rehabilitation

## 2017-10-12 DIAGNOSIS — C7951 Secondary malignant neoplasm of bone: Secondary | ICD-10-CM

## 2017-10-12 DIAGNOSIS — M545 Low back pain: Secondary | ICD-10-CM | POA: Insufficient documentation

## 2017-10-12 DIAGNOSIS — G8929 Other chronic pain: Secondary | ICD-10-CM | POA: Insufficient documentation

## 2017-10-12 MED ORDER — TECHNETIUM TC 99M MEDRONATE IV KIT
25.0000 | PACK | Freq: Once | INTRAVENOUS | Status: AC | PRN
  Administered 2017-10-12: 25 via INTRAVENOUS

## 2017-10-14 ENCOUNTER — Inpatient Hospital Stay (HOSPITAL_COMMUNITY): Payer: Medicare PPO

## 2017-10-14 ENCOUNTER — Emergency Department (HOSPITAL_COMMUNITY): Payer: Medicare PPO

## 2017-10-14 ENCOUNTER — Inpatient Hospital Stay (HOSPITAL_COMMUNITY)
Admission: EM | Admit: 2017-10-14 | Discharge: 2017-10-17 | DRG: 640 | Disposition: A | Discharge status: Hospice | Payer: Medicare PPO | Attending: Internal Medicine | Admitting: Internal Medicine

## 2017-10-14 ENCOUNTER — Encounter (HOSPITAL_COMMUNITY): Payer: Self-pay | Admitting: Internal Medicine

## 2017-10-14 DIAGNOSIS — R590 Localized enlarged lymph nodes: Secondary | ICD-10-CM | POA: Diagnosis present

## 2017-10-14 DIAGNOSIS — Z79899 Other long term (current) drug therapy: Secondary | ICD-10-CM | POA: Diagnosis not present

## 2017-10-14 DIAGNOSIS — I10 Essential (primary) hypertension: Secondary | ICD-10-CM | POA: Diagnosis present

## 2017-10-14 DIAGNOSIS — G893 Neoplasm related pain (acute) (chronic): Secondary | ICD-10-CM | POA: Diagnosis present

## 2017-10-14 DIAGNOSIS — Z66 Do not resuscitate: Secondary | ICD-10-CM

## 2017-10-14 DIAGNOSIS — S22000A Wedge compression fracture of unspecified thoracic vertebra, initial encounter for closed fracture: Secondary | ICD-10-CM | POA: Diagnosis not present

## 2017-10-14 DIAGNOSIS — R0902 Hypoxemia: Secondary | ICD-10-CM | POA: Diagnosis present

## 2017-10-14 DIAGNOSIS — E860 Dehydration: Secondary | ICD-10-CM | POA: Diagnosis present

## 2017-10-14 DIAGNOSIS — C7951 Secondary malignant neoplasm of bone: Secondary | ICD-10-CM | POA: Diagnosis present

## 2017-10-14 DIAGNOSIS — N390 Urinary tract infection, site not specified: Secondary | ICD-10-CM | POA: Diagnosis present

## 2017-10-14 DIAGNOSIS — D630 Anemia in neoplastic disease: Secondary | ICD-10-CM | POA: Diagnosis present

## 2017-10-14 DIAGNOSIS — C799 Secondary malignant neoplasm of unspecified site: Secondary | ICD-10-CM

## 2017-10-14 DIAGNOSIS — C3491 Malignant neoplasm of unspecified part of right bronchus or lung: Secondary | ICD-10-CM | POA: Diagnosis present

## 2017-10-14 DIAGNOSIS — K219 Gastro-esophageal reflux disease without esophagitis: Secondary | ICD-10-CM | POA: Diagnosis present

## 2017-10-14 DIAGNOSIS — Z6825 Body mass index (BMI) 25.0-25.9, adult: Secondary | ICD-10-CM

## 2017-10-14 DIAGNOSIS — D649 Anemia, unspecified: Secondary | ICD-10-CM

## 2017-10-14 DIAGNOSIS — E43 Unspecified severe protein-calorie malnutrition: Secondary | ICD-10-CM | POA: Diagnosis present

## 2017-10-14 DIAGNOSIS — E871 Hypo-osmolality and hyponatremia: Secondary | ICD-10-CM | POA: Diagnosis not present

## 2017-10-14 DIAGNOSIS — Z7189 Other specified counseling: Secondary | ICD-10-CM

## 2017-10-14 DIAGNOSIS — R531 Weakness: Secondary | ICD-10-CM

## 2017-10-14 DIAGNOSIS — Z515 Encounter for palliative care: Secondary | ICD-10-CM | POA: Diagnosis not present

## 2017-10-14 DIAGNOSIS — R339 Retention of urine, unspecified: Secondary | ICD-10-CM

## 2017-10-14 DIAGNOSIS — C786 Secondary malignant neoplasm of retroperitoneum and peritoneum: Secondary | ICD-10-CM | POA: Diagnosis present

## 2017-10-14 DIAGNOSIS — E785 Hyperlipidemia, unspecified: Secondary | ICD-10-CM | POA: Diagnosis present

## 2017-10-14 DIAGNOSIS — C7931 Secondary malignant neoplasm of brain: Secondary | ICD-10-CM | POA: Diagnosis present

## 2017-10-14 DIAGNOSIS — M8458XA Pathological fracture in neoplastic disease, other specified site, initial encounter for fracture: Secondary | ICD-10-CM | POA: Diagnosis present

## 2017-10-14 DIAGNOSIS — C3490 Malignant neoplasm of unspecified part of unspecified bronchus or lung: Secondary | ICD-10-CM | POA: Diagnosis not present

## 2017-10-14 DIAGNOSIS — Z87891 Personal history of nicotine dependence: Secondary | ICD-10-CM

## 2017-10-14 LAB — CBC
HCT: 32.2 % — ABNORMAL LOW (ref 36.0–46.0)
Hemoglobin: 10.3 g/dL — ABNORMAL LOW (ref 12.0–15.0)
MCH: 26.2 pg (ref 26.0–34.0)
MCHC: 32 g/dL (ref 30.0–36.0)
MCV: 81.9 fL (ref 78.0–100.0)
Platelets: 322 10*3/uL (ref 150–400)
RBC: 3.93 MIL/uL (ref 3.87–5.11)
RDW: 18 % — ABNORMAL HIGH (ref 11.5–15.5)
WBC: 8.7 10*3/uL (ref 4.0–10.5)

## 2017-10-14 LAB — COMPREHENSIVE METABOLIC PANEL
ALT: 15 U/L (ref 14–54)
AST: 27 U/L (ref 15–41)
Albumin: 2.8 g/dL — ABNORMAL LOW (ref 3.5–5.0)
Alkaline Phosphatase: 200 U/L — ABNORMAL HIGH (ref 38–126)
Anion gap: 8 (ref 5–15)
BUN: 23 mg/dL — ABNORMAL HIGH (ref 6–20)
CO2: 30 mmol/L (ref 22–32)
Calcium: 8.9 mg/dL (ref 8.9–10.3)
Chloride: 91 mmol/L — ABNORMAL LOW (ref 101–111)
Creatinine, Ser: 0.68 mg/dL (ref 0.44–1.00)
GFR calc Af Amer: >60 mL/min (ref >60)
GFR calc non Af Amer: >60 mL/min (ref >60)
Glucose, Bld: 102 mg/dL — ABNORMAL HIGH (ref 65–99)
Potassium: 4.1 mmol/L (ref 3.5–5.1)
Sodium: 129 mmol/L — ABNORMAL LOW (ref 135–145)
Total Bilirubin: 0.6 mg/dL (ref 0.3–1.2)
Total Protein: 7.4 g/dL (ref 6.5–8.1)

## 2017-10-14 LAB — URINALYSIS, ROUTINE W REFLEX MICROSCOPIC
Bilirubin Urine: NEGATIVE
Glucose, UA: NEGATIVE mg/dL
Ketones, ur: NEGATIVE mg/dL
Nitrite: NEGATIVE
Protein, ur: NEGATIVE mg/dL
Specific Gravity, Urine: 1.006 (ref 1.005–1.030)
pH: 7 (ref 5.0–8.0)

## 2017-10-14 MED ORDER — PANTOPRAZOLE SODIUM 40 MG PO TBEC
40.0000 mg | DELAYED_RELEASE_TABLET | Freq: Every day | ORAL | Status: DC
  Administered 2017-10-15 – 2017-10-17 (×3): 40 mg via ORAL
  Filled 2017-10-14 (×3): qty 1

## 2017-10-14 MED ORDER — HYDROCODONE-ACETAMINOPHEN 5-325 MG PO TABS
1.0000 | ORAL_TABLET | ORAL | Status: DC | PRN
  Administered 2017-10-14 – 2017-10-17 (×8): 1 via ORAL
  Filled 2017-10-14 (×9): qty 1

## 2017-10-14 MED ORDER — ACETAMINOPHEN 650 MG RE SUPP: 650.0000 mg | Freq: Four times a day (QID) | RECTAL | Status: DC | PRN

## 2017-10-14 MED ORDER — TEMAZEPAM 15 MG PO CAPS
30.0000 mg | ORAL_CAPSULE | Freq: Every evening | ORAL | Status: DC | PRN
  Administered 2017-10-14: 30 mg via ORAL
  Filled 2017-10-14: qty 2

## 2017-10-14 MED ORDER — SODIUM CHLORIDE 0.9 % IV SOLN
INTRAVENOUS | Status: AC
  Administered 2017-10-14 – 2017-10-15 (×2): via INTRAVENOUS

## 2017-10-14 MED ORDER — IOPAMIDOL (ISOVUE-300) INJECTION 61%
INTRAVENOUS | Status: AC
  Administered 2017-10-14: 100 mL via INTRAVENOUS
  Filled 2017-10-14: qty 100

## 2017-10-14 MED ORDER — SODIUM CHLORIDE 0.9 % IV BOLUS (SEPSIS)
500.0000 mL | Freq: Once | INTRAVENOUS | Status: AC
  Administered 2017-10-14: 500 mL via INTRAVENOUS

## 2017-10-14 MED ORDER — HYDROMORPHONE HCL 1 MG/ML IJ SOLN
0.5000 mg | Freq: Once | INTRAMUSCULAR | Status: AC
  Administered 2017-10-14: 0.5 mg via INTRAVENOUS
  Filled 2017-10-14: qty 1

## 2017-10-14 MED ORDER — DEXTROSE 5 % IV SOLN
1.0000 g | INTRAVENOUS | Status: DC
  Administered 2017-10-15 – 2017-10-16 (×3): 1 g via INTRAVENOUS
  Filled 2017-10-14 (×4): qty 10

## 2017-10-14 MED ORDER — MORPHINE SULFATE (PF) 4 MG/ML IV SOLN
4.0000 mg | Freq: Once | INTRAVENOUS | Status: AC
  Administered 2017-10-14: 4 mg via INTRAVENOUS
  Filled 2017-10-14: qty 1

## 2017-10-14 MED ORDER — ATENOLOL 50 MG PO TABS
100.0000 mg | ORAL_TABLET | Freq: Two times a day (BID) | ORAL | Status: DC
  Administered 2017-10-14 – 2017-10-17 (×5): 100 mg via ORAL
  Filled 2017-10-14 (×6): qty 2

## 2017-10-14 MED ORDER — ENOXAPARIN SODIUM 40 MG/0.4ML ~~LOC~~ SOLN
40.0000 mg | Freq: Every day | SUBCUTANEOUS | Status: DC
  Administered 2017-10-14 – 2017-10-15 (×2): 40 mg via SUBCUTANEOUS
  Filled 2017-10-14 (×2): qty 0.4

## 2017-10-14 MED ORDER — ACETAMINOPHEN 325 MG PO TABS: 650.0000 mg | ORAL_TABLET | Freq: Four times a day (QID) | ORAL | Status: DC | PRN

## 2017-10-14 MED ORDER — POLYETHYLENE GLYCOL 3350 17 G PO PACK: 17.0000 g | PACK | Freq: Every day | ORAL | Status: DC | PRN

## 2017-10-14 MED ORDER — ROSUVASTATIN CALCIUM 10 MG PO TABS
10.0000 mg | ORAL_TABLET | Freq: Every day | ORAL | Status: DC
Start: 2017-10-14 — End: 2017-10-15
  Administered 2017-10-14: 10 mg via ORAL
  Filled 2017-10-14: qty 1

## 2017-10-14 MED ORDER — PRO-STAT SUGAR FREE PO LIQD
30.0000 mL | Freq: Two times a day (BID) | ORAL | Status: DC
  Administered 2017-10-14 – 2017-10-17 (×6): 30 mL via ORAL
  Filled 2017-10-14 (×6): qty 30

## 2017-10-14 MED ORDER — IOPAMIDOL (ISOVUE-300) INJECTION 61%
100.0000 mL | Freq: Once | INTRAVENOUS | Status: AC | PRN
  Administered 2017-10-14: 100 mL via INTRAVENOUS

## 2017-10-14 MED ORDER — ONDANSETRON HCL 4 MG/2ML IJ SOLN
4.0000 mg | Freq: Once | INTRAMUSCULAR | Status: AC
  Administered 2017-10-14: 4 mg via INTRAVENOUS
  Filled 2017-10-14: qty 2

## 2017-10-14 NOTE — ED Notes (Signed)
ED Provider at bedside. 

## 2017-10-14 NOTE — ED Notes (Signed)
Patient transported to X-ray 

## 2017-10-14 NOTE — ED Notes (Signed)
ED TO INPATIENT HANDOFF REPORT  Name/Age/Gender Bianca Matthews 84 y.o. female  Code Status Code Status History    Date Active Date Inactive Code Status Order ID Comments User Context   05/06/2017 00:27 05/08/2017 16:44 DNR [id number]  Edwin Dada, MD Inpatient   10/08/2016 20:47 10/11/2016 19:20 DNR [id number]  Karmen Bongo, MD Inpatient    Questions for Most Recent Historical Code Status (Order [id number])    Question Answer Comment   In the event of cardiac or respiratory ARREST Do not call a "code blue"    In the event of cardiac or respiratory ARREST Do not perform Intubation, CPR, defibrillation or ACLS    In the event of cardiac or respiratory ARREST Use medication by any route, position, wound care, and other measures to relive pain and suffering. May use oxygen, suction and manual treatment of airway obstruction as needed for comfort.       Home/SNF/Other Home  Chief Complaint Weakness  Level of Care/Admitting Diagnosis ED Disposition    ED Disposition Condition Comment   Admit  Hospital Area: [facility] [[id number]]  Level of Care: Telemetry [5]  Admit to tele based on following criteria: Monitor for Ischemic changes  Diagnosis: Hyponatremia [563875]  Admitting Physician: Jani Gravel [[id number]]  Attending Physician: Jani Gravel [phone]  Estimated length of stay: past midnight tomorrow  Certification:: I certify this patient will need inpatient services for at least 2 midnights  PT Class (Do Not Modify): Inpatient [101]  PT Acc Code (Do Not Modify): Private [1]       Medical History Past Medical History:  Diagnosis Date  . Allergy    sees Dr. [name]  . GERD (gastroesophageal reflux disease)   . Hyperlipidemia   . Hypertension   . Varicose veins     Allergies No Known Allergies  IV Location/Drains/Wounds Patient Lines/Drains/Airways Status   Active Line/Drains/Airways    Name:   Placement date:   Placement time:   Site:   Days:   Peripheral IV 10/14/17 Left Antecubital   10/14/17    1630    Antecubital   less than 1   Urethral Catheter [place], NT 14 Fr.   10/14/17    1835    -   less than 1   Wound / Incision (Open or Dehisced) 10/08/16 Laceration Head Medial;Mid   10/08/16    2300    Head   371          Labs/Imaging Results for orders placed or performed during the hospital encounter of 10/14/17 (from the past 48 hour(s))  CBC     Status: Abnormal   Collection Time: 10/14/17  4:43 PM  Result Value Ref Range   WBC 8.7 4.0 - 10.5 K/uL   RBC 3.93 3.87 - 5.11 MIL/uL   Hemoglobin 10.3 (L) 12.0 - 15.0 g/dL   HCT 32.2 (L) 36.0 - 46.0 %   MCV 81.9 78.0 - 100.0 fL   MCH 26.2 26.0 - 34.0 pg   MCHC 32.0 30.0 - 36.0 g/dL   RDW 18.0 (H) 11.5 - 15.5 %   Platelets 322 150 - 400 K/uL  Comprehensive metabolic panel     Status: Abnormal   Collection Time: 10/14/17  4:43 PM  Result Value Ref Range   Sodium 129 (L) 135 - 145 mmol/L   Potassium 4.1 3.5 - 5.1 mmol/L   Chloride 91 (L) 101 - 111 mmol/L   CO2 30 22 - 32 mmol/L   Glucose, Bld 102 (  H) 65 - 99 mg/dL   BUN 23 (H) 6 - 20 mg/dL   Creatinine, Ser 0.68 0.44 - 1.00 mg/dL   Calcium 8.9 8.9 - 10.3 mg/dL   Total Protein 7.4 6.5 - 8.1 g/dL   Albumin 2.8 (L) 3.5 - 5.0 g/dL   AST 27 15 - 41 U/L   ALT 15 14 - 54 U/L   Alkaline Phosphatase 200 (H) 38 - 126 U/L   Total Bilirubin 0.6 0.3 - 1.2 mg/dL   GFR calc non Af Amer >60 >60 mL/min   GFR calc Af Amer >60 >60 mL/min    Comment: (NOTE) The eGFR has been calculated using the CKD EPI equation. This calculation has not been validated in all clinical situations. eGFR's persistently <60 mL/min signify possible Chronic Kidney Disease.    Anion gap 8 5 - 15  Urinalysis, Routine w reflex microscopic     Status: Abnormal   Collection Time: 10/14/17  6:15 PM  Result Value Ref Range   Color, Urine YELLOW YELLOW   APPearance CLEAR CLEAR   Specific Gravity, Urine 1.006 1.005 - 1.030   pH 7.0 5.0 - 8.0   Glucose, UA  NEGATIVE NEGATIVE mg/dL   Hgb urine dipstick SMALL (A) NEGATIVE   Bilirubin Urine NEGATIVE NEGATIVE   Ketones, ur NEGATIVE NEGATIVE mg/dL   Protein, ur NEGATIVE NEGATIVE mg/dL   Nitrite NEGATIVE NEGATIVE   Leukocytes, UA MODERATE (A) NEGATIVE   RBC / HPF 0-5 0 - 5 RBC/hpf   WBC, UA 6-30 0 - 5 WBC/hpf   Bacteria, UA FEW (A) NONE SEEN   Squamous Epithelial / LPF 0-5 (A) NONE SEEN   Mucus PRESENT    Non Squamous Epithelial 0-5 (A) NONE SEEN   Dg Chest 2 View  Result Date: 10/14/2017 CLINICAL DATA:  84 y/o F; weakness, confusion, and loss of appetite for 2 weeks. EXAM: CHEST  2 VIEW COMPARISON:  05/05/2017 chest radiograph. FINDINGS: Normal cardiac silhouette. Aortic atherosclerosis with calcification. Pulmonary vascular congestion. Elevated right hemidiaphragm. No pleural effusion or pneumothorax. Mild interval anterior compression deformity of lower thoracic, probably T11 vertebral body. IMPRESSION: Pulmonary vascular congestion. Elevated right hemidiaphragm. Interval lower thoracic mild anterior compression deformity, probably T11. Electronically Signed   By: Kristine Garbe M.D.   On: 10/14/2017 18:04    Pending Labs Unresulted Labs (From admission, onward)   Start     Ordered   10/15/17 0500  Ferritin  Tomorrow morning,   R     10/14/17 2136   10/15/17 0500  Iron and TIBC  Tomorrow morning,   R     10/14/17 2136   10/15/17 0500  Vitamin B12  Tomorrow morning,   R     10/14/17 2136   10/15/17 0500  Folate RBC  Tomorrow morning,   R     10/14/17 2136   10/15/17 0500  Sedimentation rate  Tomorrow morning,   R     10/14/17 2136   10/14/17 2059  Culture, Urine  Add-on,   R     10/14/17 2058   Signed and Held  Creatinine, serum  (enoxaparin (LOVENOX)    CrCl >/= 30 ml/min)  Weekly,   R    Comments:  while on enoxaparin therapy    Signed and Held   Signed and Held  Comprehensive metabolic panel  Tomorrow morning,   R     Signed and Held   Signed and Held  CBC  Tomorrow  morning,   R  Signed and Held   Signed and Held  TSH  Add-on,   R     Signed and Held   Signed and Held  Cortisol  Once,   R     Signed and Held   Signed and Held  Osmolality  Once,   R     Signed and Held   Signed and Held  Sodium, urine, random  Once,   R     Signed and Held   Signed and Held  Osmolality, urine  Once,   R     Signed and Held      Vitals/Pain Today's Vitals   10/14/17 2000 10/14/17 2017 10/14/17 2030 10/14/17 2100  BP: 132/70 132/70 137/77 114/79  Pulse: 76 75 79 85  Resp:  16    Temp:      TempSrc:      SpO2: 97% 100% 100% 99%  PainSc:        Isolation Precautions No active isolations  Medications Medications  cefTRIAXone (ROCEPHIN) 1 g in dextrose 5 % 50 mL IVPB (not administered)  sodium chloride 0.9 % bolus 500 mL (0 mLs Intravenous Stopped 10/14/17 1956)  morphine 4 MG/ML injection 4 mg (4 mg Intravenous Given 10/14/17 1949)  ondansetron (ZOFRAN) injection 4 mg (4 mg Intravenous Given 10/14/17 1949)  HYDROmorphone (DILAUDID) injection 0.5 mg (0.5 mg Intravenous Given 10/14/17 2023)  iopamidol (ISOVUE-300) 61 % injection (100 mLs Intravenous Contrast Given 10/14/17 2124)  iopamidol (ISOVUE-300) 61 % injection 100 mL (100 mLs Intravenous Contrast Given 10/14/17 2136)    Mobility non-ambulatory (due to pain) normally walks with a walker

## 2017-10-14 NOTE — ED Triage Notes (Addendum)
Pt arrived via [facility] from home complaining of weakness, confusion, and loss of appetite x2 weeks. This is not baseline for pt. Normally energetic and A/O x4. Currently, pt is A/O x2/3 for EMS. Pt was diagnosed with a spinal tumor recently and presents to [facility] with generalized weakness. Pt given home prescription hydrocodone at 7am and home prescription tamazapam at 130.   Pt states "I don't know what happened to me. One minute I was at home with my son having dinner and now I'm here. I don't know what happened"

## 2017-10-14 NOTE — H&P (Addendum)
[facility] H&P   Patient Demographics:    Bianca Matthews, is a 84 y.o. female  MRN: [id number]   DOB - 10/03/1927  Admit Date - 10/14/2017  Outpatient Primary MD for the patient is Laurey Morale, MD  Referring MD/NP/PA: Lajean Saver  Outpatient Specialists:   Patient coming from: home  Chief Complaint  Patient presents with  . Weakness  . Urinary Retention      HPI:    Bianca Matthews  is a 84 y.o. female, w hypertension, hyperlipidemia, gerd, apparently has had 40 lbs of weight loss since August per her family.  Pt has recently been seen to have numerous sclerotic lesions throught the lumbar spine pelvis and ribs that were worrisome for blastic metastasis on CT scan 09/21/2017.   She apparently has had significant decline in the past 48 hours.  Slight confusion, and decrease in po intake and generalized weakness.  Pt also notes generalized pain esp in the back.   In ED,  Wbc 8.7, Hgb 10.3, Plt 322  Na 129, K 4.1, Bun 23, Creatinine 0.68 Ast 27, Alt 15 Alk phos 200 T. Bili 0.6  Urinalysis wbc 6-30  Pt will be admitted for UTI, and hyponatremia,  and metastatic disease   Review of systems:    In addition to the HPI above,  No Fever-chills, No Headache, No changes with Vision or hearing, No problems swallowing food or Liquids, No Chest pain, Cough or Shortness of Breath, No Abdominal pain, No Nausea or Vommitting, Bowel movements are regular, No Blood in stool or Urine, No dysuria, No new skin rashes or bruises, +weight loss No polyuria, polydypsia or polyphagia, No significant Mental Stressors.  A full 10 point Review of Systems was done, except as stated above, all other Review of Systems were negative.   With Past History of the following :    Past Medical History:  Diagnosis Date  . Allergy    sees Dr. [name]  . GERD (gastroesophageal reflux disease)   .  Hyperlipidemia   . Hypertension   . Varicose veins       Past Surgical History:  Procedure Laterality Date  . APPENDECTOMY    . TONSILLECTOMY        Social History:     Social History   Tobacco Use  . Smoking status: Former Smoker    Packs/day: 0.50    Years: 10.00    Pack years: 5.00  . Smokeless tobacco: Never Used  Substance Use Topics  . Alcohol use: No    Alcohol/week: 0.0 oz     Lives - at home  Mobility - previously walked by self   Family History :     Family History  Problem Relation Age of Onset  . Hypertension Unknown   . Heart disease Unknown       Home Medications:   Prior to Admission medications  Medication Sig Start Date End Date Taking? Authorizing Provider  atenolol (TENORMIN) 100 MG tablet Take 100 mg by mouth 2 (two) times daily.    Yes [provider]  furosemide (LASIX) 40 MG tablet Take 1 tablet (40 mg total) by mouth 2 (two) times daily. 05/11/17  Yes Laurey Morale, MD  HYDROcodone-acetaminophen (NORCO/VICODIN) 5-325 MG tablet Take 1 tablet by mouth every 4 (four) hours as needed for moderate pain. 10/05/17  Yes Laurey Morale, MD  [facility] PAIN RELIEF PATCH EX) Apply 1 patch topically as needed (pain).   Yes [provider]  magnesium hydroxide (MILK OF MAGNESIA) 400 MG/5ML suspension Take 15 mLs by mouth daily as needed for mild constipation.   Yes [provider]  Menthol, Topical Analgesic, (BIOFREEZE ROLL-ON) 4 % GEL Apply 1 application topically as needed (pain).   Yes [provider]  omeprazole (PRILOSEC) 40 MG capsule TAKE 1 CAPSULE EVERY DAY 07/23/17  Yes Laurey Morale, MD  polyethylene glycol (MIRALAX / GLYCOLAX) packet Take 17 g by mouth daily as needed for mild constipation. 05/08/17  Yes Dessa Phi, DO  potassium chloride (KLOR-CON 10) 10 MEQ tablet Take 1 tablet (10 mEq total) by mouth 2 (two) times daily. 05/14/17  Yes Laurey Morale, MD  Probiotic Product (PROBIOTIC FORMULA  PO) Take 1 tablet by mouth daily.    Yes [provider]  rosuvastatin (CRESTOR) 10 MG tablet TAKE 1 TABLET AT BEDTIME 04/06/17  Yes Laurey Morale, MD  temazepam (RESTORIL) 30 MG capsule Take 1 capsule (30 mg total) by mouth at bedtime as needed for sleep. 10/10/17  Yes Laurey Morale, MD  baclofen (LIORESAL) 10 MG tablet Take 1 tablet (10 mg total) by mouth every 8 (eight) hours as needed for muscle spasms (Pain). Patient not taking: Reported on 10/05/2017 10/03/17   Magnus Sinning, MD     Allergies:    No Known Allergies   Physical Exam:   Vitals  Blood pressure 132/70, pulse 75, temperature (!) 97.4 F (36.3 C), temperature source Oral, resp. rate 16, SpO2 100 %.   1. General  lying in bed in NAD,    2. Normal affect and insight, Not Suicidal or Homicidal, Awake Alert, Oriented X 3.  3. No F.N deficits, ALL C.Nerves Intact, Strength 5/5 all 4 extremities, Sensation intact all 4 extremities, Plantars down going.  4. Ears and Eyes appear Normal, Conjunctivae clear, PERRLA. Moist Oral Mucosa.  5. Supple Neck, No JVD, No cervical lymphadenopathy appriciated, No Carotid Bruits.  6. Symmetrical Chest wall movement, Good air movement bilaterally, CTAB.  7. RRR, No Gallops, Rubs or Murmurs, No Parasternal Heave.  8. Positive Bowel Sounds, Abdomen Soft, No tenderness, No organomegaly appriciated,No rebound -guarding or rigidity.  9.  No Cyanosis,  1+ edema  10. Good muscle tone,  joints appear normal , no effusions, Normal ROM.  11.  + adenopathy (cervical)   Data Review:    CBC Recent Labs  Lab 10/14/17 1643  WBC 8.7  HGB 10.3*  HCT 32.2*  PLT 322  MCV 81.9  MCH 26.2  MCHC 32.0  RDW 18.0*   ------------------------------------------------------------------------------------------------------------------  Chemistries  Recent Labs  Lab 10/14/17 1643  NA 129*  K 4.1  CL 91*  CO2 30  GLUCOSE 102*  BUN 23*  CREATININE 0.68  CALCIUM 8.9  AST 27  ALT 15   ALKPHOS 200*  BILITOT 0.6   ------------------------------------------------------------------------------------------------------------------ estimated creatinine clearance is 47 mL/min (by C-G formula based  on SCr of 0.68 mg/dL). ------------------------------------------------------------------------------------------------------------------ No results for input(s): TSH, T4TOTAL, T3FREE, THYROIDAB in the last 72 hours.  Invalid input(s): FREET3  Coagulation profile No results for input(s): INR, PROTIME in the last 168 hours. ------------------------------------------------------------------------------------------------------------------- No results for input(s): DDIMER in the last 72 hours. -------------------------------------------------------------------------------------------------------------------  Cardiac Enzymes No results for input(s): CKMB, TROPONINI, MYOGLOBIN in the last 168 hours.  Invalid input(s): CK ------------------------------------------------------------------------------------------------------------------    Component Value Date/Time   BNP 123.9 (H) 05/05/2017 2219     ---------------------------------------------------------------------------------------------------------------  Urinalysis    Component Value Date/Time   COLORURINE YELLOW 10/14/2017 1815   APPEARANCEUR CLEAR 10/14/2017 1815   LABSPEC 1.006 10/14/2017 1815   PHURINE 7.0 10/14/2017 1815   GLUCOSEU NEGATIVE 10/14/2017 1815   HGBUR SMALL (A) 10/14/2017 1815   BILIRUBINUR NEGATIVE 10/14/2017 1815   BILIRUBINUR n 02/24/2015 0937   KETONESUR NEGATIVE 10/14/2017 1815   PROTEINUR NEGATIVE 10/14/2017 1815   UROBILINOGEN 0.2 02/24/2015 0937   NITRITE NEGATIVE 10/14/2017 1815   LEUKOCYTESUR MODERATE (A) 10/14/2017 1815    ----------------------------------------------------------------------------------------------------------------   Imaging Results:    Dg Chest 2 View  Result  Date: 10/14/2017 CLINICAL DATA:  84 y/o F; weakness, confusion, and loss of appetite for 2 weeks. EXAM: CHEST  2 VIEW COMPARISON:  05/05/2017 chest radiograph. FINDINGS: Normal cardiac silhouette. Aortic atherosclerosis with calcification. Pulmonary vascular congestion. Elevated right hemidiaphragm. No pleural effusion or pneumothorax. Mild interval anterior compression deformity of lower thoracic, probably T11 vertebral body. IMPRESSION: Pulmonary vascular congestion. Elevated right hemidiaphragm. Interval lower thoracic mild anterior compression deformity, probably T11. Electronically Signed   By: Kristine Garbe M.D.   On: 10/14/2017 18:04     Assessment & Plan:    Active Problems:   Hyponatremia   Hypoxia   Anemia   Compression fracture of body of thoracic vertebra (HCC)   Metastatic cancer (HCC)    Hyponatremia Check serum osm, tsh, cortisol Check urine sodium, urine osm Hydrate gently w ns iv  UTI Rocephin 1 gm iv qday  Weight loss, Metastatic cancer CT scan Brain, Abd/ pelvis,   Compression fracture T11 Consider MRI L spine  In am  Anemia Check ferritin, iron, tibc, folate, b12, ESR  Severe protein calorie malnutrition  prostat  Low pox ? In Room pt had pox 97-99% on RA  DVT Prophylaxis   Lovenox - SCDs  AM Labs Ordered, also please review Full Orders  Family Communication: Admission, patients condition and plan of care including tests being ordered have been discussed with the patient  who indicate understanding and agree with the plan and Code Status.  Code Status DNR  Likely DC to  home  Condition GUARDED    Consults called: none  Admission status: inpatient  Time spent in minutes : 45   Jani Gravel M.D on 10/14/2017 at 9:11 PM  Between 7am to 7pm - Pager - [phone]   After 7pm go to [email or web address] - password [facility]  [facility] - Office  [phone]

## 2017-10-14 NOTE — ED Notes (Signed)
Pt grimacing when attempting to urinate. This RN bladder scanned patient and found >991mLs of urine in her bladder. Dr. Zenia Resides gave verbal order to insert foley catheter.

## 2017-10-14 NOTE — ED Notes (Signed)
Bed: WA20 Expected date:  Expected time:  Means of arrival:  Comments: Gen weakness

## 2017-10-14 NOTE — ED Provider Notes (Addendum)
[facility] DEPT Provider Note   CSN: [id number] Arrival date & time: 10/14/17  1620     History   Chief Complaint Chief Complaint  Patient presents with  . Weakness    HPI Bianca Matthews is a 84 y.o. female.  Patient c/o generalized weakness and decreased appetite w poor po intake x 2 weeks. Symptoms gradual onset, constant, persistent, worsening, moderate to severe. Denies same symptoms previously. No acute or abrupt change today. Also c/o pain all over body especially lower back. Denies recent fall or injury. No radicular pain. No numbness or any unilateral or focal weakness.  Denies fever or chills. Unspecified wt loss.    The history is provided by the patient.  Weakness  Pertinent negatives include no shortness of breath, no chest pain, no vomiting, no confusion and no headaches.    Past Medical History:  Diagnosis Date  . Allergy    sees Dr. [name]  . GERD (gastroesophageal reflux disease)   . Hyperlipidemia   . Hypertension   . Varicose veins     Patient Active Problem List   Diagnosis Date Noted  . Spondylosis without myelopathy or radiculopathy, lumbar region 08/20/2017  . AKI (acute kidney injury) ([place]) 05/05/2017  . Hyponatremia 05/05/2017  . Chronic respiratory failure with hypoxia and hypercapnia ([place]) 11/04/2016  . Morbid obesity due to excess calories ([place]) 11/04/2016  . Syncope 10/27/2016  . Skull fracture with concussion ([place]) 10/08/2016  . Diastolic CHF, chronic ([place]) 10/08/2016  . Hypokalemia 10/08/2016  . Hyperlipidemia 12/02/2015  . Hyperglycemia 02/24/2015  . Other and unspecified hyperlipidemia 06/16/2014  . Chronic midline low back pain without sciatica 06/16/2014  . Anxiety state 01/30/2014  . Rash 03/14/2011  . ANKLE PAIN 08/31/2010  . Arthropathy, multiple sites 06/27/2010  . Allergic rhinitis 05/06/2008  . DEPENDENT EDEMA 01/29/2008  . Essential hypertension 06/11/2007  . GERD 06/11/2007    Past  Surgical History:  Procedure Laterality Date  . APPENDECTOMY    . TONSILLECTOMY      OB History    Gravida Para Term Preterm AB Living   3 3           SAB TAB Ectopic Multiple Live Births                   Home Medications    Prior to Admission medications   Medication Sig Start Date End Date Taking? Authorizing Provider  aspirin 325 MG tablet Take 325 mg by mouth at bedtime.     [provider]  atenolol (TENORMIN) 100 MG tablet Take 100 mg by mouth 2 (two) times daily.     [provider]  baclofen (LIORESAL) 10 MG tablet Take 1 tablet (10 mg total) by mouth every 8 (eight) hours as needed for muscle spasms (Pain). Patient not taking: Reported on 10/05/2017 10/03/17   Magnus Sinning, MD  furosemide (LASIX) 40 MG tablet Take 1 tablet (40 mg total) by mouth 2 (two) times daily. 05/11/17   Laurey Morale, MD  HYDROcodone-acetaminophen (NORCO/VICODIN) 5-325 MG tablet Take 1 tablet by mouth every 4 (four) hours as needed for moderate pain. 10/05/17   Laurey Morale, MD  omeprazole (PRILOSEC) 40 MG capsule TAKE 1 CAPSULE EVERY DAY 07/23/17   Laurey Morale, MD  polyethylene glycol [facility] / Floria Raveling) packet Take 17 g by mouth daily as needed for mild constipation. 05/08/17   Dessa Phi, DO  potassium chloride (KLOR-CON 10) 10 MEQ tablet Take 1 tablet (10  mEq total) by mouth 2 (two) times daily. 05/14/17   Laurey Morale, MD  Probiotic Product (PROBIOTIC FORMULA PO) Take 1 tablet by mouth daily.     [provider]  rosuvastatin (CRESTOR) 10 MG tablet TAKE 1 TABLET AT BEDTIME 04/06/17   Laurey Morale, MD  temazepam (RESTORIL) 30 MG capsule Take 1 capsule (30 mg total) by mouth at bedtime as needed for sleep. 10/10/17   Laurey Morale, MD    Family History Family History  Problem Relation Age of Onset  . Hypertension Unknown   . Heart disease Unknown     Social History Social History   Tobacco Use  . Smoking status: Former Smoker    Packs/day: 0.50     Years: 10.00    Pack years: 5.00  . Smokeless tobacco: Never Used  Substance Use Topics  . Alcohol use: No    Alcohol/week: 0.0 oz  . Drug use: No     Allergies   Patient has no known allergies.   Review of Systems Review of Systems  Constitutional: Negative for chills and fever.  HENT: Negative for sore throat.   Eyes: Negative for visual disturbance.  Respiratory: Negative for cough and shortness of breath.   Cardiovascular: Negative for chest pain.  Gastrointestinal: Negative for abdominal pain, diarrhea and vomiting.  Endocrine: Negative for polyuria.  Genitourinary: Negative for dysuria and flank pain.  Musculoskeletal: Positive for back pain. Negative for neck pain.  Skin: Negative for rash.  Neurological: Positive for weakness. Negative for numbness and headaches.  Hematological: Does not bruise/bleed easily.  Psychiatric/Behavioral: Negative for confusion.     Physical Exam Updated Vital Signs BP 104/61   Pulse 72   Temp (!) 97.4 F (36.3 C) (Oral)   Resp 16   SpO2 94%   Physical Exam  Constitutional: She appears well-developed and well-nourished. No distress.  HENT:  Head: Atraumatic.  Dry mm.   Eyes: Conjunctivae are normal. Pupils are equal, round, and reactive to light. No scleral icterus.  Neck: Neck supple. No tracheal deviation present. No thyromegaly present.  Cardiovascular: Normal rate, regular rhythm, normal heart sounds and intact distal pulses. Exam reveals no gallop and no friction rub.  No murmur heard. Pulmonary/Chest: Effort normal and breath sounds normal. No respiratory distress.  Abdominal: Soft. Normal appearance and bowel sounds are normal. She exhibits no distension. There is no tenderness.  Genitourinary:  Genitourinary Comments: No cva tenderness.   Musculoskeletal: She exhibits edema.  Bilateral leg edema, symmetric, moderate. CTLS spine, non tender, aligned, no step off.   Neurological: She is alert.  Speech clear/fluent.  Motor intact bil, sens grossly intact.    Skin: Skin is warm and dry. No rash noted. She is not diaphoretic.  Psychiatric: She has a normal mood and affect.  Nursing note and vitals reviewed.    ED Treatments / Results  Labs (all labs ordered are listed, but only abnormal results are displayed) Results for orders placed or performed in visit on 10/05/17  Hepatic function panel  Result Value Ref Range   Total Bilirubin 0.6 0.2 - 1.2 mg/dL   Bilirubin, Direct 0.2 0.0 - 0.3 mg/dL   Alkaline Phosphatase 188 (H) 39 - 117 U/L   AST 18 0 - 37 U/L   ALT 11 0 - 35 U/L   Total Protein 7.2 6.0 - 8.3 g/dL   Albumin 3.4 (L) 3.5 - 5.2 g/dL  TSH  Result Value Ref Range   TSH 3.71 0.35 -  4.50 uIU/mL  Basic metabolic panel  Result Value Ref Range   Sodium 132 (L) 135 - 145 mEq/L   Potassium 4.0 3.5 - 5.1 mEq/L   Chloride 89 (L) 96 - 112 mEq/L   CO2 30 19 - 32 mEq/L   Glucose, Bld 91 70 - 99 mg/dL   BUN 22 6 - 23 mg/dL   Creatinine, Ser 0.74 0.40 - 1.20 mg/dL   Calcium 9.2 8.4 - 10.5 mg/dL   GFR 78.76 >60.00 mL/min  CBC with Differential/Platelet  Result Value Ref Range   WBC 8.1 4.0 - 10.5 K/uL   RBC 4.10 3.87 - 5.11 Mil/uL   Hemoglobin 10.9 (L) 12.0 - 15.0 g/dL   HCT 33.8 (L) 36.0 - 46.0 %   MCV 82.5 78.0 - 100.0 fl   MCHC 32.2 30.0 - 36.0 g/dL   RDW 19.0 (H) 11.5 - 15.5 %   Platelets 350.0 150.0 - 400.0 K/uL   Neutrophils Relative % 80.1 (H) 43.0 - 77.0 %   Lymphocytes Relative 11.5 (L) 12.0 - 46.0 %   Monocytes Relative 7.3 3.0 - 12.0 %   Eosinophils Relative 0.8 0.0 - 5.0 %   Basophils Relative 0.3 0.0 - 3.0 %   Neutro Abs 6.5 1.4 - 7.7 K/uL   Lymphs Abs 0.9 0.7 - 4.0 K/uL   Monocytes Absolute 0.6 0.1 - 1.0 K/uL   Eosinophils Absolute 0.1 0.0 - 0.7 K/uL   Basophils Absolute 0.0 0.0 - 0.1 K/uL   Ct Lumbar Spine Wo Contrast  Result Date: 10/01/2017 CLINICAL DATA:  Low back pain for several years. No acute injury, prior relevant surgery or history of malignancy. EXAM: CT  LUMBAR SPINE WITHOUT CONTRAST TECHNIQUE: Multidetector CT imaging of the lumbar spine was performed without intravenous contrast administration. Multiplanar CT image reconstructions were also generated. COMPARISON:  Office lumbar radiographs 08/01/2017. Abdominal CT 06/13/2005. FINDINGS: Segmentation: Transitional lumbosacral anatomy. The lower ribs are difficult to visualize on prior chest radiographs. For the purposes of this report, it is assumed that the transitional segment is L5, partially sacralized. Alignment: 6 mm of degenerative anterolisthesis at L4-5. Otherwise normal. Vertebrae: There are numerous sclerotic lesions throughout the lumbar spine, ribs and pelvis which are new from the prior abdominal CT, highly worrisome for blastic metastatic disease. These are most prominent at T12, L1, L3, L4 and L5. No lytic lesion or pathologic fracture identified. There is some periosteal reaction surrounding the right L3 transverse process. Mild sacroiliac degenerative changes bilaterally. Paraspinal and other soft tissues: No paraspinal masses or retroperitoneal lymphadenopathy demonstrated. There is extensive aortic and branch vessel atherosclerosis. There is atelectasis at the lung bases. Disc levels: No significant disc space findings are seen from T10-11 through L1-2. L2-3: Mild disc bulging. No spinal stenosis or nerve root encroachment. L3-4: Disc bulging with facet and ligamentous hypertrophy. There is mild triangulation of the thecal sac with mild narrowing of the left lateral recess. No foraminal compromise. L4-5: Advanced facet disease accounting for the grade 1 anterolisthesis. There is loss of disc height with annular disc bulging eccentric to the right. These factors contribute to mild spinal stenosis and mild narrowing of the right lateral recess and right foramen. L5-S1: As numbered, this is a transitional disc space level without acquired abnormality. IMPRESSION: 1. Transitional lumbosacral anatomy.  L5 is assigned the transitional segment. 2. Degenerative anterolisthesis at L4-5 contributing to mild multifactorial spinal stenosis and mild narrowing of the right lateral recess and right foramen. 3. No other significant spinal stenosis or nerve  root encroachment. 4. New sclerotic lesions throughout the visualized spine, ribs and bony pelvis worrisome for metastatic disease. Breast cancer would be the most likely explanation. Correlation with mammography recommended if not recently performed. Bone scan may be helpful to ascertain the extent of osseous metastatic disease. 5. These results will be called to the ordering clinician or representative by the Radiologist Assistant, and communication documented in the PACS or zVision Dashboard. Electronically Signed   By: Richardean Sale M.D.   On: 10/01/2017 10:51   Nm Bone Scan Whole Body  Result Date: 10/13/2017 CLINICAL DATA:  For metastatic disease of unknown type. EXAM: NUCLEAR MEDICINE WHOLE BODY BONE SCAN TECHNIQUE: Whole body anterior and posterior images were obtained approximately 3 hours after intravenous injection of radiopharmaceutical. RADIOPHARMACEUTICALS:  20.9 mCi Technetium-60m MDP IV COMPARISON:  None. FINDINGS: Extensive abnormal bony uptake noted throughout the thoracic and lumbar spine, ribs bilaterally, sternum, right greater than left, bony pelvis, bilateral proximal femurs, distal left femur and proximal left tibia compatible with metastatic disease. Soft tissue activity unremarkable. IMPRESSION: Extensive abnormal bony uptake compatible with bony metastatic disease. Electronically Signed   By: Rolm Baptise M.D.   On: 10/13/2017 07:30   Mm Diag Breast Tomo Bilateral  Result Date: 10/09/2017 CLINICAL DATA:  Patient had a recent lumbar spine CT that showed sclerotic bone lesions suspicious for metastatic disease.Patient has no current breast complaints. Exam is to evaluate for a possible breast cancer primary. EXAM: 2D DIGITAL DIAGNOSTIC  BILATERAL MAMMOGRAM WITH CAD AND ADJUNCT TOMO COMPARISON:  None ACR Breast Density Category b: There are scattered areas of fibroglandular density. FINDINGS: There are no discrete masses, areas of architectural distortion, areas of significant asymmetry or suspicious calcifications. Mammographic images were processed with CAD. IMPRESSION: Negative exam.  No evidence of malignancy. RECOMMENDATION: Screening mammogram in one year.(Code:SM-B-01Y) I have discussed the findings and recommendations with the patient. Results were also provided in writing at the conclusion of the visit. If applicable, a reminder letter will be sent to the patient regarding the next appointment. BI-RADS CATEGORY  1: Negative. Electronically Signed   By: Lajean Manes M.D.   On: 10/09/2017 14:46    EKG  EKG Interpretation  Date/Time:  Sunday October 14 2017 17:17:56 EST Ventricular Rate:  73 PR Interval:  182 QRS Duration: 80 QT Interval:  398 QTC Calculation: 438 R Axis:   35 Text Interpretation:  Sinus rhythm Poor data quality Confirmed by Lajean Saver [phone]) on 10/14/2017 5:29:05 PM       Radiology Dg Chest 2 View  Result Date: 10/14/2017 CLINICAL DATA:  84 y/o F; weakness, confusion, and loss of appetite for 2 weeks. EXAM: CHEST  2 VIEW COMPARISON:  05/05/2017 chest radiograph. FINDINGS: Normal cardiac silhouette. Aortic atherosclerosis with calcification. Pulmonary vascular congestion. Elevated right hemidiaphragm. No pleural effusion or pneumothorax. Mild interval anterior compression deformity of lower thoracic, probably T11 vertebral body. IMPRESSION: Pulmonary vascular congestion. Elevated right hemidiaphragm. Interval lower thoracic mild anterior compression deformity, probably T11. Electronically Signed   By: Kristine Garbe M.D.   On: 10/14/2017 18:04    Procedures Procedures (including critical care time)  Medications Ordered in ED Medications  sodium chloride 0.9 % bolus 500 mL (not  administered)     Initial Impression / Assessment and Plan / ED Course  I have reviewed the triage vital signs and the nursing notes.  Pertinent labs & imaging results that were available during my care of the patient were reviewed by me and considered in my medical  decision making (see chart for details).  Iv ns. Labs.  Imaging.   Reviewed nursing notes and prior charts for additional history.   Additional ns iv.   Patient with large amount urine on bladder scan.  Foley. Approximately 1 liter urine output.   Morphine for pain.   Patients back/bone pain persists. Unrelieved.  Dilaudid iv.   Will admit for ivf/hydration, pain control, and further workup.  Hospitalists consulted for admission.    Final Clinical Impressions(s) / ED Diagnoses   Final diagnoses:  None    ED Discharge Orders    None          Lajean Saver, MD 10/14/17 2022

## 2017-10-15 ENCOUNTER — Inpatient Hospital Stay (HOSPITAL_COMMUNITY): Payer: Medicare PPO

## 2017-10-15 ENCOUNTER — Other Ambulatory Visit: Payer: Self-pay

## 2017-10-15 DIAGNOSIS — C7931 Secondary malignant neoplasm of brain: Secondary | ICD-10-CM

## 2017-10-15 DIAGNOSIS — F411 Generalized anxiety disorder: Secondary | ICD-10-CM

## 2017-10-15 DIAGNOSIS — Z515 Encounter for palliative care: Secondary | ICD-10-CM

## 2017-10-15 DIAGNOSIS — E43 Unspecified severe protein-calorie malnutrition: Secondary | ICD-10-CM

## 2017-10-15 DIAGNOSIS — G893 Neoplasm related pain (acute) (chronic): Secondary | ICD-10-CM

## 2017-10-15 DIAGNOSIS — R531 Weakness: Secondary | ICD-10-CM

## 2017-10-15 DIAGNOSIS — Z66 Do not resuscitate: Secondary | ICD-10-CM

## 2017-10-15 DIAGNOSIS — C3490 Malignant neoplasm of unspecified part of unspecified bronchus or lung: Secondary | ICD-10-CM

## 2017-10-15 DIAGNOSIS — N390 Urinary tract infection, site not specified: Secondary | ICD-10-CM

## 2017-10-15 LAB — CBC
HCT: 29.7 % — ABNORMAL LOW (ref 36.0–46.0)
Hemoglobin: 9.4 g/dL — ABNORMAL LOW (ref 12.0–15.0)
MCH: 26.1 pg (ref 26.0–34.0)
MCHC: 31.6 g/dL (ref 30.0–36.0)
MCV: 82.5 fL (ref 78.0–100.0)
Platelets: 301 10*3/uL (ref 150–400)
RBC: 3.6 MIL/uL — ABNORMAL LOW (ref 3.87–5.11)
RDW: 18 % — ABNORMAL HIGH (ref 11.5–15.5)
WBC: 8.1 10*3/uL (ref 4.0–10.5)

## 2017-10-15 LAB — IRON AND TIBC
Iron: 19 ug/dL — ABNORMAL LOW (ref 28–170)
Saturation Ratios: 9 % — ABNORMAL LOW (ref 10.4–31.8)
TIBC: 210 ug/dL — ABNORMAL LOW (ref 250–450)
UIBC: 191 ug/dL

## 2017-10-15 LAB — SODIUM, URINE, RANDOM: Sodium, Ur: 31 mmol/L

## 2017-10-15 LAB — COMPREHENSIVE METABOLIC PANEL
ALT: 12 U/L — ABNORMAL LOW (ref 14–54)
AST: 19 U/L (ref 15–41)
Albumin: 2.3 g/dL — ABNORMAL LOW (ref 3.5–5.0)
Alkaline Phosphatase: 165 U/L — ABNORMAL HIGH (ref 38–126)
Anion gap: 10 (ref 5–15)
BUN: 17 mg/dL (ref 6–20)
CO2: 27 mmol/L (ref 22–32)
Calcium: 8.5 mg/dL — ABNORMAL LOW (ref 8.9–10.3)
Chloride: 97 mmol/L — ABNORMAL LOW (ref 101–111)
Creatinine, Ser: 0.57 mg/dL (ref 0.44–1.00)
GFR calc Af Amer: >60 mL/min (ref >60)
GFR calc non Af Amer: >60 mL/min (ref >60)
Glucose, Bld: 99 mg/dL (ref 65–99)
Potassium: 3.6 mmol/L (ref 3.5–5.1)
Sodium: 134 mmol/L — ABNORMAL LOW (ref 135–145)
Total Bilirubin: 0.4 mg/dL (ref 0.3–1.2)
Total Protein: 6.2 g/dL — ABNORMAL LOW (ref 6.5–8.1)

## 2017-10-15 LAB — VITAMIN B12: Vitamin B-12: 972 pg/mL — ABNORMAL HIGH (ref 180–914)

## 2017-10-15 LAB — TSH: TSH: 2.495 u[IU]/mL (ref 0.350–4.500)

## 2017-10-15 LAB — SEDIMENTATION RATE: Sed Rate: 102 mm/hr — ABNORMAL HIGH (ref 0–22)

## 2017-10-15 LAB — OSMOLALITY, URINE: Osmolality, Ur: 240 mOsm/kg — ABNORMAL LOW (ref 300–900)

## 2017-10-15 LAB — OSMOLALITY: Osmolality: 280 mOsm/kg (ref 275–295)

## 2017-10-15 LAB — CORTISOL: Cortisol, Plasma: 19 ug/dL

## 2017-10-15 LAB — FERRITIN: Ferritin: 255 ng/mL (ref 11–307)

## 2017-10-15 MED ORDER — LORAZEPAM 0.5 MG PO TABS
0.5000 mg | ORAL_TABLET | Freq: Three times a day (TID) | ORAL | Status: DC
  Administered 2017-10-16 – 2017-10-17 (×5): 0.5 mg via ORAL
  Filled 2017-10-15 (×6): qty 1

## 2017-10-15 MED ORDER — ENSURE ENLIVE PO LIQD: 237.0000 mL | Freq: Two times a day (BID) | ORAL | Status: DC | PRN

## 2017-10-15 MED ORDER — LORAZEPAM 2 MG/ML IJ SOLN
0.5000 mg | Freq: Once | INTRAMUSCULAR | Status: AC
  Administered 2017-10-15: 0.5 mg via INTRAVENOUS
  Filled 2017-10-15: qty 1

## 2017-10-15 NOTE — Consult Note (Signed)
Consultation Note Date: 10/15/2017   Patient Name: Bianca Matthews  DOB: 27-Mar-1928  MRN: [id number]  Age / Sex: 84 y.o., female  PCP: Bianca Morale, MD Referring Physician: Aline August, MD  Reason for Consultation: Establishing goals of care, Non pain symptom management and Psychosocial/spiritual support  HPI/Patient Profile: 84 y.o. female  admitted on 10/14/2017 with  PMH of HTN, hyperlipidemia who was admitted due to increasing confusion, weakness, and urinary retention.   Are her family she has had continued physical, functional decline and significant weight loss over 40 pounds in the last 6 months.  CT scan from 10/14/2017 suggestive of  primary metastatic lung carcinoma, suspected nodal metastatic disease, peritoneal, osseous throughout the axial and  proximal appendicular skeleton.  Patient and family face treatment option decisions, advanced directive decisions, end-of-life wishes, and anticipatory care needs.   Clinical Assessment and Goals of Care:   This NP Bianca Matthews reviewed medical records, received report from team, assessed the patient and then meet at the patient's bedside along with her daughter/ Bianca Matthews and son/Bianca Matthews  to discuss diagnosis, prognosis, GOC, EOL wishes disposition and options.  Concept of Hospice and Palliative Care were discussed  A  discussion was had today regarding advanced directives.  Concepts specific to code status, artifical feeding and hydration, continued IV antibiotics and rehospitalization was had.  The difference between a aggressive medical intervention path  and a palliative comfort care path for this patient at this time was had.  Values and goals of care important to patient and family were attempted to be elicited.  Natural trajectory and expectations at EOL were discussed.  Questions and concerns addressed.   Family encouraged to call with  questions or concerns.    PMT will continue to support holistically.   PATIENT currently is making her own medical decisions with the support of her family.    SUMMARY OF RECOMMENDATIONS    Code Status/Advance Care Planning:  DNR    Symptom Management:   Anxiety: Ativan 0.5 mg po every 8 hrs scheduled  Pain: Vicodin every 4 hours po as needed for pain  Palliative Prophylaxis:   Aspiration, Bowel Regimen, Frequent Pain Assessment and Oral Care  Additional Recommendations (Limitations, Scope, Preferences):  Patient and family are leaning towards a shift to full comfort.  They plan to process today's conversation, and talk as a  Family tonight, and then meet  me tomorrow morning at 10 AM to solidify goals of care.  Psycho-social/Spiritual:   Desire for further Chaplaincy support:yes  Additional Recommendations: Education on Hospice  Prognosis:   < 3 months  Discharge Planning: To Be Determined      Primary Diagnoses: Present on Admission: . Hyponatremia   I have reviewed the medical record, interviewed the patient and family, and examined the patient. The following aspects are pertinent.  Past Medical History:  Diagnosis Date  . Allergy    sees Dr. [name]  . GERD (gastroesophageal reflux disease)   . Hyperlipidemia   . Hypertension   . Varicose veins  Social History   Socioeconomic History  . Marital status: Widowed    Spouse name: None  . Number of children: None  . Years of education: None  . Highest education level: None  Social Needs  . Financial resource strain: None  . Food insecurity - worry: None  . Food insecurity - inability: None  . Transportation needs - medical: None  . Transportation needs - non-medical: None  Occupational History  . Occupation: retired  Tobacco Use  . Smoking status: Former Smoker    Packs/day: 0.50    Years: 10.00    Pack years: 5.00  . Smokeless tobacco: Never Used  Substance and Sexual Activity  .  Alcohol use: No    Alcohol/week: 0.0 oz  . Drug use: No  . Sexual activity: None  Other Topics Concern  . None  Social History Narrative   Retired   Married         Family History  Problem Relation Age of Onset  . Hypertension Unknown   . Heart disease Unknown    Scheduled Meds: . atenolol  100 mg Oral BID  . enoxaparin (LOVENOX) injection  40 mg Subcutaneous QHS  . feeding supplement (PRO-STAT SUGAR FREE 64)  30 mL Oral BID  . LORazepam  0.5 mg Oral Q8H  . pantoprazole  40 mg Oral Daily   Continuous Infusions: . sodium chloride 75 mL/hr at 10/15/17 1228  . cefTRIAXone (ROCEPHIN)  IV Stopped (10/15/17 0053)   PRN Meds:.acetaminophen **OR** acetaminophen, feeding supplement (ENSURE ENLIVE), HYDROcodone-acetaminophen, polyethylene glycol, temazepam Medications Prior to Admission:  Prior to Admission medications   Medication Sig Start Date End Date Taking? Authorizing Provider  atenolol (TENORMIN) 100 MG tablet Take 100 mg by mouth 2 (two) times daily.    Yes [provider]  furosemide (LASIX) 40 MG tablet Take 1 tablet (40 mg total) by mouth 2 (two) times daily. 05/11/17  Yes Bianca Morale, MD  HYDROcodone-acetaminophen (NORCO/VICODIN) 5-325 MG tablet Take 1 tablet by mouth every 4 (four) hours as needed for moderate pain. 10/05/17  Yes Bianca Morale, MD  [facility] PAIN RELIEF PATCH EX) Apply 1 patch topically as needed (pain).   Yes [provider]  magnesium hydroxide (MILK OF MAGNESIA) 400 MG/5ML suspension Take 15 mLs by mouth daily as needed for mild constipation.   Yes [provider]  Menthol, Topical Analgesic, (BIOFREEZE ROLL-ON) 4 % GEL Apply 1 application topically as needed (pain).   Yes [provider]  omeprazole (PRILOSEC) 40 MG capsule TAKE 1 CAPSULE EVERY DAY 07/23/17  Yes Bianca Morale, MD  polyethylene glycol (MIRALAX / GLYCOLAX) packet Take 17 g by mouth daily as needed for mild constipation. 05/08/17  Yes Dessa Phi, DO  potassium chloride (KLOR-CON 10) 10 MEQ tablet Take 1 tablet (10 mEq total) by mouth 2 (two) times daily. 05/14/17  Yes Bianca Morale, MD  Probiotic Product (PROBIOTIC FORMULA PO) Take 1 tablet by mouth daily.    Yes [provider]  rosuvastatin (CRESTOR) 10 MG tablet TAKE 1 TABLET AT BEDTIME 04/06/17  Yes Bianca Morale, MD  temazepam (RESTORIL) 30 MG capsule Take 1 capsule (30 mg total) by mouth at bedtime as needed for sleep. 10/10/17  Yes Bianca Morale, MD  baclofen (LIORESAL) 10 MG tablet Take 1 tablet (10 mg total) by mouth every 8 (eight) hours as needed for muscle spasms (Pain). Patient not taking: Reported on 10/05/2017 10/03/17   Magnus Sinning, MD  No Known Allergies Review of Systems  Constitutional: Positive for fatigue and unexpected weight change.  Neurological: Positive for weakness.    Physical Exam  Constitutional: She appears cachectic. She appears ill. Nasal cannula in place.  Cardiovascular: Normal rate, regular rhythm and normal heart sounds.  Pulmonary/Chest: She has decreased breath sounds in the right lower field and the left lower field.  Neurological: She is alert.  Skin: Skin is warm and dry.  Fingertips on left hand cool and grayish    Vital Signs: BP (!) 104/48 (BP Location: Right Arm)   Pulse 79   Temp 98.1 F (36.7 C) (Oral)   Resp 18   Ht 5\' 4"  (1.626 m)   Wt 67.7 kg (149 lb 4 oz)   SpO2 96%   BMI 25.62 kg/m  Pain Assessment: 0-10   Pain Score: Asleep   SpO2: SpO2: 96 % O2 Device:SpO2: 96 % O2 Flow Rate: .   IO: Intake/output summary:   Intake/Output Summary (Last 24 hours) at 10/15/2017 1447 Last data filed at 10/15/2017 1400 Gross per 24 hour  Intake 1678.75 ml  Output 2050 ml  Net -371.25 ml    LBM: Last BM Date: 10/13/17 Baseline Weight: Weight: 68 kg (149 lb 14.6 oz) Most recent weight: Weight: 67.7 kg (149 lb 4 oz)     Palliative Assessment/Data: 30   %  At best   Discussed with case management and Dr  Starla Link  Time In: 1415 Time Out: 1530 Time Total: 75 minutes Greater than 50%  of this time was spent counseling and coordinating care related to the above assessment and plan.  Signed by: Bianca Lessen, NP   Please contact Palliative Medicine Team phone at [phone] for questions and concerns.  For individual provider: See Shea Evans

## 2017-10-15 NOTE — Progress Notes (Signed)
Bianca Matthews   DOB:12-20-1927   FE#:[id number]   ITG#:[id number]  Subjective: Bianca Matthews is an 84 year old woman with PMH of HTN, hyperlipidemia who was admitted due to increasing confusion, weakness, and urinary retention.  She has had a progressive weight loss of 40 pounds, and has metastatic cancer, likely lung primary as seen on most recent CT scans from 10/14/2017.  I spoke with the patient's daughter Bianca Matthews and son Bianca Matthews to get most of the information.  They tell me that she has been seeing physiatry for back pain, however she underwent scans that indicated that there was cancer in her bones.  She was undergoing a work up to find the primary lesion, however she progressively worsened over the weekend, prompting them to bring her into the Emergency Room.  Her appetite is very low, and she has become increasingly immobile.  Bianca Matthews tells me that she is in pain.  The pain is in her back, and she isn't sure when she had pain medicine last.  She also tells me that she knows she has cancer in her bones.    Objective:  Vitals:   10/14/17 2248 10/15/17 0512  BP: (!) 132/56 (!) 104/48  Pulse: 88 79  Resp: 18 18  Temp: 98.4 F (36.9 C) 98.1 F (36.7 C)  SpO2: 97% 96%    Body mass index is 25.62 kg/m.  Intake/Output Summary (Last 24 hours) at 10/15/2017 1300 Last data filed at 10/15/2017 8309 Gross per 24 hour  Intake 1095 ml  Output 2050 ml  Net -955 ml     Sclerae anicteric  Oropharynx shows no thrush or other lesions  No cervical or supraclavicular adenopathy  Lungs no rales or wheezes--auscultated anterolaterally  Heart regular rate and rhythm  Abdomen soft, +BS  Neuro nonfocal  Extremities: left hand with some purpura in the fingertips (improved per daughter)  Throughout the conversation with Bianca Matthews she drifted off to sleep a few times.    CBG (last 3)  No results for input(s): GLUCAP in the last 72 hours.   Labs:  Lab Results  Component Value Date   WBC 8.1 10/15/2017   HGB 9.4 (L)  10/15/2017   HCT 29.7 (L) 10/15/2017   MCV 82.5 10/15/2017   PLT 301 10/15/2017   NEUTROABS 6.5 10/05/2017      Urine Studies No results for input(s): UHGB, CRYS in the last 72 hours.  Invalid input(s): UACOL, UAPR, USPG, UPH, UTP, UGL, UKET, UBIL, UNIT, UROB, ULEU, UEPI, UWBC, URBC, UBAC, CAST, UCOM, BILUA  Basic Metabolic Panel: Recent Labs  Lab 10/14/17 1643 10/15/17 0528  NA 129* 134*  K 4.1 3.6  CL 91* 97*  CO2 30 27  GLUCOSE 102* 99  BUN 23* 17  CREATININE 0.68 0.57  CALCIUM 8.9 8.5*   GFR Estimated Creatinine Clearance: 46.8 mL/min (by C-G formula based on SCr of 0.57 mg/dL). Liver Function Tests: Recent Labs  Lab 10/14/17 1643 10/15/17 0528  AST 27 19  ALT 15 12*  ALKPHOS 200* 165*  BILITOT 0.6 0.4  PROT 7.4 6.2*  ALBUMIN 2.8* 2.3*   No results for input(s): LIPASE, AMYLASE in the last 168 hours. No results for input(s): AMMONIA in the last 168 hours. Coagulation profile No results for input(s): INR, PROTIME in the last 168 hours.  CBC: Recent Labs  Lab 10/14/17 1643 10/15/17 0528  WBC 8.7 8.1  HGB 10.3* 9.4*  HCT 32.2* 29.7*  MCV 81.9 82.5  PLT 322 301   Cardiac Enzymes: No results  for input(s): CKTOTAL, CKMB, CKMBINDEX, TROPONINI in the last 168 hours. BNP: Invalid input(s): POCBNP CBG: No results for input(s): GLUCAP in the last 168 hours. D-Dimer No results for input(s): DDIMER in the last 72 hours. Hgb A1c No results for input(s): HGBA1C in the last 72 hours. Lipid Profile No results for input(s): CHOL, HDL, LDLCALC, TRIG, CHOLHDL, LDLDIRECT in the last 72 hours. Thyroid function studies Recent Labs    10/15/17 0528  TSH 2.495   Anemia work up Recent Labs    10/15/17 0528  VITAMINB12 972*  FERRITIN 255  TIBC 210*  IRON 19*     Studies:  Dg Chest 2 View  Result Date: 10/14/2017 CLINICAL DATA:  84 y/o F; weakness, confusion, and loss of appetite for 2 weeks. EXAM: CHEST  2 VIEW COMPARISON:  05/05/2017 chest  radiograph. FINDINGS: Normal cardiac silhouette. Aortic atherosclerosis with calcification. Pulmonary vascular congestion. Elevated right hemidiaphragm. No pleural effusion or pneumothorax. Mild interval anterior compression deformity of lower thoracic, probably T11 vertebral body. IMPRESSION: Pulmonary vascular congestion. Elevated right hemidiaphragm. Interval lower thoracic mild anterior compression deformity, probably T11. Electronically Signed   By: Kristine Garbe M.D.   On: 10/14/2017 18:04   Ct Head W & Wo Contrast  Result Date: 10/14/2017 CLINICAL DATA:  84 y/o F; generalized weakness and decreased appetite for 2 weeks. Pain all over body. EXAM: CT HEAD WITHOUT AND WITH CONTRAST TECHNIQUE: Contiguous axial images were obtained from the base of the skull through the vertex without and with intravenous contrast CONTRAST:  100 cc Isovue-300 COMPARISON:  05/05/2017 CT head. FINDINGS: Brain: No evidence of acute infarction, hemorrhage, hydrocephalus, extra-axial collection or mass lesion/mass effect. Interval small chronic infarction within the left inferior cerebellum and progression of moderate chronic microvascular ischemic changes and parenchymal volume loss of the brain. 5 mm enhancing foci in the left paramedian parietal lobe and left temporal lobe (series 12 image 12 and 22). There several additional faint cortical foci of enhancement in the right frontal lobe (series 11, image 25 and 26). Vascular: No hyperdense vessel or unexpected calcification. Visible vessels are patent. Skull: Normal. Negative for fracture or focal lesion. Sinuses/Orbits: No acute finding. Other: None. IMPRESSION: 1. Two subcentimeter enhancing lesions in left paramedian parietal and left temporal lobes as well as few punctate foci in right frontal lobe cortex likely representing metastatic disease. 2. No evidence of acute infarction, hemorrhage, or mass effect. 3. Progression of moderate chronic microvascular ischemic  changes and parenchymal volume loss of the brain. Electronically Signed   By: Kristine Garbe M.D.   On: 10/14/2017 21:43   Ct Chest W Contrast  Result Date: 10/14/2017 CLINICAL DATA:  Generalized weakness. Anorexia. Diffuse pain. Weight loss. New suspected bone metastases identified on recent lumbar spine CT and bone scintigraphy studies. EXAM: CT CHEST, ABDOMEN, AND PELVIS WITH CONTRAST TECHNIQUE: Multidetector CT imaging of the chest, abdomen and pelvis was performed following the standard protocol during bolus administration of intravenous contrast. CONTRAST:  118m ISOVUE-300 IOPAMIDOL (ISOVUE-300) INJECTION 61% COMPARISON:  10/01/2017 lumbar spine CT. 06/13/2005 CT abdomen/pelvis. 10/12/2017 bone scintigraphy study. FINDINGS: CT CHEST FINDINGS Cardiovascular: Normal heart size. No significant pericardial fluid/thickening. Right coronary atherosclerosis. Atherosclerotic nonaneurysmal thoracic aorta. Top-normal caliber main pulmonary artery (3.0 cm diameter). No central pulmonary emboli. Mediastinum/Nodes: No discrete thyroid nodules. Unremarkable esophagus. No axillary adenopathy. Enlarged 2.6 cm right anterior mediastinal prevascular node (series 2/image 26). Enlarged 2.0 cm right hilar node (series 2/image 25). No left hilar adenopathy. Lungs/Pleura: No pneumothorax. No pleural effusion. Moderate  centrilobular emphysema with diffuse bronchial wall thickening. There is a slightly spiculated 5.1 x 4.5 cm medial segment right middle lobe lung mass occluding the right middle lobe bronchi, with central necrosis. Subsegmental scarring versus atelectasis in basilar left lower lobe. Solid 4 mm medial right upper lobe pulmonary nodule (series 9/image 46). No additional significant pulmonary nodules. Musculoskeletal: Several mildly expansile sclerotic osseous lesions are noted throughout the bilateral ribs, for example in the anterior right second rib, posterior right ninth rib and posterior left seventh  rib. Patchy sclerotic lesions are noted throughout the thoracic spine and sternum. Mild pathologic fractures of T10 and T11 vertebral bodies appear slightly worsened since 10/01/2017. Moderate thoracic spondylosis. CT ABDOMEN PELVIS FINDINGS Hepatobiliary: Normal liver size. A few small scattered simple liver cyst measuring up to 1.4 cm in the left liver lobe. At least 2 additional subcentimeter scattered hypodense liver lesions (series 2/image 33 and image 43) are too small to characterize and were not definitely seen on 06/13/2005 CT abdomen study. Normal gallbladder with no radiopaque cholelithiasis. No biliary ductal dilatation. Pancreas: Normal, with no mass or duct dilation. Spleen: Normal size. No mass. Adrenals/Urinary Tract: Right adrenal 2.9 cm mass (series 2/image 47) with density 61 HU, not appreciably changed since 09/12 6 CT, most compatible with a benign adenoma. Left adrenal 1.4 cm nodule with density 61 HU, not appreciably changed since 06/13/2005 CT abdomen study, most compatible with a benign adenoma. Hydronephrosis. Simple 1.1 cm renal cyst in the anteromedial lower left kidney. No additional renal lesions. Bladder is completely collapsed by indwelling Foley catheter. Gas in the nondependent bladder is compatible with instrumentation. Stomach/Bowel: Normal non-distended stomach. Normal caliber small bowel with no small bowel wall thickening. Appendectomy. Normal large bowel with no diverticulosis, large bowel wall thickening or pericolonic fat stranding. Vascular/Lymphatic: Atherosclerotic nonaneurysmal abdominal aorta. Patent portal, splenic and renal veins. Mildly enlarged 1.1 cm central mesenteric node (series 2/image 68), new since 2006 CT. No additional pathologically enlarged abdominopelvic nodes. Reproductive: Coarsely calcified subcentimeter right uterine fibroid. No adnexal masses. Other: No pneumoperitoneum, ascites or focal fluid collection. There is a 1.0 cm soft tissue nodule  abutting anterior margin of the lateral segment left liver lobe (series 2/image 53), new since 06/13/2005 CT. Musculoskeletal: Patchy confluent mildly expansile sclerotic osseous lesions throughout the lumbar spine and bilateral pelvic girdle including the bilateral proximal femora, all new since 06/13/2005 CT. Moderate lumbar spondylosis. IMPRESSION: 1. Centrally necrotic slightly spiculated 5.1 cm medial segment right middle lobe lung mass, most compatible with primary bronchogenic carcinoma given the smoking related changes in the lungs. Tiny right upper lobe pulmonary nodule, cannot exclude ipsilateral pulmonary metastasis. 2. Right hilar and prevascular right anterior mediastinal adenopathy. Central mesenteric adenopathy. Suspect nodal metastatic disease. 3. Small soft tissue nodule abutting the anterior margin of the lateral segment left liver lobe. Suspect peritoneal metastasis. 4. Widespread patchy confluent sclerotic osseous metastatic disease throughout the axial and proximal appendicular skeleton. Involvement of the proximal femora bilaterally, predisposing to pathologic fractures in these locations. Subacute pathologic T10 and T11 vertebral compression fractures are minimally worsened since 10/01/2017 lumbar spine CT. 5. Aortic Atherosclerosis (ICD10-I70.0) and Emphysema (ICD10-J43.9). Right coronary atherosclerosis. 6. Bilateral adrenal adenomas. Electronically Signed   By: Ilona Sorrel M.D.   On: 10/14/2017 22:11   Ct Abdomen Pelvis W Contrast  Result Date: 10/14/2017 CLINICAL DATA:  Generalized weakness. Anorexia. Diffuse pain. Weight loss. New suspected bone metastases identified on recent lumbar spine CT and bone scintigraphy studies. EXAM: CT CHEST, ABDOMEN, AND PELVIS WITH  CONTRAST TECHNIQUE: Multidetector CT imaging of the chest, abdomen and pelvis was performed following the standard protocol during bolus administration of intravenous contrast. CONTRAST:  169m ISOVUE-300 IOPAMIDOL  (ISOVUE-300) INJECTION 61% COMPARISON:  10/01/2017 lumbar spine CT. 06/13/2005 CT abdomen/pelvis. 10/12/2017 bone scintigraphy study. FINDINGS: CT CHEST FINDINGS Cardiovascular: Normal heart size. No significant pericardial fluid/thickening. Right coronary atherosclerosis. Atherosclerotic nonaneurysmal thoracic aorta. Top-normal caliber main pulmonary artery (3.0 cm diameter). No central pulmonary emboli. Mediastinum/Nodes: No discrete thyroid nodules. Unremarkable esophagus. No axillary adenopathy. Enlarged 2.6 cm right anterior mediastinal prevascular node (series 2/image 26). Enlarged 2.0 cm right hilar node (series 2/image 25). No left hilar adenopathy. Lungs/Pleura: No pneumothorax. No pleural effusion. Moderate centrilobular emphysema with diffuse bronchial wall thickening. There is a slightly spiculated 5.1 x 4.5 cm medial segment right middle lobe lung mass occluding the right middle lobe bronchi, with central necrosis. Subsegmental scarring versus atelectasis in basilar left lower lobe. Solid 4 mm medial right upper lobe pulmonary nodule (series 9/image 46). No additional significant pulmonary nodules. Musculoskeletal: Several mildly expansile sclerotic osseous lesions are noted throughout the bilateral ribs, for example in the anterior right second rib, posterior right ninth rib and posterior left seventh rib. Patchy sclerotic lesions are noted throughout the thoracic spine and sternum. Mild pathologic fractures of T10 and T11 vertebral bodies appear slightly worsened since 10/01/2017. Moderate thoracic spondylosis. CT ABDOMEN PELVIS FINDINGS Hepatobiliary: Normal liver size. A few small scattered simple liver cyst measuring up to 1.4 cm in the left liver lobe. At least 2 additional subcentimeter scattered hypodense liver lesions (series 2/image 33 and image 43) are too small to characterize and were not definitely seen on 06/13/2005 CT abdomen study. Normal gallbladder with no radiopaque cholelithiasis.  No biliary ductal dilatation. Pancreas: Normal, with no mass or duct dilation. Spleen: Normal size. No mass. Adrenals/Urinary Tract: Right adrenal 2.9 cm mass (series 2/image 47) with density 61 HU, not appreciably changed since 09/12 6 CT, most compatible with a benign adenoma. Left adrenal 1.4 cm nodule with density 61 HU, not appreciably changed since 06/13/2005 CT abdomen study, most compatible with a benign adenoma. Hydronephrosis. Simple 1.1 cm renal cyst in the anteromedial lower left kidney. No additional renal lesions. Bladder is completely collapsed by indwelling Foley catheter. Gas in the nondependent bladder is compatible with instrumentation. Stomach/Bowel: Normal non-distended stomach. Normal caliber small bowel with no small bowel wall thickening. Appendectomy. Normal large bowel with no diverticulosis, large bowel wall thickening or pericolonic fat stranding. Vascular/Lymphatic: Atherosclerotic nonaneurysmal abdominal aorta. Patent portal, splenic and renal veins. Mildly enlarged 1.1 cm central mesenteric node (series 2/image 68), new since 2006 CT. No additional pathologically enlarged abdominopelvic nodes. Reproductive: Coarsely calcified subcentimeter right uterine fibroid. No adnexal masses. Other: No pneumoperitoneum, ascites or focal fluid collection. There is a 1.0 cm soft tissue nodule abutting anterior margin of the lateral segment left liver lobe (series 2/image 53), new since 06/13/2005 CT. Musculoskeletal: Patchy confluent mildly expansile sclerotic osseous lesions throughout the lumbar spine and bilateral pelvic girdle including the bilateral proximal femora, all new since 06/13/2005 CT. Moderate lumbar spondylosis. IMPRESSION: 1. Centrally necrotic slightly spiculated 5.1 cm medial segment right middle lobe lung mass, most compatible with primary bronchogenic carcinoma given the smoking related changes in the lungs. Tiny right upper lobe pulmonary nodule, cannot exclude ipsilateral  pulmonary metastasis. 2. Right hilar and prevascular right anterior mediastinal adenopathy. Central mesenteric adenopathy. Suspect nodal metastatic disease. 3. Small soft tissue nodule abutting the anterior margin of the lateral segment left liver lobe.  Suspect peritoneal metastasis. 4. Widespread patchy confluent sclerotic osseous metastatic disease throughout the axial and proximal appendicular skeleton. Involvement of the proximal femora bilaterally, predisposing to pathologic fractures in these locations. Subacute pathologic T10 and T11 vertebral compression fractures are minimally worsened since 10/01/2017 lumbar spine CT. 5. Aortic Atherosclerosis (ICD10-I70.0) and Emphysema (ICD10-J43.9). Right coronary atherosclerosis. 6. Bilateral adrenal adenomas. Electronically Signed   By: Ilona Sorrel M.D.   On: 10/14/2017 22:11    Assessment/Plan: 84 y.o. with newly diagnosed metastatic likely lung cancer on CT chest/abdomen/pelvis from 10/14/17.  1. Metastatic Cancer: Likely lung primary.  Discussed with family and reviewed scan results in detail.  Given low performance status, advanced age, treatment is not an option at this time.  Would not biopsy at this point.  Family is in agreement.  I reviewed the cancer in the lung with the patient and she closed her eyes for about 15 seconds and then stopped talking to me.  Pending brain MRI for further eval of brain mets given cognition changes.  Should she have lesions in the brain she may benefit from steroids or radiation.  Will await the results and discuss with family.  We discussed palliative care consult, and pursing hospice.  Bianca Matthews and Bianca Matthews tell me their dad, Bianca Matthews's late husband passed away from lung cancer, and they don't want her to suffer through treatment like he did.    2.  Pain: From metastatic cancer.  Patient has PRN pain meds.  I spoke to the nurse about getting her some medication after I met with her.  She may benefit from long acting depending  on her need for her PRN Vicodin.  She is tolerating these well with minimal constipation, her last BM was yesterday.  3. Severe protein calorie malnutrition: The Dietician has consulted and ensure and prostat was recommended.  Encouraged PO intake.  Weight loss in the setting of metastatic cancer.    Code Status: DNR    Scot Dock, NP 10/15/2017  1:00 PM Medical Oncology and Hematology [facility] [address] [place], [place] [address] Tel. [phone]    Fax. [phone]'  Attending Note  I personally saw the patient, reviewed the chart and examined the patient. The plan of care was discussed with the patient and her family. I agree with the assessment and plan as documented above. Thank you very much for the consultation. In summary patient most likely has metastatic lung cancer with extensive bone involvement causing compression fractures and pain in the back.  I discussed with the patient and her family about different treatment options including surgical, radiation and medication.  They are keen on medication made pain management rather than any aggressive interventions. I recommended hospice care and they are willing and accepting of that.  There is no need to perform any biopsies. Thank you very much for consulting [place].

## 2017-10-15 NOTE — Progress Notes (Signed)
Patient ID: Sreenidhi Ganson, female   DOB: [birth year], 84 y.o.   MRN: [id number]  PROGRESS NOTE    Amaria Mundorf  UXN:[id number] DOB: 1927/12/22 DOA: 10/14/2017 PCP: Laurey Morale, MD   Brief Narrative:  84 year old female with history of hypertension, hyperlipidemia, GERD, recent weight loss of 40 pounds since August of last year, recent diagnosis of numerous sclerotic lesions throughout her lumbar spine and ribs which were worrisome for blastic metastases presented with confusion along with generalized weakness and decreased oral intake.  She was admitted with hyponatremia and UTI and started on intravenous fluids and antibiotics.   Assessment & Plan:   Active Problems:   Hyponatremia   Hypoxia   Anemia   Compression fracture of body of thoracic vertebra (HCC)   Bony metastasis (HCC)    Hyponatremia -Probably from poor oral intake and dehydration.  Improving with IV fluids.  Repeat a.m. labs -Continue IV fluids  UTI -Follow cultures.  Continue Rocephin  New diagnosis of probable right-sided lung cancer with probable brain, peritoneal and bony metastases with pathologic thoracic vertebral fractures -Spoke to Dr. Feng/oncology who requested MRI of the brain with and without contrast.  Patient will be evaluated by Dr. Julien Nordmann as per Dr. Burr Medico. -Overall prognosis is guarded to poor.  Patient will probably benefit from hospice evaluation.  Consult palliative care  Severe protein calorie malnutrition along with weight loss probably secondary to metastatic cancer  -Nutrition consult.    Pathologic thoracic vertebral fractures -PT evaluation.  Await oncology/palliative care evaluation  Anemia -Probably chronic anemia from metastatic cancer -Monitor   DVT prophylaxis: Lovenox Code Status: DNR Family Communication: None at bedside Disposition Plan: Depends on clinical outcome  Consultants: Oncology  Procedures: None  Antimicrobials: Rocephin from 10/14/2017  onwards   Subjective: Patient seen and examined at bedside.  She is awake but slow to respond to questions.  Feels weak.  No overnight fever or vomiting.  Objective: Vitals:   10/14/17 2030 10/14/17 2100 10/14/17 2248 10/15/17 0512  BP: 137/77 114/79 (!) 132/56 (!) 104/48  Pulse: 79 85 88 79  Resp:   18 18  Temp:   98.4 F (36.9 C) 98.1 F (36.7 C)  TempSrc:   Oral Oral  SpO2: 100% 99% 97% 96%  Weight:   68 kg (149 lb 14.6 oz) 67.7 kg (149 lb 4 oz)  Height:   5\' 4"  (1.626 m)     Intake/Output Summary (Last 24 hours) at 10/15/2017 1317 Last data filed at 10/15/2017 2542 Gross per 24 hour  Intake 1095 ml  Output 2050 ml  Net -955 ml   Filed Weights   10/14/17 2248 10/15/17 0512  Weight: 68 kg (149 lb 14.6 oz) 67.7 kg (149 lb 4 oz)    Examination:  General exam: Appears calm and comfortable.  No acute distress Respiratory system: Bilateral decreased breath sound at bases Cardiovascular system: S1 & S2 heard, rate controlled gastrointestinal system: Abdomen is nondistended, soft and nontender. Normal bowel sounds heard. Central nervous system: Alert and oriented. No focal neurological deficits. Moving extremities Extremities: No cyanosis, clubbing, edema  Skin: No rashes, lesions or ulcers Lymph: No cervical lymphadenopathy    Data Reviewed: I have personally reviewed following labs and imaging studies  CBC: Recent Labs  Lab 10/14/17 1643 10/15/17 0528  WBC 8.7 8.1  HGB 10.3* 9.4*  HCT 32.2* 29.7*  MCV 81.9 82.5  PLT 322 706   Basic Metabolic Panel: Recent Labs  Lab 10/14/17 1643 10/15/17 0528  NA 129* 134*  K 4.1 3.6  CL 91* 97*  CO2 30 27  GLUCOSE 102* 99  BUN 23* 17  CREATININE 0.68 0.57  CALCIUM 8.9 8.5*   GFR: Estimated Creatinine Clearance: 46.8 mL/min (by C-G formula based on SCr of 0.57 mg/dL). Liver Function Tests: Recent Labs  Lab 10/14/17 1643 10/15/17 0528  AST 27 19  ALT 15 12*  ALKPHOS 200* 165*  BILITOT 0.6 0.4  PROT 7.4  6.2*  ALBUMIN 2.8* 2.3*   No results for input(s): LIPASE, AMYLASE in the last 168 hours. No results for input(s): AMMONIA in the last 168 hours. Coagulation Profile: No results for input(s): INR, PROTIME in the last 168 hours. Cardiac Enzymes: No results for input(s): CKTOTAL, CKMB, CKMBINDEX, TROPONINI in the last 168 hours. BNP (last 3 results) Recent Labs    11/03/16 1302  PROBNP 239.0*   HbA1C: No results for input(s): HGBA1C in the last 72 hours. CBG: No results for input(s): GLUCAP in the last 168 hours. Lipid Profile: No results for input(s): CHOL, HDL, LDLCALC, TRIG, CHOLHDL, LDLDIRECT in the last 72 hours. Thyroid Function Tests: Recent Labs    10/15/17 0528  TSH 2.495   Anemia Panel: Recent Labs    10/15/17 0528  VITAMINB12 972*  FERRITIN 255  TIBC 210*  IRON 19*   Sepsis Labs: No results for input(s): PROCALCITON, LATICACIDVEN in the last 168 hours.  No results found for this or any previous visit (from the past 240 hour(s)).       Radiology Studies: Dg Chest 2 View  Result Date: 10/14/2017 CLINICAL DATA:  84 y/o F; weakness, confusion, and loss of appetite for 2 weeks. EXAM: CHEST  2 VIEW COMPARISON:  05/05/2017 chest radiograph. FINDINGS: Normal cardiac silhouette. Aortic atherosclerosis with calcification. Pulmonary vascular congestion. Elevated right hemidiaphragm. No pleural effusion or pneumothorax. Mild interval anterior compression deformity of lower thoracic, probably T11 vertebral body. IMPRESSION: Pulmonary vascular congestion. Elevated right hemidiaphragm. Interval lower thoracic mild anterior compression deformity, probably T11. Electronically Signed   By: Kristine Garbe M.D.   On: 10/14/2017 18:04   Ct Head W & Wo Contrast  Result Date: 10/14/2017 CLINICAL DATA:  84 y/o F; generalized weakness and decreased appetite for 2 weeks. Pain all over body. EXAM: CT HEAD WITHOUT AND WITH CONTRAST TECHNIQUE: Contiguous axial images were  obtained from the base of the skull through the vertex without and with intravenous contrast CONTRAST:  100 cc Isovue-300 COMPARISON:  05/05/2017 CT head. FINDINGS: Brain: No evidence of acute infarction, hemorrhage, hydrocephalus, extra-axial collection or mass lesion/mass effect. Interval small chronic infarction within the left inferior cerebellum and progression of moderate chronic microvascular ischemic changes and parenchymal volume loss of the brain. 5 mm enhancing foci in the left paramedian parietal lobe and left temporal lobe (series 12 image 12 and 22). There several additional faint cortical foci of enhancement in the right frontal lobe (series 11, image 25 and 26). Vascular: No hyperdense vessel or unexpected calcification. Visible vessels are patent. Skull: Normal. Negative for fracture or focal lesion. Sinuses/Orbits: No acute finding. Other: None. IMPRESSION: 1. Two subcentimeter enhancing lesions in left paramedian parietal and left temporal lobes as well as few punctate foci in right frontal lobe cortex likely representing metastatic disease. 2. No evidence of acute infarction, hemorrhage, or mass effect. 3. Progression of moderate chronic microvascular ischemic changes and parenchymal volume loss of the brain. Electronically Signed   By: Kristine Garbe M.D.   On: 10/14/2017 21:43   Ct Chest W Contrast  Result Date: 10/14/2017 CLINICAL DATA:  Generalized weakness. Anorexia. Diffuse pain. Weight loss. New suspected bone metastases identified on recent lumbar spine CT and bone scintigraphy studies. EXAM: CT CHEST, ABDOMEN, AND PELVIS WITH CONTRAST TECHNIQUE: Multidetector CT imaging of the chest, abdomen and pelvis was performed following the standard protocol during bolus administration of intravenous contrast. CONTRAST:  173mL ISOVUE-300 IOPAMIDOL (ISOVUE-300) INJECTION 61% COMPARISON:  10/01/2017 lumbar spine CT. 06/13/2005 CT abdomen/pelvis. 10/12/2017 bone scintigraphy study.  FINDINGS: CT CHEST FINDINGS Cardiovascular: Normal heart size. No significant pericardial fluid/thickening. Right coronary atherosclerosis. Atherosclerotic nonaneurysmal thoracic aorta. Top-normal caliber main pulmonary artery (3.0 cm diameter). No central pulmonary emboli. Mediastinum/Nodes: No discrete thyroid nodules. Unremarkable esophagus. No axillary adenopathy. Enlarged 2.6 cm right anterior mediastinal prevascular node (series 2/image 26). Enlarged 2.0 cm right hilar node (series 2/image 25). No left hilar adenopathy. Lungs/Pleura: No pneumothorax. No pleural effusion. Moderate centrilobular emphysema with diffuse bronchial wall thickening. There is a slightly spiculated 5.1 x 4.5 cm medial segment right middle lobe lung mass occluding the right middle lobe bronchi, with central necrosis. Subsegmental scarring versus atelectasis in basilar left lower lobe. Solid 4 mm medial right upper lobe pulmonary nodule (series 9/image 46). No additional significant pulmonary nodules. Musculoskeletal: Several mildly expansile sclerotic osseous lesions are noted throughout the bilateral ribs, for example in the anterior right second rib, posterior right ninth rib and posterior left seventh rib. Patchy sclerotic lesions are noted throughout the thoracic spine and sternum. Mild pathologic fractures of T10 and T11 vertebral bodies appear slightly worsened since 10/01/2017. Moderate thoracic spondylosis. CT ABDOMEN PELVIS FINDINGS Hepatobiliary: Normal liver size. A few small scattered simple liver cyst measuring up to 1.4 cm in the left liver lobe. At least 2 additional subcentimeter scattered hypodense liver lesions (series 2/image 33 and image 43) are too small to characterize and were not definitely seen on 06/13/2005 CT abdomen study. Normal gallbladder with no radiopaque cholelithiasis. No biliary ductal dilatation. Pancreas: Normal, with no mass or duct dilation. Spleen: Normal size. No mass. Adrenals/Urinary Tract:  Right adrenal 2.9 cm mass (series 2/image 47) with density 61 HU, not appreciably changed since 09/12 6 CT, most compatible with a benign adenoma. Left adrenal 1.4 cm nodule with density 61 HU, not appreciably changed since 06/13/2005 CT abdomen study, most compatible with a benign adenoma. Hydronephrosis. Simple 1.1 cm renal cyst in the anteromedial lower left kidney. No additional renal lesions. Bladder is completely collapsed by indwelling Foley catheter. Gas in the nondependent bladder is compatible with instrumentation. Stomach/Bowel: Normal non-distended stomach. Normal caliber small bowel with no small bowel wall thickening. Appendectomy. Normal large bowel with no diverticulosis, large bowel wall thickening or pericolonic fat stranding. Vascular/Lymphatic: Atherosclerotic nonaneurysmal abdominal aorta. Patent portal, splenic and renal veins. Mildly enlarged 1.1 cm central mesenteric node (series 2/image 68), new since 2006 CT. No additional pathologically enlarged abdominopelvic nodes. Reproductive: Coarsely calcified subcentimeter right uterine fibroid. No adnexal masses. Other: No pneumoperitoneum, ascites or focal fluid collection. There is a 1.0 cm soft tissue nodule abutting anterior margin of the lateral segment left liver lobe (series 2/image 53), new since 06/13/2005 CT. Musculoskeletal: Patchy confluent mildly expansile sclerotic osseous lesions throughout the lumbar spine and bilateral pelvic girdle including the bilateral proximal femora, all new since 06/13/2005 CT. Moderate lumbar spondylosis. IMPRESSION: 1. Centrally necrotic slightly spiculated 5.1 cm medial segment right middle lobe lung mass, most compatible with primary bronchogenic carcinoma given the smoking related changes in the lungs. Tiny right upper lobe pulmonary nodule, cannot exclude ipsilateral pulmonary  metastasis. 2. Right hilar and prevascular right anterior mediastinal adenopathy. Central mesenteric adenopathy. Suspect nodal  metastatic disease. 3. Small soft tissue nodule abutting the anterior margin of the lateral segment left liver lobe. Suspect peritoneal metastasis. 4. Widespread patchy confluent sclerotic osseous metastatic disease throughout the axial and proximal appendicular skeleton. Involvement of the proximal femora bilaterally, predisposing to pathologic fractures in these locations. Subacute pathologic T10 and T11 vertebral compression fractures are minimally worsened since 10/01/2017 lumbar spine CT. 5. Aortic Atherosclerosis (ICD10-I70.0) and Emphysema (ICD10-J43.9). Right coronary atherosclerosis. 6. Bilateral adrenal adenomas. Electronically Signed   By: Ilona Sorrel M.D.   On: 10/14/2017 22:11   Ct Abdomen Pelvis W Contrast  Result Date: 10/14/2017 CLINICAL DATA:  Generalized weakness. Anorexia. Diffuse pain. Weight loss. New suspected bone metastases identified on recent lumbar spine CT and bone scintigraphy studies. EXAM: CT CHEST, ABDOMEN, AND PELVIS WITH CONTRAST TECHNIQUE: Multidetector CT imaging of the chest, abdomen and pelvis was performed following the standard protocol during bolus administration of intravenous contrast. CONTRAST:  174mL ISOVUE-300 IOPAMIDOL (ISOVUE-300) INJECTION 61% COMPARISON:  10/01/2017 lumbar spine CT. 06/13/2005 CT abdomen/pelvis. 10/12/2017 bone scintigraphy study. FINDINGS: CT CHEST FINDINGS Cardiovascular: Normal heart size. No significant pericardial fluid/thickening. Right coronary atherosclerosis. Atherosclerotic nonaneurysmal thoracic aorta. Top-normal caliber main pulmonary artery (3.0 cm diameter). No central pulmonary emboli. Mediastinum/Nodes: No discrete thyroid nodules. Unremarkable esophagus. No axillary adenopathy. Enlarged 2.6 cm right anterior mediastinal prevascular node (series 2/image 26). Enlarged 2.0 cm right hilar node (series 2/image 25). No left hilar adenopathy. Lungs/Pleura: No pneumothorax. No pleural effusion. Moderate centrilobular emphysema with  diffuse bronchial wall thickening. There is a slightly spiculated 5.1 x 4.5 cm medial segment right middle lobe lung mass occluding the right middle lobe bronchi, with central necrosis. Subsegmental scarring versus atelectasis in basilar left lower lobe. Solid 4 mm medial right upper lobe pulmonary nodule (series 9/image 46). No additional significant pulmonary nodules. Musculoskeletal: Several mildly expansile sclerotic osseous lesions are noted throughout the bilateral ribs, for example in the anterior right second rib, posterior right ninth rib and posterior left seventh rib. Patchy sclerotic lesions are noted throughout the thoracic spine and sternum. Mild pathologic fractures of T10 and T11 vertebral bodies appear slightly worsened since 10/01/2017. Moderate thoracic spondylosis. CT ABDOMEN PELVIS FINDINGS Hepatobiliary: Normal liver size. A few small scattered simple liver cyst measuring up to 1.4 cm in the left liver lobe. At least 2 additional subcentimeter scattered hypodense liver lesions (series 2/image 33 and image 43) are too small to characterize and were not definitely seen on 06/13/2005 CT abdomen study. Normal gallbladder with no radiopaque cholelithiasis. No biliary ductal dilatation. Pancreas: Normal, with no mass or duct dilation. Spleen: Normal size. No mass. Adrenals/Urinary Tract: Right adrenal 2.9 cm mass (series 2/image 47) with density 61 HU, not appreciably changed since 09/12 6 CT, most compatible with a benign adenoma. Left adrenal 1.4 cm nodule with density 61 HU, not appreciably changed since 06/13/2005 CT abdomen study, most compatible with a benign adenoma. Hydronephrosis. Simple 1.1 cm renal cyst in the anteromedial lower left kidney. No additional renal lesions. Bladder is completely collapsed by indwelling Foley catheter. Gas in the nondependent bladder is compatible with instrumentation. Stomach/Bowel: Normal non-distended stomach. Normal caliber small bowel with no small bowel  wall thickening. Appendectomy. Normal large bowel with no diverticulosis, large bowel wall thickening or pericolonic fat stranding. Vascular/Lymphatic: Atherosclerotic nonaneurysmal abdominal aorta. Patent portal, splenic and renal veins. Mildly enlarged 1.1 cm central mesenteric node (series 2/image 68), new since 2006 CT.  No additional pathologically enlarged abdominopelvic nodes. Reproductive: Coarsely calcified subcentimeter right uterine fibroid. No adnexal masses. Other: No pneumoperitoneum, ascites or focal fluid collection. There is a 1.0 cm soft tissue nodule abutting anterior margin of the lateral segment left liver lobe (series 2/image 53), new since 06/13/2005 CT. Musculoskeletal: Patchy confluent mildly expansile sclerotic osseous lesions throughout the lumbar spine and bilateral pelvic girdle including the bilateral proximal femora, all new since 06/13/2005 CT. Moderate lumbar spondylosis. IMPRESSION: 1. Centrally necrotic slightly spiculated 5.1 cm medial segment right middle lobe lung mass, most compatible with primary bronchogenic carcinoma given the smoking related changes in the lungs. Tiny right upper lobe pulmonary nodule, cannot exclude ipsilateral pulmonary metastasis. 2. Right hilar and prevascular right anterior mediastinal adenopathy. Central mesenteric adenopathy. Suspect nodal metastatic disease. 3. Small soft tissue nodule abutting the anterior margin of the lateral segment left liver lobe. Suspect peritoneal metastasis. 4. Widespread patchy confluent sclerotic osseous metastatic disease throughout the axial and proximal appendicular skeleton. Involvement of the proximal femora bilaterally, predisposing to pathologic fractures in these locations. Subacute pathologic T10 and T11 vertebral compression fractures are minimally worsened since 10/01/2017 lumbar spine CT. 5. Aortic Atherosclerosis (ICD10-I70.0) and Emphysema (ICD10-J43.9). Right coronary atherosclerosis. 6. Bilateral adrenal  adenomas. Electronically Signed   By: Ilona Sorrel M.D.   On: 10/14/2017 22:11        Scheduled Meds: . atenolol  100 mg Oral BID  . enoxaparin (LOVENOX) injection  40 mg Subcutaneous QHS  . feeding supplement (PRO-STAT SUGAR FREE 64)  30 mL Oral BID  . pantoprazole  40 mg Oral Daily  . rosuvastatin  10 mg Oral QHS   Continuous Infusions: . sodium chloride 75 mL/hr at 10/15/17 1228  . cefTRIAXone (ROCEPHIN)  IV Stopped (10/15/17 0053)     LOS: 1 day        Aline August, MD [facility] Pager [phone]  If 7PM-7AM, please contact night-coverage [email or web address] Password [email or web address] 10/15/2017, 1:17 PM

## 2017-10-15 NOTE — Progress Notes (Signed)
Pt received 1 tablet of Norco and then thirty minutes later she received 0.5mg  IV ativan for MRI. After returning from MRI, it was noted that her BP was low at 90/36 manually checked by RN. MD made aware of these findings, no new orders received at that time. Rechecked BP a few hours later and it was 92/48 manually checked by RN. Again, MD made aware of findings, no new orders received. Patient is asymptomatic. Will continue to monitor BP.   Bianca Matthews [facility] 10/15/2017 7:00 PM

## 2017-10-15 NOTE — Progress Notes (Signed)
Initial Nutrition Assessment  INTERVENTION:   -Provide Ensure Enlive po BID PRN, each supplement provides 350 kcal and 20 grams of protein -Continue Prostat liquid protein PO 30 ml BID with meals, each supplement provides 100 kcal, 15 grams protein.  -RD will continue to monitor  NUTRITION DIAGNOSIS:   Increased nutrient needs related to chronic illness, catabolic illness, cancer and cancer related treatments as evidenced by estimated needs.  GOAL:   Patient will meet greater than or equal to 90% of their needs  MONITOR:   PO intake, Supplement acceptance, Weight trends, Labs, I & O's  REASON FOR ASSESSMENT:   Malnutrition Screening Tool    ASSESSMENT:   84 y.o. female, w hypertension, hyperlipidemia, gerd, apparently has had 40 lbs of weight loss since August per her family.  Pt has recently been seen to have numerous sclerotic lesions throught the lumbar spine pelvis and ribs that were worrisome for blastic metastasis on CT scan 09/21/2017.   She apparently has had significant decline in the past 48 hours.  Slight confusion, and decrease in po intake and generalized weakness.   Pt in room with son at bedside. Pt HOH and couldn't answer RD's questions. A bit confused as well as she states she saw the "morning dietitian earlier today". Pt states she ate yesterday but this morning she has been grazing on honey nut cheerios. Per pt's son, pt has not been eating at all for the past 2 weeks, prior to that she was eating fairly well despite continued weight loss. Pt states she likes Ensure but prefers to not drink one now, will order PRN. Pt's son explains that pt has "cancer all over her body" and he would like pt to sit in chair so she can eat. Declined NFPE at this time. Noted that palliative care consult was placed for GOC. Will monitor for goals decisions.  Per chart review, pt has lost 48 lb since 10/17/16 (24% wt loss x 1 year, significant for time frame). Suspect some degree of  malnutrition but unable to diagnose without NFPE.  Medications reviewed.  Labs reviewed: Low Na   NUTRITION - FOCUSED PHYSICAL EXAM:  Family declined at this time.  Diet Order:  Diet regular Room service appropriate? Yes; Fluid consistency: Thin  EDUCATION NEEDS:   Not appropriate for education at this time  Skin:  Skin Assessment: Reviewed RN Assessment  Last BM:  1/12  Height:   Ht Readings from Last 1 Encounters:  10/14/17 5\' 4"  (1.626 m)    Weight:   Wt Readings from Last 1 Encounters:  10/15/17 149 lb 4 oz (67.7 kg)    Ideal Body Weight:  54.5 kg  BMI:  Body mass index is 25.62 kg/m.  Estimated Nutritional Needs:   Kcal:  1800-2000  Protein:  90-100g  Fluid:  2L/day  Clayton Bibles, MS, RD, LDN [place] [facility] Pager: [phone] After Hours Pager: [phone]

## 2017-10-16 ENCOUNTER — Telehealth: Payer: Self-pay | Admitting: Family Medicine

## 2017-10-16 LAB — CBC WITH DIFFERENTIAL/PLATELET
Basophils Absolute: 0 10*3/uL (ref 0.0–0.1)
Basophils Relative: 0 %
Eosinophils Absolute: 0 10*3/uL (ref 0.0–0.7)
Eosinophils Relative: 0 %
HCT: 25.7 % — ABNORMAL LOW (ref 36.0–46.0)
Hemoglobin: 8.2 g/dL — ABNORMAL LOW (ref 12.0–15.0)
Lymphocytes Relative: 7 %
Lymphs Abs: 0.7 10*3/uL (ref 0.7–4.0)
MCH: 26.6 pg (ref 26.0–34.0)
MCHC: 31.9 g/dL (ref 30.0–36.0)
MCV: 83.4 fL (ref 78.0–100.0)
Monocytes Absolute: 0.7 10*3/uL (ref 0.1–1.0)
Monocytes Relative: 8 %
Neutro Abs: 7.7 10*3/uL (ref 1.7–7.7)
Neutrophils Relative %: 85 %
Platelets: 237 10*3/uL (ref 150–400)
RBC: 3.08 MIL/uL — ABNORMAL LOW (ref 3.87–5.11)
RDW: 18.2 % — ABNORMAL HIGH (ref 11.5–15.5)
WBC: 9.1 10*3/uL (ref 4.0–10.5)

## 2017-10-16 LAB — FOLATE RBC
Folate, Hemolysate: 484.4 ng/mL
Folate, RBC: 1688 ng/mL (ref >498)
Hematocrit: 28.7 % — ABNORMAL LOW (ref 34.0–46.6)

## 2017-10-16 LAB — BASIC METABOLIC PANEL
Anion gap: 9 (ref 5–15)
BUN: 15 mg/dL (ref 6–20)
CO2: 26 mmol/L (ref 22–32)
Calcium: 8.6 mg/dL — ABNORMAL LOW (ref 8.9–10.3)
Chloride: 100 mmol/L — ABNORMAL LOW (ref 101–111)
Creatinine, Ser: 0.54 mg/dL (ref 0.44–1.00)
GFR calc Af Amer: >60 mL/min (ref >60)
GFR calc non Af Amer: >60 mL/min (ref >60)
Glucose, Bld: 107 mg/dL — ABNORMAL HIGH (ref 65–99)
Potassium: 3.5 mmol/L (ref 3.5–5.1)
Sodium: 135 mmol/L (ref 135–145)

## 2017-10-16 LAB — MAGNESIUM: Magnesium: 2.2 mg/dL (ref 1.7–2.4)

## 2017-10-16 NOTE — Care Management Note (Signed)
Case Management Note  Patient Details  Name: Bianca Matthews MRN: [id number] Date of Birth: 06-Jan-1928  Subjective/Objective: CM referral to offer home hospice choice. Deferred to son-Bianca Matthews-[phone]-chose [facility] aware. PCP-Dr. Alysia Penna.DME needed:hospital bed,gel mattress,w/c,3n1,overbed table. Will need ambulance [facility] transportation-forms in shadow chart. Confirmed address on face sheet. DNR form in shadow for MD to complete.                   Action/Plan:d/c home w/home hospice/PTAR   Expected Discharge Date:                  Expected Discharge Plan:  Home w Hospice Care  In-House Referral:     Discharge planning Services  CM Consult  Post Acute Care Choice:  Durable Medical Equipment(rw) Choice offered to:  Adult Children  DME Arranged:    DME Agency:     HH Arranged:  RN [place] Agency:  [facility]  Status of Service:  Completed, signed off  If discussed at [place] of Stay Meetings, dates discussed:    Additional Comments:  Dessa Phi, RN 10/16/2017, 3:42 PM

## 2017-10-16 NOTE — Plan of Care (Signed)
  Nutrition: Adequate nutrition will be maintained 10/16/2017 2038 - Progressing by Ashley Murrain, RN

## 2017-10-16 NOTE — Progress Notes (Signed)
Assumed care of patient at this time. Patient is stable with no complaints at this time. Agree with previously documented assessment. Will continue to monitor patient.   

## 2017-10-16 NOTE — Progress Notes (Signed)
   10/16/17 1000  Clinical Encounter Type  Visited With Patient and family together;Health care provider  Visit Type Initial  Referral From Nurse  Consult/Referral To Chaplain   Responded to a [facility] for a request for a [place] from [place].  Family and Healthcare providers in the room, patient indicated a visitor from [place] had already come.  Let them know if we can be of any assistance to let us know. Will follow as needed.  Chaplain Katherene Ponto

## 2017-10-16 NOTE — Progress Notes (Signed)
OT Cancellation Note  Patient Details Name: Bianca Matthews MRN: [id number] DOB: 1928/05/06   Cancelled Treatment:    Reason Eval/Treat Not Completed: Other (comment).  Noted palliative notes:  Family is leaning towards comfort care; pt with prognosis of <3 months. Will sign off.  Dequavious Harshberger 10/16/2017, 7:58 AM  Lesle Chris, OTR/L [phone] 10/16/2017

## 2017-10-16 NOTE — Telephone Encounter (Signed)
Called Delsa Sale to inform her that YES Dr. Sarajane Jews will be the attending physician for the pt.

## 2017-10-16 NOTE — Progress Notes (Signed)
Patient ID: Bianca Matthews, female   DOB: Jul 01, 1928, 84 y.o.   MRN: [id number]  PROGRESS NOTE    Bianca Matthews  EGB:[id number] DOB: Dec 31, 1927 DOA: 10/14/2017 PCP: Laurey Morale, MD   Brief Narrative:  84 year old female with history of hypertension, hyperlipidemia, GERD, recent weight loss of 40 pounds since August of last year, recent diagnosis of numerous sclerotic lesions throughout her lumbar spine and ribs which were worrisome for blastic metastases presented with confusion along with generalized weakness and decreased oral intake.  She was admitted with hyponatremia and UTI and started on intravenous fluids and antibiotics.  She was found to have right-sided lung cancer with probable brain, peritoneal and bony metastases.  Oncology and palliative care were consulted   Assessment & Plan:   Active Problems:   Hyponatremia   Hypoxia   Anemia   Compression fracture of body of thoracic vertebra (HCC)   Bony metastasis (HCC)   Weakness generalized   DNR (do not resuscitate)   Palliative care by specialist   Cancer associated pain    Hyponatremia -Probably from poor oral intake and dehydration.  Improved with IV fluids -Discontinue IV fluids  UTI -Follow cultures.  Continue Rocephin  New diagnosis of probable right-sided lung cancer with probable brain, peritoneal and bony metastases with pathologic thoracic vertebral fractures -Oncology evaluation appreciated  -Overall prognosis is very poor.  Palliative care following and patient will probably be set up for home hospice. -We will not repeat any further blood work or imaging  Severe protein calorie malnutrition along with weight loss probably secondary to metastatic cancer  -Diet as per nutrition recommendations  Pathologic thoracic vertebral fractures -We will hold off on further imaging studies.  Probable plan for home hospice.  Anemia -Probably chronic anemia from metastatic cancer    DVT prophylaxis:  DC  Lovenox to focus on comfort Code Status: DNR Family Communication: None at bedside Disposition Plan: Probable home hospice  Consultants: Oncology; relative care  Procedures: None  Antimicrobials: Rocephin from 10/14/2017 onwards   Subjective: Patient seen and examined at bedside.  She is awake but slow to respond to questions.  No overnight fever or vomiting.  She asks me how she ended up in the hospital  Objective: Vitals:   10/15/17 1800 10/15/17 2102 10/16/17 0744 10/16/17 0906  BP: (!) 92/48 (!) 100/40  (!) 118/41  Pulse:  70  72  Resp:  18    Temp:  99.8 F (37.7 C)    TempSrc:  Oral    SpO2:  96%    Weight:  68.9 kg (151 lb 14.4 oz) 69.1 kg (152 lb 5.4 oz)   Height:        Intake/Output Summary (Last 24 hours) at 10/16/2017 1047 Last data filed at 10/15/2017 2200 Gross per 24 hour  Intake 1233.75 ml  Output 800 ml  Net 433.75 ml   Filed Weights   10/15/17 0512 10/15/17 2102 10/16/17 0744  Weight: 67.7 kg (149 lb 4 oz) 68.9 kg (151 lb 14.4 oz) 69.1 kg (152 lb 5.4 oz)    Examination:  General exam: Appears calm and comfortable.  No acute distress.  Respiratory system: Bilateral decreased breath sound at bases Cardiovascular system: S1 & S2 heard, rate controlled gastrointestinal system: Abdomen is nondistended, soft and nontender. Normal bowel sounds heard. Extremities: No cyanosis, clubbing, edema     Data Reviewed: I have personally reviewed following labs and imaging studies  CBC: Recent Labs  Lab 10/14/17 1643 10/15/17 0528 10/16/17 3710  WBC 8.7 8.1 9.1  NEUTROABS  --   --  7.7  HGB 10.3* 9.4* 8.2*  HCT 32.2* 29.7* 25.7*  MCV 81.9 82.5 83.4  PLT 322 301 767   Basic Metabolic Panel: Recent Labs  Lab 10/14/17 1643 10/15/17 0528 10/16/17 0611  NA 129* 134* 135  K 4.1 3.6 3.5  CL 91* 97* 100*  CO2 30 27 26   GLUCOSE 102* 99 107*  BUN 23* 17 15  CREATININE 0.68 0.57 0.54  CALCIUM 8.9 8.5* 8.6*  MG  --   --  2.2   GFR: Estimated  Creatinine Clearance: 47.3 mL/min (by C-G formula based on SCr of 0.54 mg/dL). Liver Function Tests: Recent Labs  Lab 10/14/17 1643 10/15/17 0528  AST 27 19  ALT 15 12*  ALKPHOS 200* 165*  BILITOT 0.6 0.4  PROT 7.4 6.2*  ALBUMIN 2.8* 2.3*   No results for input(s): LIPASE, AMYLASE in the last 168 hours. No results for input(s): AMMONIA in the last 168 hours. Coagulation Profile: No results for input(s): INR, PROTIME in the last 168 hours. Cardiac Enzymes: No results for input(s): CKTOTAL, CKMB, CKMBINDEX, TROPONINI in the last 168 hours. BNP (last 3 results) Recent Labs    11/03/16 1302  PROBNP 239.0*   HbA1C: No results for input(s): HGBA1C in the last 72 hours. CBG: No results for input(s): GLUCAP in the last 168 hours. Lipid Profile: No results for input(s): CHOL, HDL, LDLCALC, TRIG, CHOLHDL, LDLDIRECT in the last 72 hours. Thyroid Function Tests: Recent Labs    10/15/17 0528  TSH 2.495   Anemia Panel: Recent Labs    10/15/17 0528  VITAMINB12 972*  FERRITIN 255  TIBC 210*  IRON 19*   Sepsis Labs: No results for input(s): PROCALCITON, LATICACIDVEN in the last 168 hours.  Recent Results (from the past 240 hour(s))  Culture, Urine     Status: Abnormal (Preliminary result)   Collection Time: 10/14/17  6:15 PM  Result Value Ref Range Status   Specimen Description URINE, CLEAN CATCH  Final   Special Requests NONE  Final   Culture (A)  Final    >=100,000 COLONIES/mL UNIDENTIFIED ORGANISM Performed at [facility], [address] [place], [place] [address]    Report Status PENDING  Incomplete         Radiology Studies: Dg Chest 2 View  Result Date: 10/14/2017 CLINICAL DATA:  84 y/o F; weakness, confusion, and loss of appetite for 2 weeks. EXAM: CHEST  2 VIEW COMPARISON:  05/05/2017 chest radiograph. FINDINGS: Normal cardiac silhouette. Aortic atherosclerosis with calcification. Pulmonary vascular congestion. Elevated right hemidiaphragm. No  pleural effusion or pneumothorax. Mild interval anterior compression deformity of lower thoracic, probably T11 vertebral body. IMPRESSION: Pulmonary vascular congestion. Elevated right hemidiaphragm. Interval lower thoracic mild anterior compression deformity, probably T11. Electronically Signed   By: Kristine Garbe M.D.   On: 10/14/2017 18:04   Ct Head W & Wo Contrast  Result Date: 10/14/2017 CLINICAL DATA:  84 y/o F; generalized weakness and decreased appetite for 2 weeks. Pain all over body. EXAM: CT HEAD WITHOUT AND WITH CONTRAST TECHNIQUE: Contiguous axial images were obtained from the base of the skull through the vertex without and with intravenous contrast CONTRAST:  100 cc Isovue-300 COMPARISON:  05/05/2017 CT head. FINDINGS: Brain: No evidence of acute infarction, hemorrhage, hydrocephalus, extra-axial collection or mass lesion/mass effect. Interval small chronic infarction within the left inferior cerebellum and progression of moderate chronic microvascular ischemic changes and parenchymal volume loss of the  brain. 5 mm enhancing foci in the left paramedian parietal lobe and left temporal lobe (series 12 image 12 and 22). There several additional faint cortical foci of enhancement in the right frontal lobe (series 11, image 25 and 26). Vascular: No hyperdense vessel or unexpected calcification. Visible vessels are patent. Skull: Normal. Negative for fracture or focal lesion. Sinuses/Orbits: No acute finding. Other: None. IMPRESSION: 1. Two subcentimeter enhancing lesions in left paramedian parietal and left temporal lobes as well as few punctate foci in right frontal lobe cortex likely representing metastatic disease. 2. No evidence of acute infarction, hemorrhage, or mass effect. 3. Progression of moderate chronic microvascular ischemic changes and parenchymal volume loss of the brain. Electronically Signed   By: Kristine Garbe M.D.   On: 10/14/2017 21:43   Ct Chest W  Contrast  Result Date: 10/14/2017 CLINICAL DATA:  Generalized weakness. Anorexia. Diffuse pain. Weight loss. New suspected bone metastases identified on recent lumbar spine CT and bone scintigraphy studies. EXAM: CT CHEST, ABDOMEN, AND PELVIS WITH CONTRAST TECHNIQUE: Multidetector CT imaging of the chest, abdomen and pelvis was performed following the standard protocol during bolus administration of intravenous contrast. CONTRAST:  111mL ISOVUE-300 IOPAMIDOL (ISOVUE-300) INJECTION 61% COMPARISON:  10/01/2017 lumbar spine CT. 06/13/2005 CT abdomen/pelvis. 10/12/2017 bone scintigraphy study. FINDINGS: CT CHEST FINDINGS Cardiovascular: Normal heart size. No significant pericardial fluid/thickening. Right coronary atherosclerosis. Atherosclerotic nonaneurysmal thoracic aorta. Top-normal caliber main pulmonary artery (3.0 cm diameter). No central pulmonary emboli. Mediastinum/Nodes: No discrete thyroid nodules. Unremarkable esophagus. No axillary adenopathy. Enlarged 2.6 cm right anterior mediastinal prevascular node (series 2/image 26). Enlarged 2.0 cm right hilar node (series 2/image 25). No left hilar adenopathy. Lungs/Pleura: No pneumothorax. No pleural effusion. Moderate centrilobular emphysema with diffuse bronchial wall thickening. There is a slightly spiculated 5.1 x 4.5 cm medial segment right middle lobe lung mass occluding the right middle lobe bronchi, with central necrosis. Subsegmental scarring versus atelectasis in basilar left lower lobe. Solid 4 mm medial right upper lobe pulmonary nodule (series 9/image 46). No additional significant pulmonary nodules. Musculoskeletal: Several mildly expansile sclerotic osseous lesions are noted throughout the bilateral ribs, for example in the anterior right second rib, posterior right ninth rib and posterior left seventh rib. Patchy sclerotic lesions are noted throughout the thoracic spine and sternum. Mild pathologic fractures of T10 and T11 vertebral bodies appear  slightly worsened since 10/01/2017. Moderate thoracic spondylosis. CT ABDOMEN PELVIS FINDINGS Hepatobiliary: Normal liver size. A few small scattered simple liver cyst measuring up to 1.4 cm in the left liver lobe. At least 2 additional subcentimeter scattered hypodense liver lesions (series 2/image 33 and image 43) are too small to characterize and were not definitely seen on 06/13/2005 CT abdomen study. Normal gallbladder with no radiopaque cholelithiasis. No biliary ductal dilatation. Pancreas: Normal, with no mass or duct dilation. Spleen: Normal size. No mass. Adrenals/Urinary Tract: Right adrenal 2.9 cm mass (series 2/image 47) with density 61 HU, not appreciably changed since 09/12 6 CT, most compatible with a benign adenoma. Left adrenal 1.4 cm nodule with density 61 HU, not appreciably changed since 06/13/2005 CT abdomen study, most compatible with a benign adenoma. Hydronephrosis. Simple 1.1 cm renal cyst in the anteromedial lower left kidney. No additional renal lesions. Bladder is completely collapsed by indwelling Foley catheter. Gas in the nondependent bladder is compatible with instrumentation. Stomach/Bowel: Normal non-distended stomach. Normal caliber small bowel with no small bowel wall thickening. Appendectomy. Normal large bowel with no diverticulosis, large bowel wall thickening or pericolonic fat stranding. Vascular/Lymphatic: Atherosclerotic  nonaneurysmal abdominal aorta. Patent portal, splenic and renal veins. Mildly enlarged 1.1 cm central mesenteric node (series 2/image 68), new since 2006 CT. No additional pathologically enlarged abdominopelvic nodes. Reproductive: Coarsely calcified subcentimeter right uterine fibroid. No adnexal masses. Other: No pneumoperitoneum, ascites or focal fluid collection. There is a 1.0 cm soft tissue nodule abutting anterior margin of the lateral segment left liver lobe (series 2/image 53), new since 06/13/2005 CT. Musculoskeletal: Patchy confluent mildly  expansile sclerotic osseous lesions throughout the lumbar spine and bilateral pelvic girdle including the bilateral proximal femora, all new since 06/13/2005 CT. Moderate lumbar spondylosis. IMPRESSION: 1. Centrally necrotic slightly spiculated 5.1 cm medial segment right middle lobe lung mass, most compatible with primary bronchogenic carcinoma given the smoking related changes in the lungs. Tiny right upper lobe pulmonary nodule, cannot exclude ipsilateral pulmonary metastasis. 2. Right hilar and prevascular right anterior mediastinal adenopathy. Central mesenteric adenopathy. Suspect nodal metastatic disease. 3. Small soft tissue nodule abutting the anterior margin of the lateral segment left liver lobe. Suspect peritoneal metastasis. 4. Widespread patchy confluent sclerotic osseous metastatic disease throughout the axial and proximal appendicular skeleton. Involvement of the proximal femora bilaterally, predisposing to pathologic fractures in these locations. Subacute pathologic T10 and T11 vertebral compression fractures are minimally worsened since 10/01/2017 lumbar spine CT. 5. Aortic Atherosclerosis (ICD10-I70.0) and Emphysema (ICD10-J43.9). Right coronary atherosclerosis. 6. Bilateral adrenal adenomas. Electronically Signed   By: Ilona Sorrel M.D.   On: 10/14/2017 22:11   Mr Brain Wo Contrast  Result Date: 10/15/2017 CLINICAL DATA:  84 year old female with confusion and generalized weakness. Suspicion of several small enhancing brain lesions on head CT without and with contrast yesterday. Right middle lobe lung mass discovered on subsequent body CT. Abnormal whole-body bone scan suspicious for metastatic disease on 10/12/2017 performed following abnormal lumbar spine CT demonstrating multiple sclerotic bone lesions on 10/01/2017. A study without and with contrast was planned but the examination had to be discontinued prior to completion due to claustrophobia and confusion, despite premedication. EXAM:  MRI HEAD WITHOUT CONTRAST TECHNIQUE: Multiplanar, multiecho pulse sequences of the brain and surrounding structures were obtained without intravenous contrast. CONTRAST:  None. COMPARISON:  Head CT without and with contrast 10/14/2017. FINDINGS: Brain: There are small areas of cerebral edema and/or T2 heterogeneous brain lesions identified at the medial right superior frontal gyrus (series 7, image 21) and left parietal lobe (image 18) corresponding to 2 of the abnormally enhancing areas seen recently by CT. Of these, the left parietal lesion may have restricted diffusion due to hyper cellularity. Additionally there is a small round focus of abnormal diffusion in the left cerebellum (series 6, image 9) suspicious for a small cerebellar hypercellular metastasis and this might correspond to subtle T2 signal abnormality on series 7, image 4. Questionable subtle signal abnormality also in the left inferior temporal lobe on image 9 corresponding to a small enhancing lesion on CT. No intracranial mass effect. Additional nonspecific bilateral cerebral white matter T2 and FLAIR hyperintensity such as due to chronic small vessel disease. No restricted diffusion suggestive of acute infarction. No acute intracranial hemorrhage identified. No ventriculomegaly. No extra-axial collection identified. No cortical encephalomalacia. Grossly negative pituitary region and cervicomedullary junction. Vascular: Major intracranial vascular flow voids are preserved. Skull and upper cervical spine: Motion degraded, but visible bone marrow signal appears within normal limits. Sinuses/Orbits: Negative orbits aside from postoperative changes to both globes. Paranasal Visualized paranasal sinuses and mastoids are stable and well pneumatized. Other: Negative visible scalp and face soft tissues. IMPRESSION: 1.  The examination had to be discontinued prior to contrast administration. 2. Areas of mild edema and/or hyper-cellularity corresponding to  the small enhancing lesions on the recent CT compatible with metastatic disease to the brain. An additional small left cerebellar metastasis is also suspected. A follow-up brain MRI without and with contrast when the patient can fully cooperate might detect additional metastases. 3. No intracranial mass effect. Electronically Signed   By: Genevie Ann M.D.   On: 10/15/2017 13:52   Ct Abdomen Pelvis W Contrast  Result Date: 10/14/2017 CLINICAL DATA:  Generalized weakness. Anorexia. Diffuse pain. Weight loss. New suspected bone metastases identified on recent lumbar spine CT and bone scintigraphy studies. EXAM: CT CHEST, ABDOMEN, AND PELVIS WITH CONTRAST TECHNIQUE: Multidetector CT imaging of the chest, abdomen and pelvis was performed following the standard protocol during bolus administration of intravenous contrast. CONTRAST:  118mL ISOVUE-300 IOPAMIDOL (ISOVUE-300) INJECTION 61% COMPARISON:  10/01/2017 lumbar spine CT. 06/13/2005 CT abdomen/pelvis. 10/12/2017 bone scintigraphy study. FINDINGS: CT CHEST FINDINGS Cardiovascular: Normal heart size. No significant pericardial fluid/thickening. Right coronary atherosclerosis. Atherosclerotic nonaneurysmal thoracic aorta. Top-normal caliber main pulmonary artery (3.0 cm diameter). No central pulmonary emboli. Mediastinum/Nodes: No discrete thyroid nodules. Unremarkable esophagus. No axillary adenopathy. Enlarged 2.6 cm right anterior mediastinal prevascular node (series 2/image 26). Enlarged 2.0 cm right hilar node (series 2/image 25). No left hilar adenopathy. Lungs/Pleura: No pneumothorax. No pleural effusion. Moderate centrilobular emphysema with diffuse bronchial wall thickening. There is a slightly spiculated 5.1 x 4.5 cm medial segment right middle lobe lung mass occluding the right middle lobe bronchi, with central necrosis. Subsegmental scarring versus atelectasis in basilar left lower lobe. Solid 4 mm medial right upper lobe pulmonary nodule (series 9/image 46).  No additional significant pulmonary nodules. Musculoskeletal: Several mildly expansile sclerotic osseous lesions are noted throughout the bilateral ribs, for example in the anterior right second rib, posterior right ninth rib and posterior left seventh rib. Patchy sclerotic lesions are noted throughout the thoracic spine and sternum. Mild pathologic fractures of T10 and T11 vertebral bodies appear slightly worsened since 10/01/2017. Moderate thoracic spondylosis. CT ABDOMEN PELVIS FINDINGS Hepatobiliary: Normal liver size. A few small scattered simple liver cyst measuring up to 1.4 cm in the left liver lobe. At least 2 additional subcentimeter scattered hypodense liver lesions (series 2/image 33 and image 43) are too small to characterize and were not definitely seen on 06/13/2005 CT abdomen study. Normal gallbladder with no radiopaque cholelithiasis. No biliary ductal dilatation. Pancreas: Normal, with no mass or duct dilation. Spleen: Normal size. No mass. Adrenals/Urinary Tract: Right adrenal 2.9 cm mass (series 2/image 47) with density 61 HU, not appreciably changed since 09/12 6 CT, most compatible with a benign adenoma. Left adrenal 1.4 cm nodule with density 61 HU, not appreciably changed since 06/13/2005 CT abdomen study, most compatible with a benign adenoma. Hydronephrosis. Simple 1.1 cm renal cyst in the anteromedial lower left kidney. No additional renal lesions. Bladder is completely collapsed by indwelling Foley catheter. Gas in the nondependent bladder is compatible with instrumentation. Stomach/Bowel: Normal non-distended stomach. Normal caliber small bowel with no small bowel wall thickening. Appendectomy. Normal large bowel with no diverticulosis, large bowel wall thickening or pericolonic fat stranding. Vascular/Lymphatic: Atherosclerotic nonaneurysmal abdominal aorta. Patent portal, splenic and renal veins. Mildly enlarged 1.1 cm central mesenteric node (series 2/image 68), new since 2006 CT. No  additional pathologically enlarged abdominopelvic nodes. Reproductive: Coarsely calcified subcentimeter right uterine fibroid. No adnexal masses. Other: No pneumoperitoneum, ascites or focal fluid collection. There is a 1.0  cm soft tissue nodule abutting anterior margin of the lateral segment left liver lobe (series 2/image 53), new since 06/13/2005 CT. Musculoskeletal: Patchy confluent mildly expansile sclerotic osseous lesions throughout the lumbar spine and bilateral pelvic girdle including the bilateral proximal femora, all new since 06/13/2005 CT. Moderate lumbar spondylosis. IMPRESSION: 1. Centrally necrotic slightly spiculated 5.1 cm medial segment right middle lobe lung mass, most compatible with primary bronchogenic carcinoma given the smoking related changes in the lungs. Tiny right upper lobe pulmonary nodule, cannot exclude ipsilateral pulmonary metastasis. 2. Right hilar and prevascular right anterior mediastinal adenopathy. Central mesenteric adenopathy. Suspect nodal metastatic disease. 3. Small soft tissue nodule abutting the anterior margin of the lateral segment left liver lobe. Suspect peritoneal metastasis. 4. Widespread patchy confluent sclerotic osseous metastatic disease throughout the axial and proximal appendicular skeleton. Involvement of the proximal femora bilaterally, predisposing to pathologic fractures in these locations. Subacute pathologic T10 and T11 vertebral compression fractures are minimally worsened since 10/01/2017 lumbar spine CT. 5. Aortic Atherosclerosis (ICD10-I70.0) and Emphysema (ICD10-J43.9). Right coronary atherosclerosis. 6. Bilateral adrenal adenomas. Electronically Signed   By: Ilona Sorrel M.D.   On: 10/14/2017 22:11        Scheduled Meds: . atenolol  100 mg Oral BID  . feeding supplement (PRO-STAT SUGAR FREE 64)  30 mL Oral BID  . LORazepam  0.5 mg Oral Q8H  . pantoprazole  40 mg Oral Daily   Continuous Infusions: . cefTRIAXone (ROCEPHIN)  IV Stopped  (10/15/17 2141)     LOS: 2 days        Aline August, MD [facility] Pager [phone]  If 7PM-7AM, please contact night-coverage [email or web address] Password [email or web address] 10/16/2017, 10:47 AM

## 2017-10-16 NOTE — Telephone Encounter (Signed)
Copied from [place]. Topic: General - Other >> Oct 16, 2017  4:23 PM Patrice Paradise wrote: Reason for CRM: Delsa Sale from [facility] [place] [phone]. She calling to see if Dr. Sarajane Jews would be the attending physician for the patient.

## 2017-10-16 NOTE — Progress Notes (Signed)
Patient ID: Bianca Matthews, female   DOB: [birth year], 84 y.o.   MRN: [id number]  This NP visited patient at the bedside as a follow up to  yesterday's [place].  Continued discussion regarding diagnosis, prognosis, GOCs and EOL wishes with patient and her son and daughter at bedside.   Patient and family has made decision for a comfort path.  Focus of care is comfort quality and dignity.  They are hopeful for transition home with hospice/I will write for choice.   Prognosis is likely less than 3 months  - DNR/DNI -avoid rehospitalization -no desire to investigate further, or seek treatment for, newly found metastatic disease -diet as tolerated no artificial feeding now or in the future -minimize medications, continue antibiotics through discharge and then make decisions dependent on situation -Symptom management for pain and anxiety/ Ativan and Vicodin   Leave foley cath for           EOL care  Family plans to augment care with 24-hour by private  Nurses aides    As discussed with Dr. Starla Link anticipate discharge home in the morning  Questions and concerns addressed   Emotional support offered  Time in  0930         Time out    1030   Total time spent on the unit was  Discussed with Dr Starla Link  Greater than 50% of the time was spent in counseling and coordination of care  Wadie Lessen NP  Palliative Medicine Team Team Phone # [phone] Pager [phone]

## 2017-10-16 NOTE — Progress Notes (Signed)
[facility])  Received request from [facility] for family interest in home hospice services after discharge. Chart reviewed. Spoke with son [name] to confirm interest and gather information. Eligibility confirmed by [place] Director. RNCM Kathy aware.   Spoke with son [name] to initiate education related to hospice philosophy, services and team approach to care. [name] verbalized good understanding of information provided. Per discussion, plan is for discharge via PTAR tomorrow after DME has been delivered.   DME discussed. Hospital bed with split rails, over bed table, 3N1, WC and oxygen setup have been ordered by [facility] DME manager per Mike's request. [name] is contact for [place] to work with to coordinate DME delivery. Home address has been verified and is correct in chart. [name] was given [facility] contact information and is aware [facility] referral center specialist will arrange admission visit after patient arrives home.   Please send signed and completed out of facility DNR home with patient.  Please send scripts for any medication patient does not already have including comfort medications.   Please do not hesitate to call with hospice related questions.   Thank you for the opportunity to provide services to this patient and family.  Erling Conte, LCSW [phone]  [address] are listed on [facility].

## 2017-10-16 NOTE — Progress Notes (Signed)
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 PT will sign off at this time. Please reorder if indicated.   Claretha Cooper 10/16/2017, 7:54 AM  Tresa Endo PT [phone]

## 2017-10-17 ENCOUNTER — Ambulatory Visit: Payer: Medicare PPO | Admitting: Podiatry

## 2017-10-17 DIAGNOSIS — E871 Hypo-osmolality and hyponatremia: Principal | ICD-10-CM

## 2017-10-17 DIAGNOSIS — R531 Weakness: Secondary | ICD-10-CM

## 2017-10-17 DIAGNOSIS — C3491 Malignant neoplasm of unspecified part of right bronchus or lung: Secondary | ICD-10-CM

## 2017-10-17 DIAGNOSIS — C7951 Secondary malignant neoplasm of bone: Secondary | ICD-10-CM

## 2017-10-17 DIAGNOSIS — S22000A Wedge compression fracture of unspecified thoracic vertebra, initial encounter for closed fracture: Secondary | ICD-10-CM

## 2017-10-17 DIAGNOSIS — D649 Anemia, unspecified: Secondary | ICD-10-CM

## 2017-10-17 LAB — URINE CULTURE: Culture: >=100000 — AB

## 2017-10-17 MED ORDER — OXYCODONE-ACETAMINOPHEN 5-325 MG PO TABS
2.0000 | ORAL_TABLET | Freq: Once | ORAL | Status: AC
  Administered 2017-10-17: 2 via ORAL
  Filled 2017-10-17: qty 2

## 2017-10-17 MED ORDER — LORAZEPAM 0.5 MG PO TABS: 0.5000 mg | ORAL_TABLET | Freq: Three times a day (TID) | ORAL | 0 refills | Status: DC

## 2017-10-17 MED ORDER — HYDROCODONE-ACETAMINOPHEN 5-325 MG PO TABS: 1.0000 | ORAL_TABLET | ORAL | 0 refills | Status: AC | PRN

## 2017-10-17 MED ORDER — BACLOFEN 10 MG PO TABS: 10.0000 mg | ORAL_TABLET | Freq: Three times a day (TID) | ORAL | 0 refills | Status: AC | PRN

## 2017-10-17 NOTE — Progress Notes (Signed)
   10/17/17 1500  Clinical Encounter Type  Visited With Patient and family together  Visit Type Follow-up  Spiritual Encounters  Spiritual Needs (General Support)   Rounding on Patients on the Palliative list.  Patient has a SCC that I covered yesterday, but wanted to follow up today.  Patient had a family member in the room.  Was welcoming and very appreciative of the [place].  She talked about her family and reflected on some of her family and life.  Will follow as needed. Chaplain Katherene Ponto

## 2017-10-17 NOTE — Care Management Note (Signed)
Case Management Note  Patient Details  Name: Lizette Pazos MRN: [id number] Date of Birth: 1928/05/22  Subjective/Objective: d/c home w/[facility] rep aware. DME delivery time 2-4p. Patient to be d/c by ambulance PTAR after dme has arrived to home. PTAR/DNR forms on shadow chart.                    Action/Plan:d/c home w/Hospice/PTAR   Expected Discharge Date:  10/17/17               Expected Discharge Plan:  Home w Hospice Care  In-House Referral:     Discharge planning Services  CM Consult  Post Acute Care Choice:  Durable Medical Equipment(rw) Choice offered to:  Adult Children  DME Arranged:    DME Agency:     HH Arranged:  RN [place] Agency:  [facility]  Status of Service:  Completed, signed off  If discussed at [place] of Stay Meetings, dates discussed:    Additional Comments:  Dessa Phi, RN 10/17/2017, 10:29 AM

## 2017-10-17 NOTE — Care Management Important Message (Signed)
Important Message  Patient Details  Name: Bianca Matthews MRN: [id number] Date of Birth: January 15, 1928   Medicare Important Message Given:  Yes    Kerin Salen 10/17/2017, 9:46 AMImportant Message  Patient Details  Name: Bianca Matthews MRN: [id number] Date of Birth: June 27, 1928   Medicare Important Message Given:  Yes    Kerin Salen 10/17/2017, 9:45 AM

## 2017-10-17 NOTE — Progress Notes (Signed)
Placed pt on 2L of oxygen at this time d/t pt desaturating to 88-89% via RA. Pt denies SOB or difficulty breathing. Pt's family is requesting to speak to Dr. Starla Link at bedside d/t pt's BP elevating to 176/74. Dr. Starla Link to come up to bedside.

## 2017-10-17 NOTE — Discharge Summary (Signed)
Physician Discharge Summary  Courtland Coppa GUY:[id number] DOB: 03-Nov-1927 DOA: 10/14/2017  PCP: Laurey Morale, MD  Admit date: 10/14/2017 Discharge date: 10/17/2017  Admitted From: Home Disposition: Home with hospice  Recommendations for Outpatient Follow-up:  Follow up with home hospice at earliest [place]: Home hospice Equipment/Devices:   Discharge Condition: Poor  CODE STATUS: DNR  diet recommendation: As tolerated  Brief/Interim Summary:  84 year old female with history of hypertension, hyperlipidemia, GERD, recent weight loss of 40 pounds since August of last year, recent diagnosis of numerous sclerotic lesions throughout her lumbar spine and ribs which were worrisome for blastic metastases presented with confusion along with generalized weakness and decreased oral intake.  She was admitted with hyponatremia and UTI and started on intravenous fluids and antibiotics.  She was found to have right-sided lung cancer with probable brain, peritoneal and bony metastases and thoracic vertebral fractures.  Oncology and palliative care were consulted.  Oncology recommended palliative/hospice care.  Palliative care was consulted.  Overall prognosis is very poor; patient will be discharged home today with home hospice.  If patient's condition worsens, nonessential medications might be discontinued and patient might benefit from comfort measures only.   Discharge Diagnoses:  Active Problems:   Hyponatremia   Hypoxia   Anemia   Compression fracture of body of thoracic vertebra (HCC)   Bony metastasis (HCC)   Weakness generalized   DNR (do not resuscitate)   Palliative care by specialist   Cancer associated pain  New diagnosis of probable right-sided lung cancer with probable brain, peritoneal and bony metastases with pathologic thoracic vertebral fractures -Oncology evaluation appreciated. Oncology recommended palliative/hospice care -Overall prognosis is very poor.   Palliative care evaluated the patient. Patient will be discharged home today with home hospice.  If patient's condition worsens, nonessential medications might be discontinued and patient might benefit from comfort measures only.  Hyponatremia -Resolved with intravenous fluids.  No lab work done for today  UTI -Treated with Rocephin.  Urine culture grew coagulase-negative Staphylococcus.  No further antibiotics  Severe protein calorie malnutrition along with weight loss probably secondary to metastatic cancer  -Diet as per nutrition recommendations  Pathologic thoracic vertebral fractures -We will hold off on further imaging studies.  Plan for home hospice.  Anemia -Probably chronic anemia from metastatic cancer      Discharge Instructions  Discharge Instructions    Diet general   Complete by:  As directed    Increase activity slowly   Complete by:  As directed      Allergies as of 10/17/2017   No Known Allergies     Medication List    STOP taking these medications   furosemide 40 MG tablet Commonly known as:  LASIX   potassium chloride 10 MEQ tablet Commonly known as:  KLOR-CON 10     TAKE these medications   atenolol 100 MG tablet Commonly known as:  TENORMIN Take 100 mg by mouth 2 (two) times daily.   baclofen 10 MG tablet Commonly known as:  LIORESAL Take 1 tablet (10 mg total) by mouth every 8 (eight) hours as needed for muscle spasms (Pain).   BIOFREEZE ROLL-ON 4 % Gel Generic drug:  Menthol (Topical Analgesic) Apply 1 application topically as needed (pain).   HYDROcodone-acetaminophen 5-325 MG tablet Commonly known as:  NORCO/VICODIN Take 1 tablet by mouth every 4 (four) hours as needed for moderate pain.   LORazepam 0.5 MG tablet Commonly known as:  ATIVAN Take 1 tablet (0.5 mg total)  by mouth every 8 (eight) hours.   magnesium hydroxide 400 MG/5ML suspension Commonly known as:  MILK OF MAGNESIA Take 15 mLs by mouth daily as needed for  mild constipation.   omeprazole 40 MG capsule Commonly known as:  PRILOSEC TAKE 1 CAPSULE EVERY DAY   polyethylene glycol packet Commonly known as:  MIRALAX / GLYCOLAX Take 17 g by mouth daily as needed for mild constipation.   PROBIOTIC FORMULA PO Take 1 tablet by mouth daily.   rosuvastatin 10 MG tablet Commonly known as:  CRESTOR TAKE 1 TABLET AT BEDTIME   SALONPAS PAIN RELIEF PATCH EX Apply 1 patch topically as needed (pain).   temazepam 30 MG capsule Commonly known as:  RESTORIL Take 1 capsule (30 mg total) by mouth at bedtime as needed for sleep.      Follow-up Information    [facility] Follow up.   Specialty:  Hospice and Palliative Medicine Why:  Home nurse visit Contact information: [address] [place] [id number] [phone]          No Known Allergies  Consultations:  Oncology and palliative care   Procedures/Studies: Dg Chest 2 View  Result Date: 10/14/2017 CLINICAL DATA:  84 y/o F; weakness, confusion, and loss of appetite for 2 weeks. EXAM: CHEST  2 VIEW COMPARISON:  05/05/2017 chest radiograph. FINDINGS: Normal cardiac silhouette. Aortic atherosclerosis with calcification. Pulmonary vascular congestion. Elevated right hemidiaphragm. No pleural effusion or pneumothorax. Mild interval anterior compression deformity of lower thoracic, probably T11 vertebral body. IMPRESSION: Pulmonary vascular congestion. Elevated right hemidiaphragm. Interval lower thoracic mild anterior compression deformity, probably T11. Electronically Signed   By: Kristine Garbe M.D.   On: 10/14/2017 18:04   Ct Head W & Wo Contrast  Result Date: 10/14/2017 CLINICAL DATA:  84 y/o F; generalized weakness and decreased appetite for 2 weeks. Pain all over body. EXAM: CT HEAD WITHOUT AND WITH CONTRAST TECHNIQUE: Contiguous axial images were obtained from the base of the skull through the vertex without and with intravenous contrast CONTRAST:  100 cc  Isovue-300 COMPARISON:  05/05/2017 CT head. FINDINGS: Brain: No evidence of acute infarction, hemorrhage, hydrocephalus, extra-axial collection or mass lesion/mass effect. Interval small chronic infarction within the left inferior cerebellum and progression of moderate chronic microvascular ischemic changes and parenchymal volume loss of the brain. 5 mm enhancing foci in the left paramedian parietal lobe and left temporal lobe (series 12 image 12 and 22). There several additional faint cortical foci of enhancement in the right frontal lobe (series 11, image 25 and 26). Vascular: No hyperdense vessel or unexpected calcification. Visible vessels are patent. Skull: Normal. Negative for fracture or focal lesion. Sinuses/Orbits: No acute finding. Other: None. IMPRESSION: 1. Two subcentimeter enhancing lesions in left paramedian parietal and left temporal lobes as well as few punctate foci in right frontal lobe cortex likely representing metastatic disease. 2. No evidence of acute infarction, hemorrhage, or mass effect. 3. Progression of moderate chronic microvascular ischemic changes and parenchymal volume loss of the brain. Electronically Signed   By: Kristine Garbe M.D.   On: 10/14/2017 21:43   Ct Chest W Contrast  Result Date: 10/14/2017 CLINICAL DATA:  Generalized weakness. Anorexia. Diffuse pain. Weight loss. New suspected bone metastases identified on recent lumbar spine CT and bone scintigraphy studies. EXAM: CT CHEST, ABDOMEN, AND PELVIS WITH CONTRAST TECHNIQUE: Multidetector CT imaging of the chest, abdomen and pelvis was performed following the standard protocol during bolus administration of intravenous contrast. CONTRAST:  140mL ISOVUE-300 IOPAMIDOL (ISOVUE-300) INJECTION  61% COMPARISON:  10/01/2017 lumbar spine CT. 06/13/2005 CT abdomen/pelvis. 10/12/2017 bone scintigraphy study. FINDINGS: CT CHEST FINDINGS Cardiovascular: Normal heart size. No significant pericardial fluid/thickening. Right  coronary atherosclerosis. Atherosclerotic nonaneurysmal thoracic aorta. Top-normal caliber main pulmonary artery (3.0 cm diameter). No central pulmonary emboli. Mediastinum/Nodes: No discrete thyroid nodules. Unremarkable esophagus. No axillary adenopathy. Enlarged 2.6 cm right anterior mediastinal prevascular node (series 2/image 26). Enlarged 2.0 cm right hilar node (series 2/image 25). No left hilar adenopathy. Lungs/Pleura: No pneumothorax. No pleural effusion. Moderate centrilobular emphysema with diffuse bronchial wall thickening. There is a slightly spiculated 5.1 x 4.5 cm medial segment right middle lobe lung mass occluding the right middle lobe bronchi, with central necrosis. Subsegmental scarring versus atelectasis in basilar left lower lobe. Solid 4 mm medial right upper lobe pulmonary nodule (series 9/image 46). No additional significant pulmonary nodules. Musculoskeletal: Several mildly expansile sclerotic osseous lesions are noted throughout the bilateral ribs, for example in the anterior right second rib, posterior right ninth rib and posterior left seventh rib. Patchy sclerotic lesions are noted throughout the thoracic spine and sternum. Mild pathologic fractures of T10 and T11 vertebral bodies appear slightly worsened since 10/01/2017. Moderate thoracic spondylosis. CT ABDOMEN PELVIS FINDINGS Hepatobiliary: Normal liver size. A few small scattered simple liver cyst measuring up to 1.4 cm in the left liver lobe. At least 2 additional subcentimeter scattered hypodense liver lesions (series 2/image 33 and image 43) are too small to characterize and were not definitely seen on 06/13/2005 CT abdomen study. Normal gallbladder with no radiopaque cholelithiasis. No biliary ductal dilatation. Pancreas: Normal, with no mass or duct dilation. Spleen: Normal size. No mass. Adrenals/Urinary Tract: Right adrenal 2.9 cm mass (series 2/image 47) with density 61 HU, not appreciably changed since 09/12 6 CT, most  compatible with a benign adenoma. Left adrenal 1.4 cm nodule with density 61 HU, not appreciably changed since 06/13/2005 CT abdomen study, most compatible with a benign adenoma. Hydronephrosis. Simple 1.1 cm renal cyst in the anteromedial lower left kidney. No additional renal lesions. Bladder is completely collapsed by indwelling Foley catheter. Gas in the nondependent bladder is compatible with instrumentation. Stomach/Bowel: Normal non-distended stomach. Normal caliber small bowel with no small bowel wall thickening. Appendectomy. Normal large bowel with no diverticulosis, large bowel wall thickening or pericolonic fat stranding. Vascular/Lymphatic: Atherosclerotic nonaneurysmal abdominal aorta. Patent portal, splenic and renal veins. Mildly enlarged 1.1 cm central mesenteric node (series 2/image 68), new since 2006 CT. No additional pathologically enlarged abdominopelvic nodes. Reproductive: Coarsely calcified subcentimeter right uterine fibroid. No adnexal masses. Other: No pneumoperitoneum, ascites or focal fluid collection. There is a 1.0 cm soft tissue nodule abutting anterior margin of the lateral segment left liver lobe (series 2/image 53), new since 06/13/2005 CT. Musculoskeletal: Patchy confluent mildly expansile sclerotic osseous lesions throughout the lumbar spine and bilateral pelvic girdle including the bilateral proximal femora, all new since 06/13/2005 CT. Moderate lumbar spondylosis. IMPRESSION: 1. Centrally necrotic slightly spiculated 5.1 cm medial segment right middle lobe lung mass, most compatible with primary bronchogenic carcinoma given the smoking related changes in the lungs. Tiny right upper lobe pulmonary nodule, cannot exclude ipsilateral pulmonary metastasis. 2. Right hilar and prevascular right anterior mediastinal adenopathy. Central mesenteric adenopathy. Suspect nodal metastatic disease. 3. Small soft tissue nodule abutting the anterior margin of the lateral segment left liver  lobe. Suspect peritoneal metastasis. 4. Widespread patchy confluent sclerotic osseous metastatic disease throughout the axial and proximal appendicular skeleton. Involvement of the proximal femora bilaterally, predisposing to pathologic fractures in these  locations. Subacute pathologic T10 and T11 vertebral compression fractures are minimally worsened since 10/01/2017 lumbar spine CT. 5. Aortic Atherosclerosis (ICD10-I70.0) and Emphysema (ICD10-J43.9). Right coronary atherosclerosis. 6. Bilateral adrenal adenomas. Electronically Signed   By: Ilona Sorrel M.D.   On: 10/14/2017 22:11   Ct Lumbar Spine Wo Contrast  Result Date: 10/01/2017 CLINICAL DATA:  Low back pain for several years. No acute injury, prior relevant surgery or history of malignancy. EXAM: CT LUMBAR SPINE WITHOUT CONTRAST TECHNIQUE: Multidetector CT imaging of the lumbar spine was performed without intravenous contrast administration. Multiplanar CT image reconstructions were also generated. COMPARISON:  Office lumbar radiographs 08/01/2017. Abdominal CT 06/13/2005. FINDINGS: Segmentation: Transitional lumbosacral anatomy. The lower ribs are difficult to visualize on prior chest radiographs. For the purposes of this report, it is assumed that the transitional segment is L5, partially sacralized. Alignment: 6 mm of degenerative anterolisthesis at L4-5. Otherwise normal. Vertebrae: There are numerous sclerotic lesions throughout the lumbar spine, ribs and pelvis which are new from the prior abdominal CT, highly worrisome for blastic metastatic disease. These are most prominent at T12, L1, L3, L4 and L5. No lytic lesion or pathologic fracture identified. There is some periosteal reaction surrounding the right L3 transverse process. Mild sacroiliac degenerative changes bilaterally. Paraspinal and other soft tissues: No paraspinal masses or retroperitoneal lymphadenopathy demonstrated. There is extensive aortic and branch vessel atherosclerosis. There  is atelectasis at the lung bases. Disc levels: No significant disc space findings are seen from T10-11 through L1-2. L2-3: Mild disc bulging. No spinal stenosis or nerve root encroachment. L3-4: Disc bulging with facet and ligamentous hypertrophy. There is mild triangulation of the thecal sac with mild narrowing of the left lateral recess. No foraminal compromise. L4-5: Advanced facet disease accounting for the grade 1 anterolisthesis. There is loss of disc height with annular disc bulging eccentric to the right. These factors contribute to mild spinal stenosis and mild narrowing of the right lateral recess and right foramen. L5-S1: As numbered, this is a transitional disc space level without acquired abnormality. IMPRESSION: 1. Transitional lumbosacral anatomy. L5 is assigned the transitional segment. 2. Degenerative anterolisthesis at L4-5 contributing to mild multifactorial spinal stenosis and mild narrowing of the right lateral recess and right foramen. 3. No other significant spinal stenosis or nerve root encroachment. 4. New sclerotic lesions throughout the visualized spine, ribs and bony pelvis worrisome for metastatic disease. Breast cancer would be the most likely explanation. Correlation with mammography recommended if not recently performed. Bone scan may be helpful to ascertain the extent of osseous metastatic disease. 5. These results will be called to the ordering clinician or representative by the Radiologist Assistant, and communication documented in the PACS or zVision Dashboard. Electronically Signed   By: Richardean Sale M.D.   On: 10/01/2017 10:51   Mr Brain Wo Contrast  Result Date: 10/15/2017 CLINICAL DATA:  84 year old female with confusion and generalized weakness. Suspicion of several small enhancing brain lesions on head CT without and with contrast yesterday. Right middle lobe lung mass discovered on subsequent body CT. Abnormal whole-body bone scan suspicious for metastatic disease on  10/12/2017 performed following abnormal lumbar spine CT demonstrating multiple sclerotic bone lesions on 10/01/2017. A study without and with contrast was planned but the examination had to be discontinued prior to completion due to claustrophobia and confusion, despite premedication. EXAM: MRI HEAD WITHOUT CONTRAST TECHNIQUE: Multiplanar, multiecho pulse sequences of the brain and surrounding structures were obtained without intravenous contrast. CONTRAST:  None. COMPARISON:  Head CT without and  with contrast 10/14/2017. FINDINGS: Brain: There are small areas of cerebral edema and/or T2 heterogeneous brain lesions identified at the medial right superior frontal gyrus (series 7, image 21) and left parietal lobe (image 18) corresponding to 2 of the abnormally enhancing areas seen recently by CT. Of these, the left parietal lesion may have restricted diffusion due to hyper cellularity. Additionally there is a small round focus of abnormal diffusion in the left cerebellum (series 6, image 9) suspicious for a small cerebellar hypercellular metastasis and this might correspond to subtle T2 signal abnormality on series 7, image 4. Questionable subtle signal abnormality also in the left inferior temporal lobe on image 9 corresponding to a small enhancing lesion on CT. No intracranial mass effect. Additional nonspecific bilateral cerebral white matter T2 and FLAIR hyperintensity such as due to chronic small vessel disease. No restricted diffusion suggestive of acute infarction. No acute intracranial hemorrhage identified. No ventriculomegaly. No extra-axial collection identified. No cortical encephalomalacia. Grossly negative pituitary region and cervicomedullary junction. Vascular: Major intracranial vascular flow voids are preserved. Skull and upper cervical spine: Motion degraded, but visible bone marrow signal appears within normal limits. Sinuses/Orbits: Negative orbits aside from postoperative changes to both globes.  Paranasal Visualized paranasal sinuses and mastoids are stable and well pneumatized. Other: Negative visible scalp and face soft tissues. IMPRESSION: 1. The examination had to be discontinued prior to contrast administration. 2. Areas of mild edema and/or hyper-cellularity corresponding to the small enhancing lesions on the recent CT compatible with metastatic disease to the brain. An additional small left cerebellar metastasis is also suspected. A follow-up brain MRI without and with contrast when the patient can fully cooperate might detect additional metastases. 3. No intracranial mass effect. Electronically Signed   By: Genevie Ann M.D.   On: 10/15/2017 13:52   Nm Bone Scan Whole Body  Result Date: 10/13/2017 CLINICAL DATA:  For metastatic disease of unknown type. EXAM: NUCLEAR MEDICINE WHOLE BODY BONE SCAN TECHNIQUE: Whole body anterior and posterior images were obtained approximately 3 hours after intravenous injection of radiopharmaceutical. RADIOPHARMACEUTICALS:  20.9 mCi Technetium-31m MDP IV COMPARISON:  None. FINDINGS: Extensive abnormal bony uptake noted throughout the thoracic and lumbar spine, ribs bilaterally, sternum, right greater than left, bony pelvis, bilateral proximal femurs, distal left femur and proximal left tibia compatible with metastatic disease. Soft tissue activity unremarkable. IMPRESSION: Extensive abnormal bony uptake compatible with bony metastatic disease. Electronically Signed   By: Rolm Baptise M.D.   On: 10/13/2017 07:30   Ct Abdomen Pelvis W Contrast  Result Date: 10/14/2017 CLINICAL DATA:  Generalized weakness. Anorexia. Diffuse pain. Weight loss. New suspected bone metastases identified on recent lumbar spine CT and bone scintigraphy studies. EXAM: CT CHEST, ABDOMEN, AND PELVIS WITH CONTRAST TECHNIQUE: Multidetector CT imaging of the chest, abdomen and pelvis was performed following the standard protocol during bolus administration of intravenous contrast. CONTRAST:   149mL ISOVUE-300 IOPAMIDOL (ISOVUE-300) INJECTION 61% COMPARISON:  10/01/2017 lumbar spine CT. 06/13/2005 CT abdomen/pelvis. 10/12/2017 bone scintigraphy study. FINDINGS: CT CHEST FINDINGS Cardiovascular: Normal heart size. No significant pericardial fluid/thickening. Right coronary atherosclerosis. Atherosclerotic nonaneurysmal thoracic aorta. Top-normal caliber main pulmonary artery (3.0 cm diameter). No central pulmonary emboli. Mediastinum/Nodes: No discrete thyroid nodules. Unremarkable esophagus. No axillary adenopathy. Enlarged 2.6 cm right anterior mediastinal prevascular node (series 2/image 26). Enlarged 2.0 cm right hilar node (series 2/image 25). No left hilar adenopathy. Lungs/Pleura: No pneumothorax. No pleural effusion. Moderate centrilobular emphysema with diffuse bronchial wall thickening. There is a slightly spiculated 5.1 x 4.5 cm  medial segment right middle lobe lung mass occluding the right middle lobe bronchi, with central necrosis. Subsegmental scarring versus atelectasis in basilar left lower lobe. Solid 4 mm medial right upper lobe pulmonary nodule (series 9/image 46). No additional significant pulmonary nodules. Musculoskeletal: Several mildly expansile sclerotic osseous lesions are noted throughout the bilateral ribs, for example in the anterior right second rib, posterior right ninth rib and posterior left seventh rib. Patchy sclerotic lesions are noted throughout the thoracic spine and sternum. Mild pathologic fractures of T10 and T11 vertebral bodies appear slightly worsened since 10/01/2017. Moderate thoracic spondylosis. CT ABDOMEN PELVIS FINDINGS Hepatobiliary: Normal liver size. A few small scattered simple liver cyst measuring up to 1.4 cm in the left liver lobe. At least 2 additional subcentimeter scattered hypodense liver lesions (series 2/image 33 and image 43) are too small to characterize and were not definitely seen on 06/13/2005 CT abdomen study. Normal gallbladder with no  radiopaque cholelithiasis. No biliary ductal dilatation. Pancreas: Normal, with no mass or duct dilation. Spleen: Normal size. No mass. Adrenals/Urinary Tract: Right adrenal 2.9 cm mass (series 2/image 47) with density 61 HU, not appreciably changed since 09/12 6 CT, most compatible with a benign adenoma. Left adrenal 1.4 cm nodule with density 61 HU, not appreciably changed since 06/13/2005 CT abdomen study, most compatible with a benign adenoma. Hydronephrosis. Simple 1.1 cm renal cyst in the anteromedial lower left kidney. No additional renal lesions. Bladder is completely collapsed by indwelling Foley catheter. Gas in the nondependent bladder is compatible with instrumentation. Stomach/Bowel: Normal non-distended stomach. Normal caliber small bowel with no small bowel wall thickening. Appendectomy. Normal large bowel with no diverticulosis, large bowel wall thickening or pericolonic fat stranding. Vascular/Lymphatic: Atherosclerotic nonaneurysmal abdominal aorta. Patent portal, splenic and renal veins. Mildly enlarged 1.1 cm central mesenteric node (series 2/image 68), new since 2006 CT. No additional pathologically enlarged abdominopelvic nodes. Reproductive: Coarsely calcified subcentimeter right uterine fibroid. No adnexal masses. Other: No pneumoperitoneum, ascites or focal fluid collection. There is a 1.0 cm soft tissue nodule abutting anterior margin of the lateral segment left liver lobe (series 2/image 53), new since 06/13/2005 CT. Musculoskeletal: Patchy confluent mildly expansile sclerotic osseous lesions throughout the lumbar spine and bilateral pelvic girdle including the bilateral proximal femora, all new since 06/13/2005 CT. Moderate lumbar spondylosis. IMPRESSION: 1. Centrally necrotic slightly spiculated 5.1 cm medial segment right middle lobe lung mass, most compatible with primary bronchogenic carcinoma given the smoking related changes in the lungs. Tiny right upper lobe pulmonary nodule,  cannot exclude ipsilateral pulmonary metastasis. 2. Right hilar and prevascular right anterior mediastinal adenopathy. Central mesenteric adenopathy. Suspect nodal metastatic disease. 3. Small soft tissue nodule abutting the anterior margin of the lateral segment left liver lobe. Suspect peritoneal metastasis. 4. Widespread patchy confluent sclerotic osseous metastatic disease throughout the axial and proximal appendicular skeleton. Involvement of the proximal femora bilaterally, predisposing to pathologic fractures in these locations. Subacute pathologic T10 and T11 vertebral compression fractures are minimally worsened since 10/01/2017 lumbar spine CT. 5. Aortic Atherosclerosis (ICD10-I70.0) and Emphysema (ICD10-J43.9). Right coronary atherosclerosis. 6. Bilateral adrenal adenomas. Electronically Signed   By: Ilona Sorrel M.D.   On: 10/14/2017 22:11   Mm Diag Breast Tomo Bilateral  Result Date: 10/09/2017 CLINICAL DATA:  Patient had a recent lumbar spine CT that showed sclerotic bone lesions suspicious for metastatic disease.Patient has no current breast complaints. Exam is to evaluate for a possible breast cancer primary. EXAM: 2D DIGITAL DIAGNOSTIC BILATERAL MAMMOGRAM WITH CAD AND ADJUNCT TOMO COMPARISON:  None ACR Breast Density Category b: There are scattered areas of fibroglandular density. FINDINGS: There are no discrete masses, areas of architectural distortion, areas of significant asymmetry or suspicious calcifications. Mammographic images were processed with CAD. IMPRESSION: Negative exam.  No evidence of malignancy. RECOMMENDATION: Screening mammogram in one year.(Code:SM-B-01Y) I have discussed the findings and recommendations with the patient. Results were also provided in writing at the conclusion of the visit. If applicable, a reminder letter will be sent to the patient regarding the next appointment. BI-RADS CATEGORY  1: Negative. Electronically Signed   By: Lajean Manes M.D.   On: 10/09/2017  14:46     Subjective: Patient seen and examined at bedside.  She is sleepy and hardly wakes up.  Discharge Exam: Vitals:   10/16/17 2007 10/17/17 0537  BP: (!) 136/50 (!) 131/58  Pulse: 80 80  Resp: 15 16  Temp: 98.3 F (36.8 C) 97.7 F (36.5 C)  SpO2: (!) 86% 92%   Vitals:   10/16/17 0906 10/16/17 1256 10/16/17 2007 10/17/17 0537  BP: (!) 118/41 (!) 118/39 (!) 136/50 (!) 131/58  Pulse: 72 72 80 80  Resp:   15 16  Temp:  97.8 F (36.6 C) 98.3 F (36.8 C) 97.7 F (36.5 C)  TempSrc:  Oral Oral Oral  SpO2:  92% (!) 86% 92%  Weight:    68.8 kg (151 lb 10.8 oz)  Height:        General: Elderly female lying in bed.  No distress.  Sleeping Cardiovascular: Rate controlled, S1/S2 + Respiratory: Bilateral decreased breath sounds at bases  The results of significant diagnostics from this hospitalization (including imaging, microbiology, ancillary and laboratory) are listed below for reference.     Microbiology: Recent Results (from the past 240 hour(s))  Culture, Urine     Status: Abnormal (Preliminary result)   Collection Time: 10/14/17  6:15 PM  Result Value Ref Range Status   Specimen Description URINE, CLEAN CATCH  Final   Special Requests NONE  Final   Culture (A)  Final    >=100,000 COLONIES/mL STAPHYLOCOCCUS SPECIES (COAGULASE NEGATIVE) SUSCEPTIBILITIES TO FOLLOW Performed at [facility], [address], [place], [place] [address]    Report Status PENDING  Incomplete     Labs: BNP (last 3 results) Recent Labs    05/05/17 2219  BNP 841.3*   Basic Metabolic Panel: Recent Labs  Lab 10/14/17 1643 10/15/17 0528 10/16/17 0611  NA 129* 134* 135  K 4.1 3.6 3.5  CL 91* 97* 100*  CO2 30 27 26   GLUCOSE 102* 99 107*  BUN 23* 17 15  CREATININE 0.68 0.57 0.54  CALCIUM 8.9 8.5* 8.6*  MG  --   --  2.2   Liver Function Tests: Recent Labs  Lab 10/14/17 1643 10/15/17 0528  AST 27 19  ALT 15 12*  ALKPHOS 200* 165*  BILITOT 0.6 0.4  PROT 7.4 6.2*   ALBUMIN 2.8* 2.3*   No results for input(s): LIPASE, AMYLASE in the last 168 hours. No results for input(s): AMMONIA in the last 168 hours. CBC: Recent Labs  Lab 10/14/17 1643 10/15/17 0528 10/16/17 0611  WBC 8.7 8.1 9.1  NEUTROABS  --   --  7.7  HGB 10.3* 9.4* 8.2*  HCT 32.2* 29.7*  28.7* 25.7*  MCV 81.9 82.5 83.4  PLT 322 301 237   Cardiac Enzymes: No results for input(s): CKTOTAL, CKMB, CKMBINDEX, TROPONINI in the last 168 hours. BNP: Invalid input(s): POCBNP CBG: No results for input(s):  GLUCAP in the last 168 hours. D-Dimer No results for input(s): DDIMER in the last 72 hours. Hgb A1c No results for input(s): HGBA1C in the last 72 hours. Lipid Profile No results for input(s): CHOL, HDL, LDLCALC, TRIG, CHOLHDL, LDLDIRECT in the last 72 hours. Thyroid function studies Recent Labs    10/15/17 0528  TSH 2.495   Anemia work up Recent Labs    10/15/17 0528  VITAMINB12 972*  FERRITIN 255  TIBC 210*  IRON 19*   Urinalysis    Component Value Date/Time   COLORURINE YELLOW 10/14/2017 [place] 10/14/2017 1815   LABSPEC 1.006 10/14/2017 1815   PHURINE 7.0 10/14/2017 1815   GLUCOSEU NEGATIVE 10/14/2017 1815   HGBUR SMALL (A) 10/14/2017 1815   BILIRUBINUR NEGATIVE 10/14/2017 1815   BILIRUBINUR n 02/24/2015 0937   KETONESUR NEGATIVE 10/14/2017 1815   PROTEINUR NEGATIVE 10/14/2017 1815   UROBILINOGEN 0.2 02/24/2015 0937   NITRITE NEGATIVE 10/14/2017 1815   LEUKOCYTESUR MODERATE (A) 10/14/2017 1815   Sepsis Labs Invalid input(s): PROCALCITONIN,  WBC,  LACTICIDVEN Microbiology Recent Results (from the past 240 hour(s))  Culture, Urine     Status: Abnormal (Preliminary result)   Collection Time: 10/14/17  6:15 PM  Result Value Ref Range Status   Specimen Description URINE, CLEAN CATCH  Final   Special Requests NONE  Final   Culture (A)  Final    >=100,000 COLONIES/mL STAPHYLOCOCCUS SPECIES (COAGULASE NEGATIVE) SUSCEPTIBILITIES TO  FOLLOW Performed at [facility], [place] [address], [place], [place] [address]    Report Status PENDING  Incomplete     Time coordinating discharge: 35 minutes  SIGNED:   Aline August, MD  [facility] 10/17/2017, 9:19 AM Pager: [phone]  If 7PM-7AM, please contact night-coverage [email or web address] Password [email or web address]

## 2017-10-18 ENCOUNTER — Telehealth: Payer: Self-pay | Admitting: Family Medicine

## 2017-10-18 MED ORDER — HALOPERIDOL LACTATE 2 MG/ML PO CONC: 1.0000 mg | Freq: Two times a day (BID) | ORAL | 0 refills | Status: AC

## 2017-10-18 NOTE — Telephone Encounter (Signed)
I spoke with Sam with Hospice, pt went to ER last night was very combative and agitated. Nurse Sam requesting to change Ativan 0.5 mg to take every 4 hours as needed and possibly adding Haldol to current medication regimen. Per Dr. Sarajane Jews okay to go with change on Ativan requested and add Haldol 2 mg/ml take 1/2 ml bid. Pt still taking Hydrocodone every 4 hours. ( [facility] [address] ) I called in [facility] to [facility] and went over this information with Sam.

## 2017-10-18 NOTE — Telephone Encounter (Signed)
I agree with these recommendations.

## 2017-10-18 NOTE — Telephone Encounter (Signed)
Ativan was changed today to read take every 4 hours as needed, if Dr. Sarajane Jews approves just need to change directions on new script.

## 2017-10-18 NOTE — Telephone Encounter (Signed)
Copied from [place] [phone]. Topic: Quick Communication - See Telephone Encounter >> Oct 18, 2017 11:50 AM Ahmed Prima L wrote: CRM for notification. See Telephone encounter for:   10/18/17.   Thomes Dinning from Hospice needs the nurse to call him back. She was in the hospital last night.  Call back is [phone]. He said this is urgent. Please call back

## 2017-10-18 NOTE — Telephone Encounter (Signed)
Copied from [place] [id number]. Topic: Quick Communication - Rx Refill/Question >> Oct 18, 2017  2:08 PM Arletha Grippe wrote: Medication: LORazepam (ATIVAN) 0.5 MG tablet      Has the patient contacted their pharmacy? No.   (Agent: If no, request that the patient contact the pharmacy for the refill.)   Preferred Pharmacy (with phone number [facility] street [facility] [address]  Sam from hospice called to see if this can be called in.  He just spoke to cma, forgot to ask her.   Family will be going soon to the pharmacy to pick up Pt only has 4 pills left     Agent: Please be advised that RX refills may take up to 3 business days. We ask that you follow-up with your pharmacy.

## 2017-10-19 ENCOUNTER — Other Ambulatory Visit: Payer: Self-pay | Admitting: Family Medicine

## 2017-10-19 ENCOUNTER — Other Ambulatory Visit: Payer: Self-pay

## 2017-10-19 MED ORDER — LORAZEPAM 0.5 MG PO TABS: 0.5000 mg | ORAL_TABLET | ORAL | 2 refills | Status: AC | PRN

## 2017-10-19 NOTE — Telephone Encounter (Signed)
Sent to PCP needs doctor's approval for the change in directions. If approved will need to call in a new Rx for the pt.

## 2017-10-19 NOTE — Telephone Encounter (Signed)
Sam From Hospice called again today for update on the rx.  He states the pt is not doing well. I spoke with Sunday Spillers at office and passed his message on to her. He is requesting a call back at [phone]

## 2017-10-19 NOTE — Telephone Encounter (Signed)
Called in RX for ativan with new disp amount and directions. Called and spoke with Sam. Sam is aware. Sam stated that the pt is dying, BP is OK, pt is just very aggravate and irritable.

## 2017-10-19 NOTE — Telephone Encounter (Signed)
I approve these changes

## 2017-10-19 NOTE — Telephone Encounter (Signed)
Done

## 2017-10-19 NOTE — Telephone Encounter (Signed)
Bianca Matthews did you already address this issues? OK to close this out?

## 2017-10-19 NOTE — Telephone Encounter (Signed)
No, pt needed a refill and nurse requesting a return call.

## 2017-11-02 DEATH — deceased

## 2017-11-07 ENCOUNTER — Ambulatory Visit: Payer: Medicare PPO | Admitting: Podiatry

## 2019-01-11 IMAGING — CR DG CHEST 2V
2 series · 2 of 2 positions shown · non-contrast
Comparison: Chest radiograph dated 11/03/2016

CLINICAL DATA: 89-year-old female with altered mental status.

EXAM:
CHEST  2 VIEW

[w chest pa]
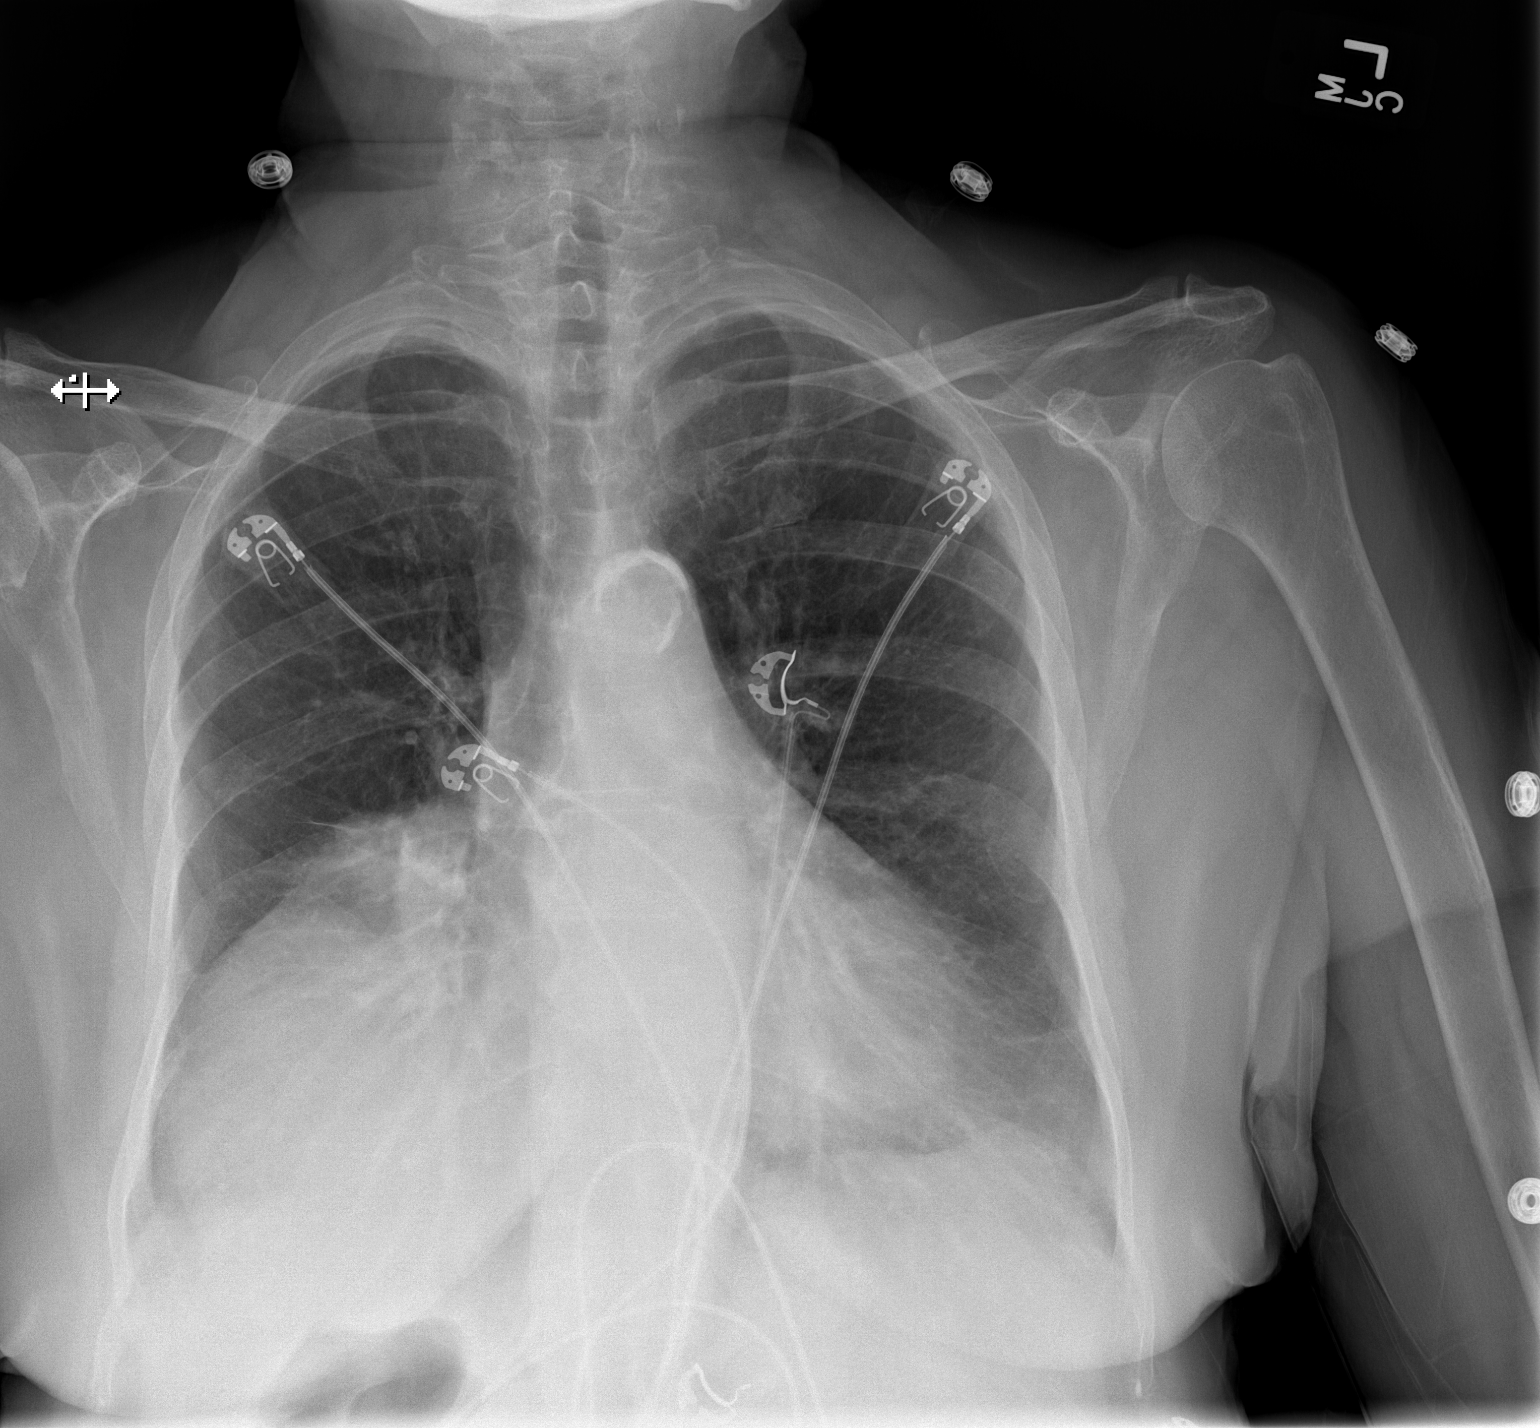

[w chest lat]
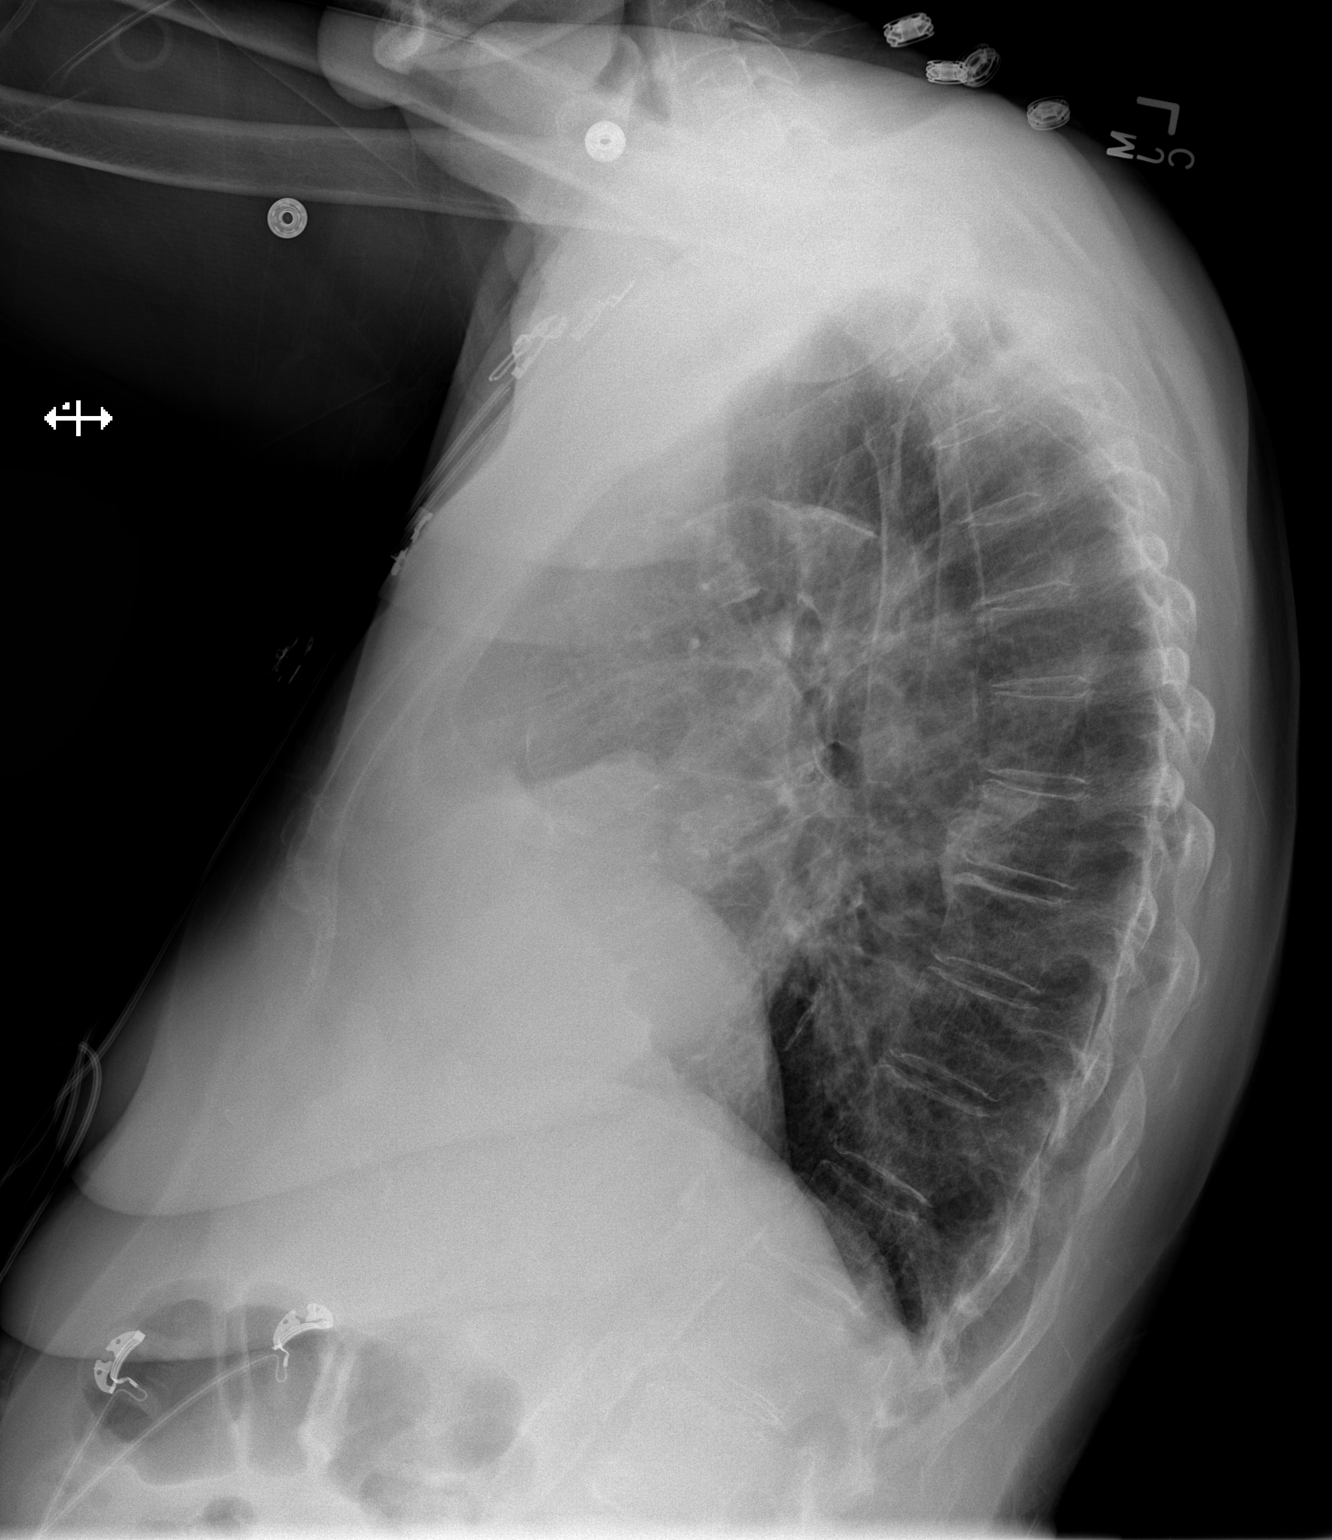

[2 of 2 positions shown; findings below may reference images not displayed]

FINDINGS: There is mild eventration of the right hemidiaphragm. There
bibasilar atelectatic changes. No focal consolidation, pleural
effusion, or pneumothorax. The cardiac silhouette is within normal
limits. There is atherosclerotic calcification of the aorta. No
acute osseous pathology.
IMPRESSION: No acute cardiopulmonary process.  No interval change.

## 2019-04-16 ENCOUNTER — Telehealth: Payer: Self-pay | Admitting: Cardiovascular Disease

## 2019-04-16 NOTE — Telephone Encounter (Signed)
lm 04-16-19 for recalls

## 2019-06-09 IMAGING — CT CT L SPINE W/O CM
3 of 8 series · 10 of 33 positions shown, 12 images · non-contrast
Comparison: Office lumbar radiographs 08/01/2017. Abdominal CT
06/13/2005.

CLINICAL DATA: Low back pain for several years. No acute injury,
prior relevant surgery or history of malignancy.

EXAM:
CT LUMBAR SPINE WITHOUT CONTRAST
TECHNIQUE: Multidetector CT imaging of the lumbar spine was performed without
intravenous contrast administration. Multiplanar CT image
reconstructions were also generated.

[Series 2: l spine soft · axial · 0.31mm/px · z∈[+1116,+1230]mm · 2 of 90 slices shown, 3 images]
[im 26/90  soft-tissue]
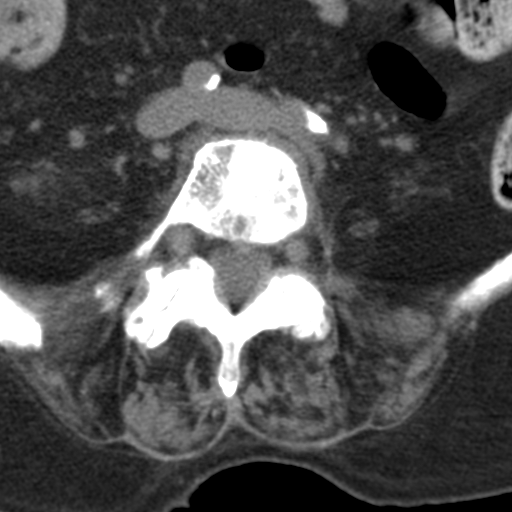
[im 26/90  bone]
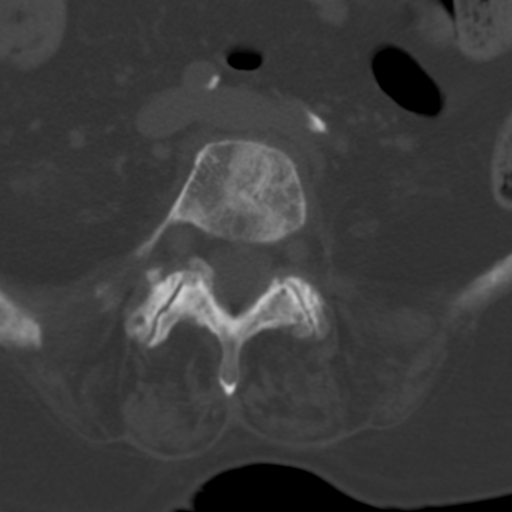
[im 64/90  bone]
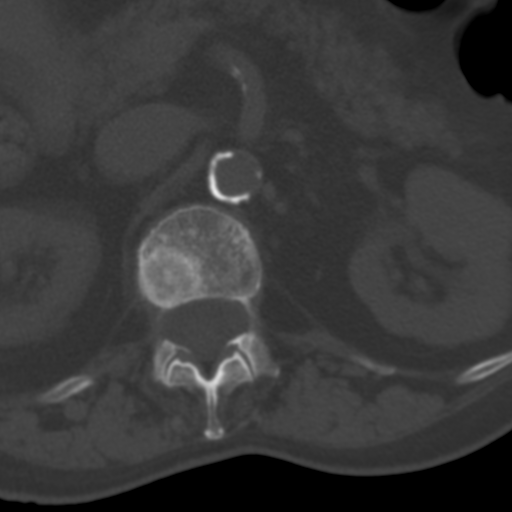

[Series 6: bone cor · coronal · 0.30mm/px · 3 of 47 slices shown]
[im 10/47  bone]
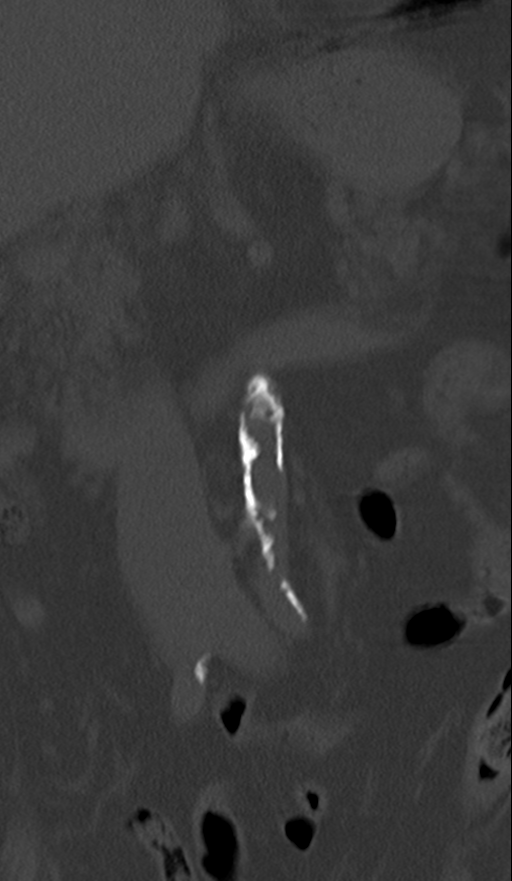
[im 19/47  bone]
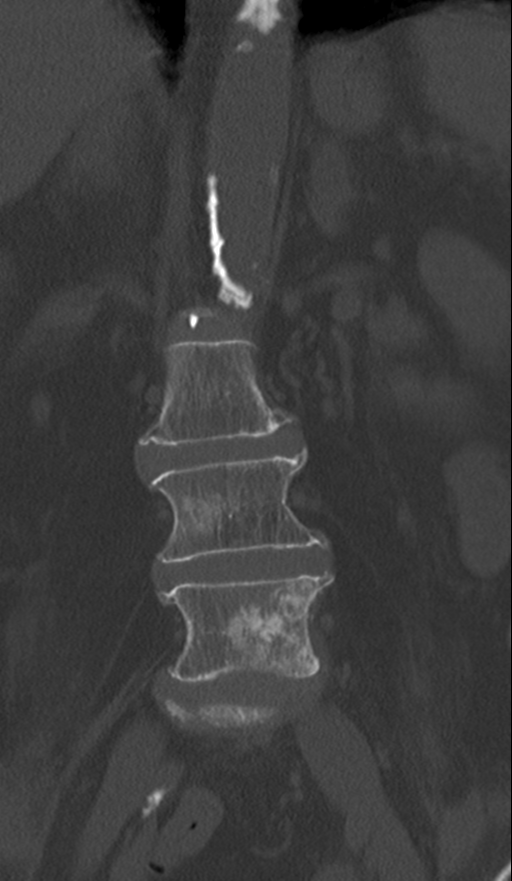
[im 28/47  bone]
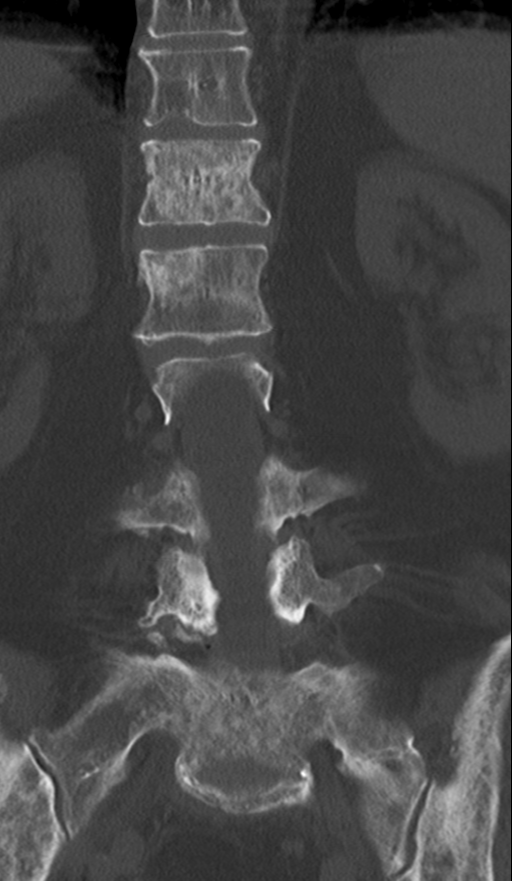

[Series 7: sag bone · sagittal · 0.30mm/px · 5 of 53 slices shown, 6 images]
[im 18/53  bone]
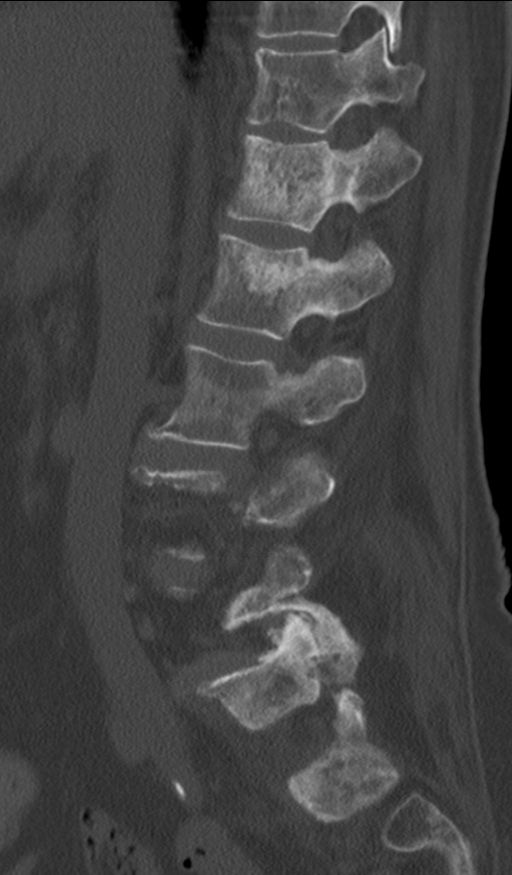
[im 22/53  bone]
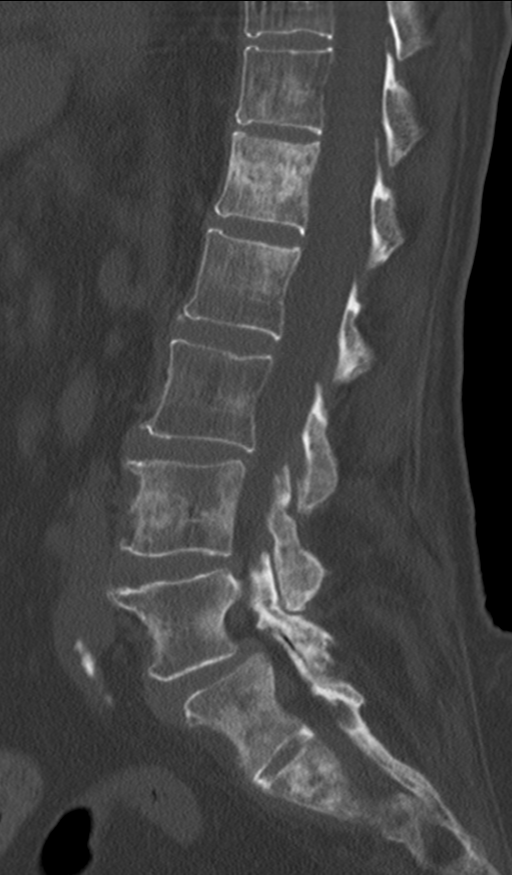
[im 27/53  soft-tissue]
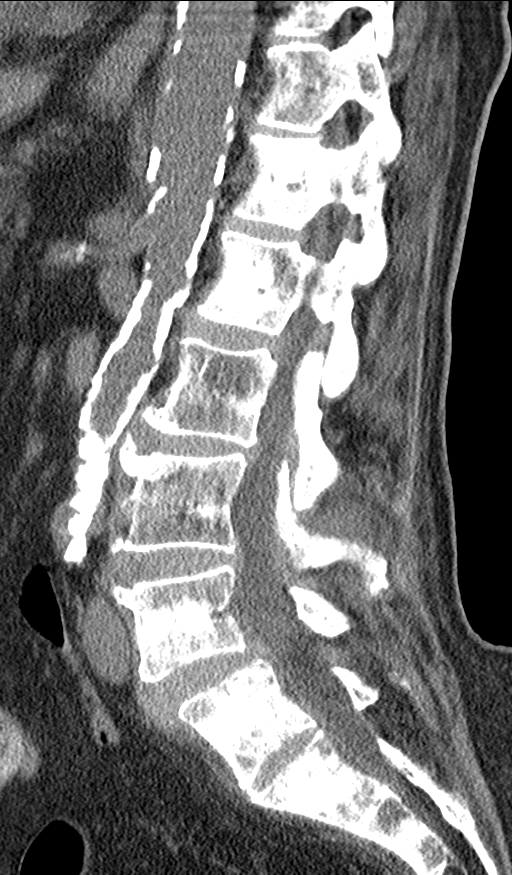
[im 27/53  bone]
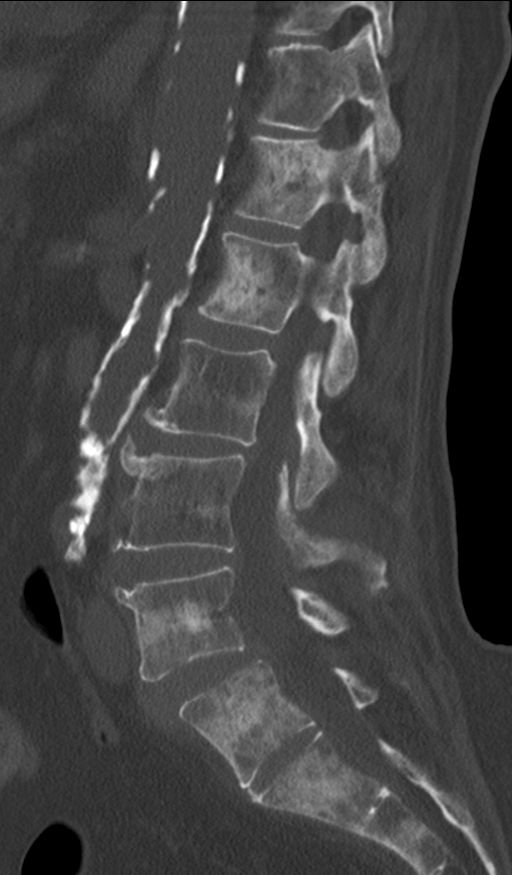
[im 31/53  bone]
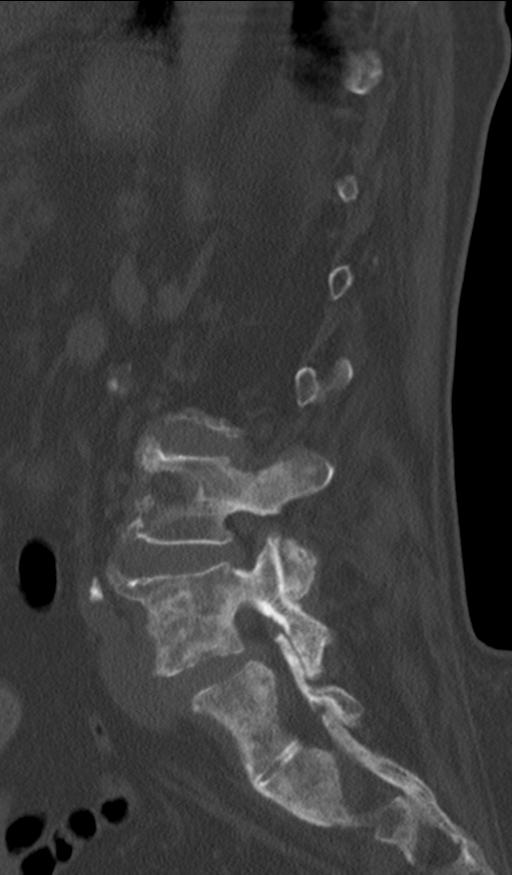
[im 35/53  bone]
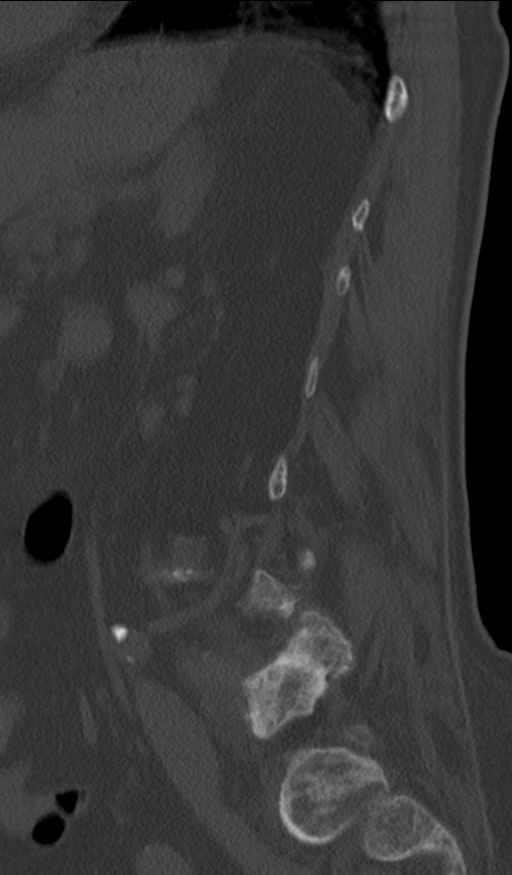

[10 of 33 positions shown; findings below may reference images not displayed]

FINDINGS: Segmentation: Transitional lumbosacral anatomy. The lower ribs are
difficult to visualize on prior chest radiographs. For the purposes
of this report, it is assumed that the transitional segment is L5,
partially sacralized.

Alignment: 6 mm of degenerative anterolisthesis at L4-5. Otherwise
normal.

Vertebrae: There are numerous sclerotic lesions throughout the
lumbar spine, ribs and pelvis which are new from the prior abdominal
CT, highly worrisome for blastic metastatic disease. These are most
prominent at T12, L1, L3, L4 and L5. No lytic lesion or pathologic
fracture identified. There is some periosteal reaction surrounding
the right L3 transverse process. Mild sacroiliac degenerative
changes bilaterally.

Paraspinal and other soft tissues: No paraspinal masses or
retroperitoneal lymphadenopathy demonstrated. There is extensive
aortic and branch vessel atherosclerosis. There is atelectasis at
the lung bases.

Disc levels:

No significant disc space findings are seen from T10-11 through
L1-2.

L2-3: Mild disc bulging. No spinal stenosis or nerve root
encroachment.

L3-4: Disc bulging with facet and ligamentous hypertrophy. There is
mild triangulation of the thecal sac with mild narrowing of the left
lateral recess. No foraminal compromise.

L4-5: Advanced facet disease accounting for the grade 1
anterolisthesis. There is loss of disc height with annular disc
bulging eccentric to the right. These factors contribute to mild
spinal stenosis and mild narrowing of the right lateral recess and
right foramen.

L5-S1: As numbered, this is a transitional disc space level without
acquired abnormality.
IMPRESSION: 1. Transitional lumbosacral anatomy. L5 is assigned the transitional
segment.
2. Degenerative anterolisthesis at L4-5 contributing to mild
multifactorial spinal stenosis and mild narrowing of the right
lateral recess and right foramen.
3. No other significant spinal stenosis or nerve root encroachment.
4. New sclerotic lesions throughout the visualized spine, ribs and
bony pelvis worrisome for metastatic disease. Breast cancer would be
the most likely explanation. Correlation with mammography
recommended if not recently performed. Bone scan may be helpful to
ascertain the extent of osseous metastatic disease.
5. These results will be called to the ordering clinician or
representative by the Radiologist Assistant, and communication
documented in the PACS or zVision Dashboard.
